# Patient Record
Sex: Female | Born: 1990
Health system: Southern US, Community
[De-identification: ages and names within clinical notes are randomized; demographics above are authoritative.]

## PROBLEM LIST (undated history)

## (undated) ENCOUNTER — Inpatient Hospital Stay: Payer: Self-pay

## (undated) DIAGNOSIS — D649 Anemia, unspecified: Secondary | ICD-10-CM

## (undated) DIAGNOSIS — O039 Complete or unspecified spontaneous abortion without complication: Secondary | ICD-10-CM

## (undated) HISTORY — DX: Anemia, unspecified: D64.9

## (undated) HISTORY — PX: WISDOM TOOTH EXTRACTION: SHX21

## (undated) HISTORY — DX: Complete or unspecified spontaneous abortion without complication: O03.9

---
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
## 2010-08-03 ENCOUNTER — Emergency Department: Payer: Self-pay | Admitting: Emergency Medicine

## 2010-08-05 ENCOUNTER — Emergency Department: Payer: Self-pay | Admitting: Internal Medicine

## 2011-05-28 ENCOUNTER — Emergency Department: Payer: Self-pay

## 2011-05-28 LAB — PREGNANCY, URINE: Pregnancy Test, Urine: NEGATIVE m[IU]/mL

## 2012-07-23 ENCOUNTER — Emergency Department: Payer: Self-pay | Admitting: Emergency Medicine

## 2012-07-23 LAB — URINALYSIS, COMPLETE
Bacteria: NONE SEEN
Ketone: NEGATIVE
Nitrite: NEGATIVE
Ph: 9 (ref 4.5–8.0)
Protein: NEGATIVE
RBC,UR: 1 /HPF (ref 0–5)
Specific Gravity: 1.019 (ref 1.003–1.030)
Squamous Epithelial: NONE SEEN
WBC UR: 8 /HPF (ref 0–5)

## 2013-11-05 ENCOUNTER — Emergency Department: Payer: Self-pay | Admitting: Emergency Medicine

## 2013-11-05 LAB — CBC
HCT: 37.6 % (ref 35.0–47.0)
HGB: 12.4 g/dL (ref 12.0–16.0)
MCH: 31.2 pg (ref 26.0–34.0)
MCHC: 32.9 g/dL (ref 32.0–36.0)
MCV: 95 fL (ref 80–100)
Platelet: 255 10*3/uL (ref 150–440)
RBC: 3.97 10*6/uL (ref 3.80–5.20)
RDW: 12.7 % (ref 11.5–14.5)
WBC: 9.3 10*3/uL (ref 3.6–11.0)

## 2013-11-05 LAB — HCG, QUANTITATIVE, PREGNANCY: Beta Hcg, Quant.: 37768 m[IU]/mL — ABNORMAL HIGH

## 2013-12-22 ENCOUNTER — Encounter: Payer: Self-pay | Admitting: Maternal & Fetal Medicine

## 2014-04-24 DIAGNOSIS — O039 Complete or unspecified spontaneous abortion without complication: Secondary | ICD-10-CM

## 2014-04-24 HISTORY — DX: Complete or unspecified spontaneous abortion without complication: O03.9

## 2014-05-03 ENCOUNTER — Inpatient Hospital Stay: Payer: Self-pay

## 2014-07-17 LAB — SURGICAL PATHOLOGY

## 2014-08-01 NOTE — H&P (Signed)
L&D Evaluation:  History Expanded:  HPI 24 yo G1P0 at 2332 weeks, Prenatal Care at [facility], no compl this pregnancy, denies recent trauma/infection/drug use/hypertension, presents w contraction type pain and no FHR by doppler or Ultrasound x2 examiners.  No vaginal bleeding, ROM, d/c, fever.  Patient does have h/o marijuana use in early pregnancy, denies recent use.  Patient worked today and had no fall or other problem or difference today. Patient and significant other visibly upset by news.   Blood Type (Maternal) O positive   Group B Strep Results Maternal (Result >5wks must be treated as unknown) unknown/result > 5 weeks ago   Presents with contractions   Patient's Medical History iron def anemia   Patient's Surgical History none   Medications Pre Natal Vitamins   Allergies NKDA   Social History past MJ use reported and on early drug screen   Family History Non-Contributory   ROS:  ROS All systems were reviewed.  HEENT, CNS, GI, GU, Respiratory, CV, Renal and Musculoskeletal systems were found to be normal.   Exam:  Vital Signs stable  100/60   General no apparent distress   Mental Status upset and in pain   Abdomen gravid, tender with contractions   Estimated Fetal Weight Average for gestational age   Back no CVAT   Edema no edema   Pelvic no external lesions, 8/C/BBOW, Vtx   Mebranes Intact   FHT ABSENT, by doppler and by Ultrasound   Ucx irregular, every 5-10 min   Skin dry   Impression:  Impression IUFD 32 weeks, no immediately known risk factor or etiology   Plan:  Comments Pt is counseled on the concerns relating to fetal loss. While some losses are readily explainable by immediate circumstances or symptoms, often times it is difficult to assess the true cause of a fetal demise.  She will be offered testing to further ellicit the etiology of her fetal demise, to include blood tests, urine tests, placental exam and testing, fetal cultures, and autopsy.   She will be managed with appropriate medications to facilitate delivery in a safe and humane fashion.  Every effort will be made to provide a quiet, peaceful, and compassionate transition.   Electronic Signatures: Letitia LibraHarris, Kersten Salmons Paul (MD)  (Signed 10-Feb-16 22:56)  Authored: L&D Evaluation   Last Updated: 10-Feb-16 22:56 by Letitia LibraHarris, Ronan Duecker Paul (MD)

## 2015-01-15 ENCOUNTER — Ambulatory Visit (INDEPENDENT_AMBULATORY_CARE_PROVIDER_SITE_OTHER): Payer: 59 | Admitting: Family Medicine

## 2015-01-15 ENCOUNTER — Encounter: Payer: Self-pay | Admitting: Family Medicine

## 2015-01-15 VITALS — BP 106/71 | HR 76 | Temp 99.1°F | Ht 64.4 in | Wt 130.0 lb

## 2015-01-15 DIAGNOSIS — Z8759 Personal history of other complications of pregnancy, childbirth and the puerperium: Secondary | ICD-10-CM | POA: Insufficient documentation

## 2015-01-15 DIAGNOSIS — Z13220 Encounter for screening for lipoid disorders: Secondary | ICD-10-CM

## 2015-01-15 DIAGNOSIS — Z8742 Personal history of other diseases of the female genital tract: Secondary | ICD-10-CM

## 2015-01-15 DIAGNOSIS — N912 Amenorrhea, unspecified: Secondary | ICD-10-CM

## 2015-01-15 DIAGNOSIS — D649 Anemia, unspecified: Secondary | ICD-10-CM

## 2015-01-15 HISTORY — DX: Anemia, unspecified: D64.9

## 2015-01-15 NOTE — Assessment & Plan Note (Signed)
Was anemic when she was pregnant. Will check CBC and iron studies today and treat as needed. Has also been very fatigued- will check CMP and TSH. Await results.

## 2015-01-15 NOTE — Assessment & Plan Note (Signed)
Will send to GYN. Concerned about ability to have children and hormone levels as well as recurrent BV. Referral generated today.

## 2015-01-15 NOTE — Progress Notes (Signed)
BP 106/71 mmHg  Pulse 76  Temp(Src) 99.1 F (37.3 C)  Ht 5' 4.4" (1.636 m)  Wt 130 lb (58.968 kg)  BMI 22.03 kg/m2  SpO2 98%  LMP 12/08/2014 (Approximate)   Subjective:    Patient ID: Brittany Page, female    DOB: 1990/12/31, 24 y.o.   MRN: [id number]  HPI: Brittany Page is a 24 y.o. female who presents today to establish care.   Chief Complaint  Patient presents with  . Fatigue    Low Hemoglobin  . Menstrual Problem    Patient is concerned about her hormones, she had a vey late term miscarriage. She has not had had regular periods since being on birth control.   . Vaginal Discharge    Patient has recurrent BV, she is wondering if she could be allergic to her boyfriend's sperm   ANEMIA- has been dealing with it for a long time. Has pretty heavy periods, has to go home because she bled through everything.  Anemia status: stable Etiology of anemia: unknown Duration of anemia treatment: Not on anything Iron supplementation side effects: no Severity of anemia: unknown Fatigue: yes Decreased exercise tolerance: no  Dyspnea on exertion: yes Palpitations: yes Bleeding: yes Pica: no  Lost a baby at 32 weeks in February. Very concerned about whether she will be able to have a baby in the future. She is concerned about her hormones. States that she has not been sexually active because she thinks she is allergic to her boyfriend's semen, but has not had a period in over a month. She states that her periods have been regular since the miscarriage, but were good when she was on birth control. Irregular without the OCP. Not currently on birth control and not using protection when she does have sex. HIV screening just done at the health department.   Last pap smear in March- through the health department  Relevant past medical, surgical, family and social history reviewed and updated as indicated. Interim medical history since our last visit reviewed. Allergies and medications reviewed and  updated.  Review of Systems  Constitutional: Negative.   Respiratory: Negative.   Cardiovascular: Negative.   Gastrointestinal: Negative.   Genitourinary: Negative.   Skin: Negative.   Psychiatric/Behavioral: Negative.     Per HPI unless specifically indicated above     Objective:    BP 106/71 mmHg  Pulse 76  Temp(Src) 99.1 F (37.3 C)  Ht 5' 4.4" (1.636 m)  Wt 130 lb (58.968 kg)  BMI 22.03 kg/m2  SpO2 98%  LMP 12/08/2014 (Approximate)  Wt Readings from Last 3 Encounters:  01/15/15 130 lb (58.968 kg)    Physical Exam  Constitutional: She is oriented to person, place, and time. She appears well-developed and well-nourished. No distress.  HENT:  Head: Normocephalic and atraumatic.  Right Ear: Hearing normal.  Left Ear: Hearing normal.  Nose: Nose normal.  Eyes: Conjunctivae and lids are normal. Right eye exhibits no discharge. Left eye exhibits no discharge. No scleral icterus.  Cardiovascular: Normal rate, regular rhythm, normal heart sounds and intact distal pulses.  Exam reveals no gallop and no friction rub.   No murmur heard. Pulmonary/Chest: Effort normal and breath sounds normal. No respiratory distress. She has no wheezes. She has no rales. She exhibits no tenderness.  Abdominal: Bowel sounds are normal. She exhibits no distension and no mass. There is no tenderness. There is no rebound and no guarding.  Musculoskeletal: Normal range of motion.  Neurological: She is alert and oriented  to person, place, and time.  Skin: Skin is warm, dry and intact. No rash noted. No erythema. No pallor.  Psychiatric: She has a normal mood and affect. Her speech is normal and behavior is normal. Judgment and thought content normal. Cognition and memory are normal.  Nursing note and vitals reviewed.   Results for orders placed or performed in visit on 05/03/14  Surgical pathology  Result Value Ref Range   SURGICAL PATHOLOGY      Surgical Procedure CASE: [id number] PATIENT:  Brittany Page Surgical Pathology Report     SPECIMEN SUBMITTED: A. Placenta, third trimester  CLINICAL HISTORY: None provided  PRE-OPERATIVE DIAGNOSIS: 32 week IUFD  POST-OPERATIVE DIAGNOSIS: None provided     DIAGNOSIS: A. PLACENTA, THIRD TRIMESTER; VAGINAL DELIVERY: - PALE, THIRD TRIMESTER DISCOID PLACENTA ASSOCIATED WITH INTRAUTERINE FETAL DEMISE. 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 - WEIGHT 304 G, APPROXIMATELY 15th PERCENTILE FOR GESTATIONAL AGE OF [redacted] WEEKS. - THREE VESSEL UMBILICAL CORD WITH ECCENTRIC INSERTION. - FOCAL ACUTE CHORIOAMNIONITIS.  Comment: Fetal thrombotic vasculopathy can be associated with cord abnormalities and intermittent cord obstruction. In this case, the hyper-coiled umbilical cord most likely led to compromised vascular flow in the umbilical vessels.   GROSS DESCRIPTION: A. The specimen is received in a formalin-fil led container labeled with the patient's name and placenta.  Weight: 304 grams Dimensions: 16 x 14.5 x 2.5 cm Shape: Roughly circular Accessory lobes: None Membranes: Pink purple to tan slightly cloudy and thickened. There is a circular thickened tan brown raised area in the membranes that measures 2.8 x 2 cm in greatest dimensions, of unknown significance. Umbilical cord:      Length -48 cm in length x 0.8-1.5 cm in diameter      Number of vessels -3      Insertion -eccentric      Distance of insertion from margin -5 cm      Other findings -the umbilical cord is hypercoiled markedly discolored purple and congested for its entire length Fetal surface: Gray purple with a vague small amount of green discolorationo Meconium staining Maternal surface: Intact with a minimal amount of adherent blood clot. Comments: Multiple sections through the placental parenchyma reveals a pale pink appearance with no masses grossly noted  Block summary: 1 - umbilical cord 2 -  memb rane rolls 3-4 placental parenchyma    Final Diagnosis performed by Elijah Birkara Rubinas, MD.  Electronically signed 05/05/2014 11:06:38AM    The electronic signature indicates that the named Attending Pathologist has evaluated the specimen  Technical component performed at [facility], [address], [place], [place] [address] Lab: [phone] Dir: Titus DubinWilliam F. Cato MulliganHancock, MD  Professional component performed at [facility], [facility], [address], [place], [place] [address] Lab: [phone] Dir: Georgiann Cockerara C. Oneita Krasubinas, MD        Assessment & Plan:   Problem List Items Addressed This Visit      Other   Anemia - Primary    Was anemic when she was pregnant. Will check CBC and iron studies today and treat as needed. Has also been very fatigued- will check CMP and TSH. Await results.       Relevant Orders   CBC With Differential/Platelet   Comprehensive metabolic panel   Iron and TIBC   Ferritin   Miscarriage within last 12 months    Will send to GYN. Concerned about ability to have children and hormone levels as well as recurrent BV. Referral generated today.  Relevant Orders   Comprehensive metabolic panel   Ambulatory referral to Gynecology    Other Visit Diagnoses    Amenorrhea        Urine pregnancy negative today.     Relevant Orders    Comprehensive metabolic panel    TSH    Pregnancy, urine    UA/M w/rflx Culture, Routine    Screening for cholesterol level        Will check levels today. Non-fasting, if quite elevated, will recheck when she is fasting.     Relevant Orders    Lipid Panel w/o Chol/HDL Ratio        Follow up plan: Return 2-3 months PE, for Records release health department.

## 2015-01-16 LAB — COMPREHENSIVE METABOLIC PANEL
ALT: 12 IU/L (ref 0–32)
AST: 14 IU/L (ref 0–40)
Albumin/Globulin Ratio: 1.3 (ref 1.1–2.5)
Albumin: 4.3 g/dL (ref 3.5–5.5)
Alkaline Phosphatase: 62 IU/L (ref 39–117)
BUN/Creatinine Ratio: 15 (ref 8–20)
BUN: 11 mg/dL (ref 6–20)
Bilirubin Total: 0.2 mg/dL (ref 0.0–1.2)
CALCIUM: 9.3 mg/dL (ref 8.7–10.2)
CO2: 22 mmol/L (ref 18–29)
CREATININE: 0.74 mg/dL (ref 0.57–1.00)
Chloride: 102 mmol/L (ref 97–106)
GFR calc Af Amer: 130 mL/min/{1.73_m2} (ref >59)
GFR, EST NON AFRICAN AMERICAN: 113 mL/min/{1.73_m2} (ref >59)
Globulin, Total: 3.2 g/dL (ref 1.5–4.5)
Glucose: 88 mg/dL (ref 65–99)
Potassium: 3.9 mmol/L (ref 3.5–5.2)
SODIUM: 143 mmol/L (ref 136–144)
Total Protein: 7.5 g/dL (ref 6.0–8.5)

## 2015-01-16 LAB — LIPID PANEL W/O CHOL/HDL RATIO
CHOLESTEROL TOTAL: 150 mg/dL (ref 100–199)
HDL: 52 mg/dL (ref >39)
LDL Calculated: 71 mg/dL (ref 0–99)
Triglycerides: 135 mg/dL (ref 0–149)
VLDL CHOLESTEROL CAL: 27 mg/dL (ref 5–40)

## 2015-01-16 LAB — TSH: TSH: 0.738 u[IU]/mL (ref 0.450–4.500)

## 2015-01-16 LAB — CBC WITH DIFFERENTIAL/PLATELET
HEMATOCRIT: 37.6 % (ref 34.0–46.6)
HEMOGLOBIN: 13.2 g/dL (ref 11.1–15.9)
LYMPHS ABS: 3.8 10*3/uL — AB (ref 0.7–3.1)
Lymphs: 40 %
MCH: 32.8 pg (ref 26.6–33.0)
MCHC: 35.1 g/dL (ref 31.5–35.7)
MCV: 93 fL (ref 79–97)
MID (Absolute): 0.9 10*3/uL (ref 0.1–1.6)
MID: 10 %
NEUTROS PCT: 50 %
Neutrophils Absolute: 4.7 10*3/uL (ref 1.4–7.0)
Platelets: 239 10*3/uL (ref 150–379)
RBC: 4.03 x10E6/uL (ref 3.77–5.28)
RDW: 44.9 % — ABNORMAL HIGH (ref 12.3–15.4)
WBC: 9.4 10*3/uL (ref 3.4–10.8)

## 2015-01-16 LAB — FERRITIN: Ferritin: 101 ng/mL (ref 15–150)

## 2015-01-16 LAB — PREGNANCY, URINE: PREG TEST UR: NEGATIVE

## 2015-01-16 LAB — IRON AND TIBC
IRON: 51 ug/dL (ref 27–159)
Iron Saturation: 18 % (ref 15–55)
Total Iron Binding Capacity: 285 ug/dL (ref 250–450)
UIBC: 234 ug/dL (ref 131–425)

## 2015-01-16 LAB — UA/M W/RFLX CULTURE, ROUTINE
BILIRUBIN UA: NEGATIVE
GLUCOSE, UA: NEGATIVE
Leukocytes, UA: NEGATIVE
NITRITE UA: NEGATIVE
RBC UA: NEGATIVE
SPEC GRAV UA: 1.015 (ref 1.005–1.030)
Urobilinogen, Ur: 1 mg/dL (ref 0.2–1.0)
pH, UA: 8.5 — ABNORMAL HIGH (ref 5.0–7.5)

## 2015-01-16 LAB — MICROSCOPIC EXAMINATION

## 2015-01-17 ENCOUNTER — Telehealth: Payer: Self-pay | Admitting: Family Medicine

## 2015-01-17 NOTE — Telephone Encounter (Signed)
Please let Brittany Page know that her labs all came back nice and normal. Nothing to worry about. Her urine pH was a little off- which might be why she keeps getting BV, she should drink a bit more acidic beverages (juice, lemonade) and that might help

## 2015-01-17 NOTE — Telephone Encounter (Signed)
Patient notified

## 2015-02-05 ENCOUNTER — Telehealth: Payer: Self-pay | Admitting: Family Medicine

## 2015-02-05 NOTE — Telephone Encounter (Signed)
Pt would like to come in regarding vaginal itching. She thinks it may be a yeast infection but is unsure. appt scheduled tomorrow @4 .

## 2015-02-06 ENCOUNTER — Ambulatory Visit (INDEPENDENT_AMBULATORY_CARE_PROVIDER_SITE_OTHER): Payer: 59 | Admitting: Family Medicine

## 2015-02-06 ENCOUNTER — Encounter: Payer: Self-pay | Admitting: Family Medicine

## 2015-02-06 VITALS — BP 117/79 | HR 79 | Temp 98.1°F | Ht 64.6 in | Wt 133.0 lb

## 2015-02-06 DIAGNOSIS — L298 Other pruritus: Secondary | ICD-10-CM

## 2015-02-06 DIAGNOSIS — N760 Acute vaginitis: Secondary | ICD-10-CM

## 2015-02-06 DIAGNOSIS — B9689 Other specified bacterial agents as the cause of diseases classified elsewhere: Secondary | ICD-10-CM

## 2015-02-06 DIAGNOSIS — A499 Bacterial infection, unspecified: Secondary | ICD-10-CM

## 2015-02-06 DIAGNOSIS — N898 Other specified noninflammatory disorders of vagina: Secondary | ICD-10-CM

## 2015-02-06 MED ORDER — METRONIDAZOLE 500 MG PO TABS: 500.0000 mg | ORAL_TABLET | Freq: Two times a day (BID) | ORAL | Status: DC

## 2015-02-06 MED ORDER — CVS PROBIOTIC PO CAPS: ORAL_CAPSULE | ORAL | Status: DC

## 2015-02-06 NOTE — Progress Notes (Signed)
BP 117/79 mmHg  Pulse 79  Temp(Src) 98.1 F (36.7 C)  Ht 5' 4.6" (1.641 m)  Wt 133 lb (60.328 kg)  BMI 22.40 kg/m2  SpO2 97%  LMP 12/08/2014 (Approximate)   Subjective:    Patient ID: Brittany Page, female    DOB: 09/10/1990, 23 y.o.   MRN: [id number]  HPI: Brittany Page is a 24 y.o. female  Chief Complaint  Patient presents with  . Vaginal Itching   VAGINAL itching Duration: couple of weeks Discharge description: none  Pruritus: yes Dysuria: no Malodorous: no Urinary frequency: no Fevers: no Abdominal pain: no  Sexual activity: practicing safe sex History of sexually transmitted diseases: no Recent antibiotic use: no Context: recurrent BV  Treatments attempted: none  Relevant past medical, surgical, family and social history reviewed and updated as indicated. Interim medical history since our last visit reviewed. Allergies and medications reviewed and updated.  Review of Systems  Constitutional: Negative.   Respiratory: Negative.   Cardiovascular: Negative.   Gastrointestinal: Negative.   Genitourinary: Negative.   Psychiatric/Behavioral: Negative.     Per HPI unless specifically indicated above     Objective:    BP 117/79 mmHg  Pulse 79  Temp(Src) 98.1 F (36.7 C)  Ht 5' 4.6" (1.641 m)  Wt 133 lb (60.328 kg)  BMI 22.40 kg/m2  SpO2 97%  LMP 12/08/2014 (Approximate)  Wt Readings from Last 3 Encounters:  02/06/15 133 lb (60.328 kg)  01/15/15 130 lb (58.968 kg)    Physical Exam  Constitutional: She is oriented to person, place, and time. She appears well-developed and well-nourished. No distress.  HENT:  Head: Normocephalic and atraumatic.  Right Ear: Hearing normal.  Left Ear: Hearing normal.  Nose: Nose normal.  Eyes: Conjunctivae and lids are normal. Right eye exhibits no discharge. Left eye exhibits no discharge. No scleral icterus.  Pulmonary/Chest: Effort normal. No respiratory distress.  Abdominal: Soft. Bowel sounds are normal. She  exhibits no distension and no mass. There is no tenderness. There is no rebound and no guarding.  Genitourinary: Uterus normal. No labial fusion. There is no rash, tenderness, lesion or injury on the right labia. There is no rash, tenderness, lesion or injury on the left labia. There is erythema and tenderness in the vagina. No bleeding in the vagina. No foreign body around the vagina. No signs of injury around the vagina. Vaginal discharge found.  Musculoskeletal: Normal range of motion.  Neurological: She is alert and oriented to person, place, and time.  Skin: Skin is warm, dry and intact. No rash noted. No erythema. No pallor.  Psychiatric: She has a normal mood and affect. Her speech is normal and behavior is normal. Judgment and thought content normal. Cognition and memory are normal.  Nursing note and vitals reviewed.   Results for orders placed or performed in visit on 01/15/15  Microscopic Examination  Result Value Ref Range   WBC, UA 0-5 0 -  5 /hpf   RBC, UA 0-2 0 -  2 /hpf   Epithelial Cells (non renal) 0-10 0 - 10 /hpf   Mucus, UA Present (A) Not Estab.   Bacteria, UA Few None seen/Few  CBC With Differential/Platelet  Result Value Ref Range   WBC 9.4 3.4 - 10.8 x10E3/uL   RBC 4.03 3.77 - 5.28 x10E6/uL   Hemoglobin 13.2 11.1 - 15.9 g/dL   Hematocrit 11.9 14.7 - 46.6 %   MCV 93 79 - 97 fL   MCH 32.8 26.6 - 33.0 pg  MCHC 35.1 31.5 - 35.7 g/dL   RDW 09.844.9 (H) 11.912.3 - 14.715.4 %   Platelets 239 150 - 379 x10E3/uL   Neutrophils 50 %   Lymphs 40 %   MID 10 %   Neutrophils Absolute 4.7 1.4 - 7.0 x10E3/uL   Lymphocytes Absolute 3.8 (H) 0.7 - 3.1 x10E3/uL   MID (Absolute) 0.9 0.1 - 1.6 X10E3/uL  Comprehensive metabolic panel  Result Value Ref Range   Glucose 88 65 - 99 mg/dL   BUN 11 6 - 20 mg/dL   Creatinine, Ser 8.290.74 0.57 - 1.00 mg/dL   GFR calc non Af Amer 113 >59 mL/min/1.73   GFR calc Af Amer 130 >59 mL/min/1.73   BUN/Creatinine Ratio 15 8 - 20   Sodium 143 136 - 144  mmol/L   Potassium 3.9 3.5 - 5.2 mmol/L   Chloride 102 97 - 106 mmol/L   CO2 22 18 - 29 mmol/L   Calcium 9.3 8.7 - 10.2 mg/dL   Total Protein 7.5 6.0 - 8.5 g/dL   Albumin 4.3 3.5 - 5.5 g/dL   Globulin, Total 3.2 1.5 - 4.5 g/dL   Albumin/Globulin Ratio 1.3 1.1 - 2.5   Bilirubin Total 0.2 0.0 - 1.2 mg/dL   Alkaline Phosphatase 62 39 - 117 IU/L   AST 14 0 - 40 IU/L   ALT 12 0 - 32 IU/L  Iron and TIBC  Result Value Ref Range   Total Iron Binding Capacity 285 250 - 450 ug/dL   UIBC 562234 130131 - 865425 ug/dL   Iron 51 27 - 784159 ug/dL   Iron Saturation 18 15 - 55 %  Ferritin  Result Value Ref Range   Ferritin 101 15 - 150 ng/mL  Lipid Panel w/o Chol/HDL Ratio  Result Value Ref Range   Cholesterol, Total 150 100 - 199 mg/dL   Triglycerides 696135 0 - 149 mg/dL   HDL 52 >29>39 mg/dL   VLDL Cholesterol Cal 27 5 - 40 mg/dL   LDL Calculated 71 0 - 99 mg/dL  TSH  Result Value Ref Range   TSH 0.738 0.450 - 4.500 uIU/mL  Pregnancy, urine  Result Value Ref Range   Preg Test, Ur Negative Negative  UA/M w/rflx Culture, Routine  Result Value Ref Range   Specific Gravity, UA 1.015 1.005 - 1.030   pH, UA 8.5 (H) 5.0 - 7.5   Color, UA Yellow Yellow   Appearance Ur Clear Clear   Leukocytes, UA Negative Negative   Protein, UA 1+ (A) Negative/Trace   Glucose, UA Negative Negative   Ketones, UA 1+ (A) Negative   RBC, UA Negative Negative   Bilirubin, UA Negative Negative   Urobilinogen, Ur 1.0 0.2 - 1.0 mg/dL   Nitrite, UA Negative Negative   Microscopic Examination See below:       Assessment & Plan:   Problem List Items Addressed This Visit    None    Visit Diagnoses    BV (bacterial vaginosis)    -  Primary    + clue cells. Tx with metronidazole. Rx for probiotic to help with recurrent BV. Call if not getting better or getting worse.     Relevant Medications    metroNIDAZOLE (FLAGYL) 500 MG tablet    Vaginal itching        Will check wet prep    Relevant Orders    WET PREP FOR TRICH,  YEAST, CLUE        Follow up plan: Return if symptoms worsen  or fail to improve.

## 2015-02-07 LAB — WET PREP FOR TRICH, YEAST, CLUE
Clue Cell Exam: POSITIVE — AB
TRICHOMONAS EXAM: NEGATIVE
Yeast Exam: NEGATIVE

## 2015-02-26 ENCOUNTER — Telehealth: Payer: Self-pay

## 2015-02-26 NOTE — Telephone Encounter (Signed)
Received a voicemail from patient that stated that she is still having the symptoms that she was having. She would like advice, she is not able to go to the GYN until January. Spoke with Dr.Johnson she is fine with seeing her on 03/29/14 @4 :15pm.

## 2015-02-27 ENCOUNTER — Encounter: Payer: Self-pay | Admitting: Family Medicine

## 2015-02-27 ENCOUNTER — Encounter: Payer: 59 | Admitting: Certified Nurse Midwife

## 2015-02-27 ENCOUNTER — Ambulatory Visit (INDEPENDENT_AMBULATORY_CARE_PROVIDER_SITE_OTHER): Payer: 59 | Admitting: Family Medicine

## 2015-02-27 VITALS — BP 113/76 | HR 106 | Temp 99.2°F | Wt 131.0 lb

## 2015-02-27 DIAGNOSIS — L298 Other pruritus: Secondary | ICD-10-CM | POA: Diagnosis not present

## 2015-02-27 DIAGNOSIS — A499 Bacterial infection, unspecified: Secondary | ICD-10-CM | POA: Diagnosis not present

## 2015-02-27 DIAGNOSIS — Z113 Encounter for screening for infections with a predominantly sexual mode of transmission: Secondary | ICD-10-CM | POA: Diagnosis not present

## 2015-02-27 DIAGNOSIS — N912 Amenorrhea, unspecified: Secondary | ICD-10-CM | POA: Diagnosis not present

## 2015-02-27 DIAGNOSIS — N760 Acute vaginitis: Secondary | ICD-10-CM

## 2015-02-27 DIAGNOSIS — B9689 Other specified bacterial agents as the cause of diseases classified elsewhere: Secondary | ICD-10-CM

## 2015-02-27 DIAGNOSIS — N898 Other specified noninflammatory disorders of vagina: Secondary | ICD-10-CM

## 2015-02-27 LAB — WET PREP FOR TRICH, YEAST, CLUE
CLUE CELL EXAM: POSITIVE — AB
TRICHOMONAS EXAM: NEGATIVE
YEAST EXAM: NEGATIVE

## 2015-02-27 LAB — MICROSCOPIC EXAMINATION: Epithelial Cells (non renal): >10 /hpf — AB (ref 0–10)

## 2015-02-27 LAB — PREGNANCY, URINE: PREG TEST UR: NEGATIVE

## 2015-02-27 MED ORDER — METRONIDAZOLE 0.75 % VA GEL: 1.0000 | Freq: Two times a day (BID) | VAGINAL | Status: DC

## 2015-02-27 NOTE — Progress Notes (Signed)
BP 113/76 mmHg  Pulse 106  Temp(Src) 99.2 F (37.3 C)  Wt 131 lb (59.421 kg)  SpO2 99%  LMP  (LMP Unknown)   Subjective:    Patient ID: Brittany Page, female    DOB: [birth year], 24 y.o.   MRN: [id number]  HPI: Brittany Page is a 24 y.o. female  Chief Complaint  Patient presents with  . Vaginal Discharge   VAGINAL DISCHARGE Duration: started yesterday at work Discharge description: doesn't see any discharge, but felt running out clear  Pruritus: no Dysuria: no Malodorous: yes Urinary frequency: yes Fevers: no Abdominal pain: no  Sexual activity: concerned about STIs History of sexually transmitted diseases: no Recent antibiotic use: yes Context: better and recurrent BV  Treatments attempted: none  Hasn't had a period in over a month. Had unprotected sex last time. Would like to be tested.  Relevant past medical, surgical, family and social history reviewed and updated as indicated. Interim medical history since our last visit reviewed. Allergies and medications reviewed and updated.  Review of Systems  Constitutional: Negative.   Respiratory: Negative.   Cardiovascular: Negative.   Gastrointestinal: Negative.   Genitourinary: Positive for dysuria and vaginal discharge. Negative for urgency, frequency, hematuria, flank pain, decreased urine volume, vaginal bleeding, enuresis, difficulty urinating, genital sores, vaginal pain, menstrual problem, pelvic pain and dyspareunia.  Psychiatric/Behavioral: Negative.     Per HPI unless specifically indicated above     Objective:    BP 113/76 mmHg  Pulse 106  Temp(Src) 99.2 F (37.3 C)  Wt 131 lb (59.421 kg)  SpO2 99%  LMP  (LMP Unknown)  Wt Readings from Last 3 Encounters:  02/27/15 131 lb (59.421 kg)  02/06/15 133 lb (60.328 kg)  01/15/15 130 lb (58.968 kg)    Physical Exam  Constitutional: She is oriented to person, place, and time. She appears well-developed and well-nourished. No distress.  HENT:  Head:  Normocephalic and atraumatic.  Right Ear: Hearing normal.  Left Ear: Hearing normal.  Nose: Nose normal.  Eyes: Conjunctivae and lids are normal. Right eye exhibits no discharge. Left eye exhibits no discharge. No scleral icterus.  Pulmonary/Chest: Effort normal. No respiratory distress.  Abdominal: Soft. Bowel sounds are normal. She exhibits no distension and no mass. There is no tenderness. There is no rebound and no guarding. Hernia confirmed negative in the right inguinal area and confirmed negative in the left inguinal area.  Genitourinary: Uterus normal. No labial fusion. There is no rash, tenderness, lesion or injury on the right labia. There is no rash, tenderness, lesion or injury on the left labia. No erythema, tenderness or bleeding in the vagina. No foreign body around the vagina. No signs of injury around the vagina. Vaginal discharge found.  Musculoskeletal: Normal range of motion.  Lymphadenopathy:       Right: No inguinal adenopathy present.       Left: No inguinal adenopathy present.  Neurological: She is alert and oriented to person, place, and time.  Skin: Skin is warm, dry and intact. No rash noted. No erythema. No pallor.  Psychiatric: She has a normal mood and affect. Her speech is normal and behavior is normal. Judgment and thought content normal. Cognition and memory are normal.  Nursing note and vitals reviewed.   Results for orders placed or performed in visit on 02/27/15  Microscopic Examination  Result Value Ref Range   WBC, UA 0-5 0 -  5 /hpf   RBC, UA 0-2 0 -  2 /hpf   Epithelial  Cells (non renal) >10 (A) 0 - 10 /hpf   Mucus, UA Present Not Estab.   Bacteria, UA Few None seen/Few  WET PREP FOR TRICH, YEAST, CLUE  Result Value Ref Range   Trichomonas Exam Negative Negative   Yeast Exam Negative Negative   Clue Cell Exam Positive (A) Negative  Pregnancy, urine  Result Value Ref Range   Preg Test, Ur Negative Negative  UA/M w/rflx Culture, Routine  Result  Value Ref Range   Specific Gravity, UA 1.020 1.005 - 1.030   pH, UA 7.0 5.0 - 7.5   Color, UA Yellow Yellow   Appearance Ur Cloudy (A) Clear   Leukocytes, UA Trace (A) Negative   Protein, UA Trace Negative/Trace   Glucose, UA Negative Negative   Ketones, UA Trace (A) Negative   RBC, UA Negative Negative   Bilirubin, UA Negative Negative   Urobilinogen, Ur 1.0 0.2 - 1.0 mg/dL   Nitrite, UA Negative Negative   Microscopic Examination See below:    Urinalysis Reflex Comment       Assessment & Plan:   Problem List Items Addressed This Visit    None    Visit Diagnoses    BV (bacterial vaginosis)    -  Primary    Recurrent. Will try gel instead of pills this time. Will check in in 10 days to see if she's better. May need to try clindamycin if not better.    Amenorrhea        Will check for pregnancy, negative today.    Relevant Orders    Pregnancy, urine (Completed)    Routine screening for STI (sexually transmitted infection)        Will test today, labs drawn. Await results.     Relevant Orders    HIV antibody    WET PREP FOR TRICH, YEAST, CLUE    Hepatitis, Acute    RPR    GC/Chlamydia Probe Amp    HSV(herpes simplex vrs) 1+2 ab-IgG    UA/M w/rflx Culture, Routine (Completed)    Vaginal itching        BV on wet prep.     Relevant Orders    WET PREP FOR TRICH, YEAST, CLUE    UA/M w/rflx Culture, Routine (Completed)        Follow up plan: Return 10 days for follow up, for BV.

## 2015-02-28 ENCOUNTER — Telehealth: Payer: Self-pay

## 2015-02-28 LAB — HSV(HERPES SIMPLEX VRS) I + II AB-IGG: HSV 2 GLYCOPROTEIN G AB, IGG: 16 {index} — AB (ref 0.00–0.90)

## 2015-02-28 LAB — RPR: RPR Ser Ql: NONREACTIVE

## 2015-02-28 LAB — HEPATITIS PANEL, ACUTE
HEP A IGM: NEGATIVE
Hep B C IgM: NEGATIVE
Hep C Virus Ab: <0.1 s/co ratio (ref 0.0–0.9)
Hepatitis B Surface Ag: NEGATIVE

## 2015-02-28 LAB — GC/CHLAMYDIA PROBE AMP
Chlamydia trachomatis, NAA: NEGATIVE
Neisseria gonorrhoeae by PCR: NEGATIVE

## 2015-02-28 LAB — HIV ANTIBODY (ROUTINE TESTING W REFLEX): HIV SCREEN 4TH GENERATION: NONREACTIVE

## 2015-02-28 NOTE — Telephone Encounter (Signed)
Patient states that she cannot afford the vaginal gel that you prescribed as it is $130.  Patient requests that you call in [place] [facility] to [facility] [place].  Patient can be reached at [phone].

## 2015-03-01 LAB — UA/M W/RFLX CULTURE, ROUTINE: Organism ID, Bacteria: NO GROWTH

## 2015-03-01 NOTE — Telephone Encounter (Signed)
Forward to provider

## 2015-03-02 ENCOUNTER — Telehealth: Payer: Self-pay | Admitting: Family Medicine

## 2015-03-02 MED ORDER — CLINDAMYCIN HCL 300 MG PO CAPS: 300.0000 mg | ORAL_CAPSULE | Freq: Two times a day (BID) | ORAL | Status: DC

## 2015-03-02 NOTE — Telephone Encounter (Signed)
Please let her know that I sent through a different antibiotic for her to try. There are not any other gels that are cheaper.

## 2015-03-02 NOTE — Addendum Note (Signed)
Addended by: Dorcas CarrowJOHNSON, Cesia Orf P on: 03/02/2015 03:05 PM   Modules accepted: Orders, Medications

## 2015-03-02 NOTE — Telephone Encounter (Signed)
Please let her know that all her labs came back normal. Thanks! 

## 2015-03-06 NOTE — Telephone Encounter (Signed)
Patient notified

## 2015-03-09 ENCOUNTER — Ambulatory Visit: Payer: 59 | Admitting: Family Medicine

## 2015-03-09 ENCOUNTER — Encounter: Payer: Self-pay | Admitting: Family Medicine

## 2015-03-09 VITALS — BP 107/72 | HR 92 | Temp 97.9°F | Ht 64.0 in | Wt 133.8 lb

## 2015-03-09 NOTE — Progress Notes (Signed)
Patient not seen today as she is on her period and cannot discuss discharge. Will return PRN.

## 2015-03-20 ENCOUNTER — Telehealth: Payer: Self-pay

## 2015-03-20 DIAGNOSIS — B9689 Other specified bacterial agents as the cause of diseases classified elsewhere: Secondary | ICD-10-CM

## 2015-03-20 DIAGNOSIS — N760 Acute vaginitis: Principal | ICD-10-CM

## 2015-03-20 NOTE — Telephone Encounter (Signed)
Received a fax from the nurse hotline about patient having vaginal pain, itching. Spoke with Dr.Johnson, she states that the patient has recurrent BV and that she needs to be seen by GYN. Referral Placed.

## 2015-03-25 NOTE — L&D Delivery Note (Signed)
Delivery Summary for [employer]issa Page  Labor Events:   Preterm labor:   Rupture date:   Rupture time:   Rupture type: Artificial  Fluid Color: Moderate Meconium  Induction:   Augmentation:   Complications:   Cervical ripening:          Delivery:   Episiotomy:   Lacerations:   Repair suture:   Repair # of packets:   Blood loss (ml): 300   Information for the patient's newborn:  Bartholomew Boardsewby, Boy [name] [[id number]][[id number]]    Delivery 03/18/2016 10:55 PM by  Vaginal, Spontaneous Delivery Sex:  female Gestational Age: [id number] Delivery Clinician:   Living?:         APGARS  One minute Five minutes Ten minutes  Skin color:        Heart rate:        Grimace:        Muscle tone:        Breathing:        Totals: 7  9      Presentation/position:      Resuscitation:   Cord information:    Disposition of cord blood:     Blood gases sent?  Complications:   Placenta: Delivered:       appearance Newborn Measurements: Weight: 7 lb 10.8 oz (3480 g)  Height: 19.69"  Head circumference:    Chest circumference:    Other providers:    Additional  information: Forceps:   Vacuum:   Breech:   Observed anomalies       Delivery Note At 10:55 PM a viable and healthy female was delivered via Vaginal, Spontaneous Delivery (Presentation: cephalic; LOA position).  APGAR: 7, 9; weight 7 lb 10.8 oz (3480 g).   Placenta status: removed spontaneously, intact.  Cord: 3-vessel with the following complications: None.  Cord pH: not obtained.  Anesthesia:   Episiotomy: None Lacerations: 1st degree;Vaginal Suture Repair: 3.0 vicryl Est. Blood Loss (mL): 300  Mom to postpartum.  Baby to Couplet care / Skin to Skin.  Brittany Page 03/18/2016, 11:30 PM      Brittany LaserAnika Justine Cossin, MD [facility]

## 2015-04-09 ENCOUNTER — Encounter: Payer: 59 | Admitting: Certified Nurse Midwife

## 2015-04-18 ENCOUNTER — Telehealth: Payer: Self-pay | Admitting: Family Medicine

## 2015-04-18 NOTE — Telephone Encounter (Signed)
Pt called stated she has BV again. Pt wants to know if she can get a refill on Clindamycin. Pharm is [facility] in [place]. Thanks.

## 2015-04-19 MED ORDER — CLINDAMYCIN HCL 300 MG PO CAPS: 300.0000 mg | ORAL_CAPSULE | Freq: Two times a day (BID) | ORAL | Status: DC

## 2015-04-19 NOTE — Telephone Encounter (Signed)
Patient is scheduled to see them on February 7,2017.

## 2015-04-19 NOTE — Telephone Encounter (Signed)
Rx sent to her pharmacy. Can we find out if she has see GYN yet?

## 2015-05-01 ENCOUNTER — Encounter: Payer: 59 | Admitting: Obstetrics and Gynecology

## 2015-05-24 ENCOUNTER — Encounter: Payer: 59 | Admitting: Obstetrics and Gynecology

## 2015-05-24 ENCOUNTER — Encounter: Payer: Self-pay | Admitting: Obstetrics and Gynecology

## 2015-05-24 ENCOUNTER — Ambulatory Visit (INDEPENDENT_AMBULATORY_CARE_PROVIDER_SITE_OTHER): Payer: 59 | Admitting: Obstetrics and Gynecology

## 2015-05-24 VITALS — BP 113/70 | HR 78 | Ht 64.0 in | Wt 129.6 lb

## 2015-05-24 DIAGNOSIS — N760 Acute vaginitis: Secondary | ICD-10-CM

## 2015-05-24 DIAGNOSIS — Z8759 Personal history of other complications of pregnancy, childbirth and the puerperium: Secondary | ICD-10-CM | POA: Diagnosis not present

## 2015-05-24 DIAGNOSIS — B9689 Other specified bacterial agents as the cause of diseases classified elsewhere: Secondary | ICD-10-CM

## 2015-05-24 DIAGNOSIS — A499 Bacterial infection, unspecified: Secondary | ICD-10-CM

## 2015-05-24 MED ORDER — CLINDESSE 2 % VA CREA: 1.0000 | TOPICAL_CREAM | Freq: Two times a day (BID) | VAGINAL | Status: DC

## 2015-05-24 NOTE — Progress Notes (Signed)
Subjective:     Patient ID: Brittany Page, female   DOB: 06/22/1990, 25 y.o.   MRN: [id number]  HPI Reports pregnancy loss about a year ago at 8 months- at which time she delivered first child stillborn female with cord accident (knot in cord), afterwards starting having persistent BV; has been seen multiple times at ED and [facility] and treated with Flagyl and most recently Clindamycin PO with no resolution. Very frustrated and odor is persistent and itching occurs. Is now having regular monthly menses, and desires another pregnancy, but wants to get this cleared up first,  Review of Systems See above    Objective:   Physical Exam A&O x4 Well groomed female in no distress Blood pressure 113/70, pulse 78, height  (1.626 m), weight 129 lb 9.6 oz (58.786 kg), last menstrual period 05/03/2015. Pelvic exam: normal external genitalia, vulva, vagina, cervix, uterus and adnexa, VAGINA: normal appearing vagina with normal color and discharge, no lesions, vaginal discharge - white, copious and malodorous, WET MOUNT done - results: KOH done, clue cells, excessive bacteria, NuSwab sent for restistance testing.    Assessment:     H/O still born at 8 months from cord accident persistent BV     Plan:     RX for clindesse 2% to use tonight, then samples of hylafem to place vaginally once a week x 4 weeks, then return for recheck in 4 weeks.  Tysen Roesler [place], CNM

## 2015-05-24 NOTE — Patient Instructions (Signed)

## 2015-05-29 ENCOUNTER — Other Ambulatory Visit: Payer: Self-pay | Admitting: Obstetrics and Gynecology

## 2015-05-29 ENCOUNTER — Telehealth: Payer: Self-pay | Admitting: Obstetrics and Gynecology

## 2015-05-29 NOTE — Telephone Encounter (Signed)
Patient has a question s regarding how she should be using the vaginal cream and if she is supposed to be reusing the applicator.Thanks

## 2015-05-29 NOTE — Telephone Encounter (Signed)
It should have been a one time use medication with a prefilled applicator? Please clarify with patient what the pharmacy gave her, and if they gave her the generic then it is to be used nightly x 5 nights reusing the same applicator.

## 2015-05-29 NOTE — Telephone Encounter (Signed)
Notified pt she voiced understanding 

## 2015-05-29 NOTE — Telephone Encounter (Signed)
pls advise

## 2015-05-31 ENCOUNTER — Telehealth: Payer: Self-pay | Admitting: Obstetrics and Gynecology

## 2015-05-31 LAB — NUSWAB VAGINITIS PLUS (VG+)
ATOPOBIUM VAGINAE: HIGH {score} — AB
BVAB 2: HIGH {score} — AB
Candida albicans, NAA: NEGATIVE
Candida glabrata, NAA: NEGATIVE
Chlamydia trachomatis, NAA: NEGATIVE
Neisseria gonorrhoeae, NAA: NEGATIVE
TRICH VAG BY NAA: NEGATIVE

## 2015-05-31 NOTE — Telephone Encounter (Signed)
PT NEEDS A CALL BACK THERE IS SOMETHING GOING ON WITH THE CREAM THAT [facility] CALLED IN AND SHE NEEDS A CALL BACK ON DIRECTIONS.

## 2015-06-06 ENCOUNTER — Encounter: Payer: Self-pay | Admitting: Family Medicine

## 2015-06-06 ENCOUNTER — Ambulatory Visit (INDEPENDENT_AMBULATORY_CARE_PROVIDER_SITE_OTHER): Payer: 59 | Admitting: Family Medicine

## 2015-06-06 VITALS — BP 104/68 | HR 84 | Temp 98.2°F | Ht 64.0 in | Wt 127.0 lb

## 2015-06-06 DIAGNOSIS — J029 Acute pharyngitis, unspecified: Secondary | ICD-10-CM

## 2015-06-06 DIAGNOSIS — J309 Allergic rhinitis, unspecified: Secondary | ICD-10-CM | POA: Diagnosis not present

## 2015-06-06 MED ORDER — FEXOFENADINE HCL 180 MG PO TABS: 180.0000 mg | ORAL_TABLET | Freq: Every day | ORAL | Status: DC

## 2015-06-06 NOTE — Patient Instructions (Signed)
Allergic Rhinitis Allergic rhinitis is when the mucous membranes in the nose respond to allergens. Allergens are particles in the air that cause your body to have an allergic reaction. This causes you to release allergic antibodies. Through a chain of events, these eventually cause you to release histamine into the blood stream. Although meant to protect the body, it is this release of histamine that causes your discomfort, such as frequent sneezing, congestion, and an itchy, runny nose.  CAUSES Seasonal allergic rhinitis (hay fever) is caused by pollen allergens that may come from grasses, trees, and weeds. Year-round allergic rhinitis (perennial allergic rhinitis) is caused by allergens such as house dust mites, pet dander, and mold spores. SYMPTOMS  Nasal stuffiness (congestion).  Itchy, runny nose with sneezing and tearing of the eyes. DIAGNOSIS Your health care provider can help you determine the allergen or allergens that trigger your symptoms. If you and your health care provider are unable to determine the allergen, skin or blood testing may be used. Your health care provider will diagnose your condition after taking your health history and performing a physical exam. Your health care provider may assess you for other related conditions, such as asthma, pink eye, or an ear infection. TREATMENT Allergic rhinitis does not have a cure, but it can be controlled by:  Medicines that block allergy symptoms. These may include allergy shots, nasal sprays, and oral antihistamines.  Avoiding the allergen. Hay fever may often be treated with antihistamines in pill or nasal spray forms. Antihistamines block the effects of histamine. There are over-the-counter medicines that may help with nasal congestion and swelling around the eyes. Check with your health care provider before taking or giving this medicine. If avoiding the allergen or the medicine prescribed do not work, there are many new medicines  your health care provider can prescribe. Stronger medicine may be used if initial measures are ineffective. Desensitizing injections can be used if medicine and avoidance does not work. Desensitization is when a patient is given ongoing shots until the body becomes less sensitive to the allergen. Make sure you follow up with your health care provider if problems continue. HOME CARE INSTRUCTIONS It is not possible to completely avoid allergens, but you can reduce your symptoms by taking steps to limit your exposure to them. It helps to know exactly what you are allergic to so that you can avoid your specific triggers. SEEK MEDICAL CARE IF:  You have a fever.  You develop a cough that does not stop easily (persistent).  You have shortness of breath.  You start wheezing.  Symptoms interfere with normal daily activities.   This information is not intended to replace advice given to you by your health care provider. Make sure you discuss any questions you have with your health care provider.   Document Released: 12/03/2000 Document Revised: 03/31/2014 Document Reviewed: 11/15/2012 Elsevier Interactive Patient Education 2016 [employer]  

## 2015-06-06 NOTE — Progress Notes (Signed)
BP 104/68 mmHg  Pulse 84  Temp(Src) 98.2 F (36.8 C)  Ht 5\' 4"  (1.626 m)  Wt 127 lb (57.607 kg)  BMI 21.79 kg/m2  SpO2 99%  LMP 06/06/2015 (Exact Date)   Subjective:    Patient ID: Brittany Page, female    DOB: 04/28/1990, 25 y.o.   MRN: [id number]  HPI: Brittany Page is a 25 y.o. female  Chief Complaint  Patient presents with  . Sore Throat   UPPER RESPIRATORY TRACT INFECTION Duration: couple of days Worst symptom: sore throat Fever: no Cough: yes Shortness of breath: no Wheezing: no Chest pain: no Chest tightness: no Chest congestion: no Nasal congestion: no Runny nose: no Post nasal drip: yes Sneezing: no Sore throat: yes Swollen glands: no Sinus pressure: no Headache: no Face pain: no Toothache: no Ear pain: no  Ear pressure: no  Eyes red/itching:no Eye drainage/crusting: no  Vomiting: no Rash: no Fatigue: yes Sick contacts: yes Strep contacts: yes  Context: stable Recurrent sinusitis: no Relief with OTC cold/cough medications: no  Treatments attempted: none   Relevant past medical, surgical, family and social history reviewed and updated as indicated. Interim medical history since our last visit reviewed. Allergies and medications reviewed and updated.  Review of Systems  Constitutional: Negative.   HENT: Positive for postnasal drip and sore throat. Negative for congestion, dental problem, drooling, ear discharge, ear pain, facial swelling, hearing loss, mouth sores, nosebleeds, rhinorrhea, sinus pressure, sneezing, tinnitus, trouble swallowing and voice change.   Respiratory: Negative.   Cardiovascular: Negative.   Psychiatric/Behavioral: Negative.     Per HPI unless specifically indicated above     Objective:    BP 104/68 mmHg  Pulse 84  Temp(Src) 98.2 F (36.8 C)  Ht 5\' 4"  (1.626 m)  Wt 127 lb (57.607 kg)  BMI 21.79 kg/m2  SpO2 99%  LMP 06/06/2015 (Exact Date)  Wt Readings from Last 3 Encounters:  06/06/15 127 lb (57.607 kg)   05/24/15 129 lb 9.6 oz (58.786 kg)  03/09/15 133 lb 12.8 oz (60.691 kg)    Physical Exam  Constitutional: She is oriented to person, place, and time. She appears well-developed and well-nourished. No distress.  HENT:  Head: Normocephalic and atraumatic.  Right Ear: Hearing and external ear normal.  Left Ear: Hearing and external ear normal.  Nose: Mucosal edema and rhinorrhea present.  Mouth/Throat: Oropharynx is clear and moist. No oropharyngeal exudate.  Eyes: Conjunctivae, EOM and lids are normal. Pupils are equal, round, and reactive to light. Right eye exhibits no discharge. Left eye exhibits no discharge. No scleral icterus.  Neck: Normal range of motion. Neck supple. No JVD present. No tracheal deviation present. No thyromegaly present.  Cardiovascular: Normal rate, regular rhythm, normal heart sounds and intact distal pulses.  Exam reveals no gallop and no friction rub.   No murmur heard. Pulmonary/Chest: Effort normal and breath sounds normal. No stridor. No respiratory distress. She has no wheezes. She has no rales. She exhibits no tenderness.  Musculoskeletal: Normal range of motion.  Lymphadenopathy:    She has cervical adenopathy.  Neurological: She is alert and oriented to person, place, and time.  Skin: Skin is warm, dry and intact. No rash noted. She is not diaphoretic. No erythema. No pallor.  Psychiatric: She has a normal mood and affect. Her speech is normal and behavior is normal. Judgment and thought content normal. Cognition and memory are normal.  Nursing note and vitals reviewed.   Results for orders placed or performed in visit on  05/24/15  NuSwab Vaginitis Plus (VG+)  Result Value Ref Range   Atopobium vaginae High - 2 (A) Score   BVAB 2 High - 2 (A) Score   Megasphaera 1 Low - 0 Score   Candida albicans, NAA Negative Negative   Candida glabrata, NAA Negative Negative   Trich vag by NAA Negative Negative   Chlamydia trachomatis, NAA Negative Negative    Neisseria gonorrhoeae, NAA Negative Negative      Assessment & Plan:   Problem List Items Addressed This Visit    None    Visit Diagnoses    Allergic rhinitis, unspecified allergic rhinitis type    -  Primary    Will start allegra. Rest and fluids. Call if not getting better or getting worse.     Sore throat        Strep negative. Likely due to allergic rhinitis.     Relevant Orders    Rapid strep screen (not at [facility])        Follow up plan: Return if symptoms worsen or fail to improve.

## 2015-06-08 LAB — CULTURE, GROUP A STREP: STREP A CULTURE: NEGATIVE

## 2015-06-08 LAB — RAPID STREP SCREEN (MED CTR MEBANE ONLY): STREP GP A AG, IA W/REFLEX: NEGATIVE

## 2015-06-11 NOTE — Telephone Encounter (Signed)
Called pt several times NA

## 2015-07-23 ENCOUNTER — Telehealth: Payer: Self-pay | Admitting: *Deleted

## 2015-07-23 NOTE — Telephone Encounter (Signed)
Patient called and stated that Melody prescribed her a medication for Bacterial vaginosis. . Patient stated that Melody told her to use it for 5 days, but she only had enough for 3 days. Patient states that she needs another refill of that prescription. She also said that she did not have her menstrual cycle last month . Patient said she did take a pregnancy test a few weeks ago and it was negative. Patient is requesting a call back . Call back number is [phone]

## 2015-07-30 ENCOUNTER — Ambulatory Visit (INDEPENDENT_AMBULATORY_CARE_PROVIDER_SITE_OTHER): Payer: 59 | Admitting: Family Medicine

## 2015-07-30 ENCOUNTER — Telehealth: Payer: Self-pay | Admitting: Family Medicine

## 2015-07-30 ENCOUNTER — Encounter: Payer: Self-pay | Admitting: Family Medicine

## 2015-07-30 VITALS — BP 119/77 | HR 106 | Temp 98.3°F | Ht 63.3 in | Wt 130.0 lb

## 2015-07-30 DIAGNOSIS — N926 Irregular menstruation, unspecified: Secondary | ICD-10-CM | POA: Diagnosis not present

## 2015-07-30 DIAGNOSIS — Z23 Encounter for immunization: Secondary | ICD-10-CM

## 2015-07-30 DIAGNOSIS — N898 Other specified noninflammatory disorders of vagina: Secondary | ICD-10-CM | POA: Diagnosis not present

## 2015-07-30 DIAGNOSIS — Z3490 Encounter for supervision of normal pregnancy, unspecified, unspecified trimester: Secondary | ICD-10-CM

## 2015-07-30 LAB — WET PREP FOR TRICH, YEAST, CLUE
Clue Cell Exam: NEGATIVE
Trichomonas Exam: NEGATIVE
Yeast Exam: NEGATIVE

## 2015-07-30 LAB — PREGNANCY, URINE: Preg Test, Ur: POSITIVE — AB

## 2015-07-30 MED ORDER — PRENATAL VITAMIN 27-0.8 MG PO TABS: 1.0000 | ORAL_TABLET | Freq: Every day | ORAL | Status: DC

## 2015-07-30 NOTE — Telephone Encounter (Signed)
She wants someone to call her before her appt to tell her if she needs to come? Talking with the pt it was unclear exactly what she wanted please call and follow up. Thanks.

## 2015-07-30 NOTE — Patient Instructions (Addendum)
First Trimester of Pregnancy The first trimester of pregnancy is from week 1 until the end of week 12 (months 1 through 3). A week after a sperm fertilizes an egg, the egg will implant on the wall of the uterus. This embryo will begin to develop into a baby. Genes from you and your partner are forming the baby. The female genes determine whether the baby is a boy or a girl. At 6-8 weeks, the eyes and face are formed, and the heartbeat can be seen on ultrasound. At the end of 12 weeks, all the baby's organs are formed.  Now that you are pregnant, you will want to do everything you can to have a healthy baby. Two of the most important things are to get good prenatal care and to follow your health care provider's instructions. Prenatal care is all the medical care you receive before the baby's birth. This care will help prevent, find, and treat any problems during the pregnancy and childbirth. BODY CHANGES Your body goes through many changes during pregnancy. The changes vary from woman to woman.   You may gain or lose a couple of pounds at first.  You may feel sick to your stomach (nauseous) and throw up (vomit). If the vomiting is uncontrollable, call your health care provider.  You may tire easily.  You may develop headaches that can be relieved by medicines approved by your health care provider.  You may urinate more often. Painful urination may mean you have a bladder infection.  You may develop heartburn as a result of your pregnancy.  You may develop constipation because certain hormones are causing the muscles that push waste through your intestines to slow down.  You may develop hemorrhoids or swollen, bulging veins (varicose veins).  Your breasts may begin to grow larger and become tender. Your nipples may stick out more, and the tissue that surrounds them (areola) may become darker.  Your gums may bleed and may be sensitive to brushing and flossing.  Dark spots or blotches (chloasma,  mask of pregnancy) may develop on your face. This will likely fade after the baby is born.  Your menstrual periods will stop.  You may have a loss of appetite.  You may develop cravings for certain kinds of food.  You may have changes in your emotions from day to day, such as being excited to be pregnant or being concerned that something may go wrong with the pregnancy and baby.  You may have more vivid and strange dreams.  You may have changes in your hair. These can include thickening of your hair, rapid growth, and changes in texture. Some women also have hair loss during or after pregnancy, or hair that feels dry or thin. Your hair will most likely return to normal after your baby is born. WHAT TO EXPECT AT YOUR PRENATAL VISITS During a routine prenatal visit:  You will be weighed to make sure you and the baby are growing normally.  Your blood pressure will be taken.  Your abdomen will be measured to track your baby's growth.  The fetal heartbeat will be listened to starting around week 10 or 12 of your pregnancy.  Test results from any previous visits will be discussed. Your health care provider may ask you:  How you are feeling.  If you are feeling the baby move.  If you have had any abnormal symptoms, such as leaking fluid, bleeding, severe headaches, or abdominal cramping.  If you are using any tobacco products,   including cigarettes, chewing tobacco, and electronic cigarettes.  If you have any questions. Other tests that may be performed during your first trimester include:  Blood tests to find your blood type and to check for the presence of any previous infections. They will also be used to check for low iron levels (anemia) and Rh antibodies. Later in the pregnancy, blood tests for diabetes will be done along with other tests if problems develop.  Urine tests to check for infections, diabetes, or protein in the urine.  An ultrasound to confirm the proper growth  and development of the baby.  An amniocentesis to check for possible genetic problems.  Fetal screens for spina bifida and Down syndrome.  You may need other tests to make sure you and the baby are doing well.  HIV (human immunodeficiency virus) testing. Routine prenatal testing includes screening for HIV, unless you choose not to have this test. HOME CARE INSTRUCTIONS  Medicines  Follow your health care provider's instructions regarding medicine use. Specific medicines may be either safe or unsafe to take during pregnancy.  Take your prenatal vitamins as directed.  If you develop constipation, try taking a stool softener if your health care provider approves. Diet  Eat regular, well-balanced meals. Choose a variety of foods, such as meat or vegetable-based protein, fish, milk and low-fat dairy products, vegetables, fruits, and whole grain breads and cereals. Your health care provider will help you determine the amount of weight gain that is right for you.  Avoid raw meat and uncooked cheese. These carry germs that can cause birth defects in the baby.  Eating four or five small meals rather than three large meals a day may help relieve nausea and vomiting. If you start to feel nauseous, eating a few soda crackers can be helpful. Drinking liquids between meals instead of during meals also seems to help nausea and vomiting.  If you develop constipation, eat more high-fiber foods, such as fresh vegetables or fruit and whole grains. Drink enough fluids to keep your urine clear or pale yellow. Activity and Exercise  Exercise only as directed by your health care provider. Exercising will help you:  Control your weight.  Stay in shape.  Be prepared for labor and delivery.  Experiencing pain or cramping in the lower abdomen or low back is a good sign that you should stop exercising. Check with your health care provider before continuing normal exercises.  Try to avoid standing for long  periods of time. Move your legs often if you must stand in one place for a long time.  Avoid heavy lifting.  Wear low-heeled shoes, and practice good posture.  You may continue to have sex unless your health care provider directs you otherwise. Relief of Pain or Discomfort  Wear a good support bra for breast tenderness.   Take warm sitz baths to soothe any pain or discomfort caused by hemorrhoids. Use hemorrhoid cream if your health care provider approves.   Rest with your legs elevated if you have leg cramps or low back pain.  If you develop varicose veins in your legs, wear support hose. Elevate your feet for 15 minutes, 3-4 times a day. Limit salt in your diet. Prenatal Care  Schedule your prenatal visits by the twelfth week of pregnancy. They are usually scheduled monthly at first, then more often in the last 2 months before delivery.  Write down your questions. Take them to your prenatal visits.  Keep all your prenatal visits as directed by your   health care provider. Safety  Wear your seat belt at all times when driving.  Make a list of emergency phone numbers, including numbers for family, friends, the hospital, and police and fire departments. General Tips  Ask your health care provider for a referral to a local prenatal education class. Begin classes no later than at the beginning of month 6 of your pregnancy.  Ask for help if you have counseling or nutritional needs during pregnancy. Your health care provider can offer advice or refer you to specialists for help with various needs.  Do not use hot tubs, steam rooms, or saunas.  Do not douche or use tampons or scented sanitary pads.  Do not cross your legs for long periods of time.  Avoid cat litter boxes and soil used by cats. These carry germs that can cause birth defects in the baby and possibly loss of the fetus by miscarriage or stillbirth.  Avoid all smoking, herbs, alcohol, and medicines not prescribed by  your health care provider. Chemicals in these affect the formation and growth of the baby.  Do not use any tobacco products, including cigarettes, chewing tobacco, and electronic cigarettes. If you need help quitting, ask your health care provider. You may receive counseling support and other resources to help you quit.  Schedule a dentist appointment. At home, brush your teeth with a soft toothbrush and be gentle when you floss. SEEK MEDICAL CARE IF:   You have dizziness.  You have mild pelvic cramps, pelvic pressure, or nagging pain in the abdominal area.  You have persistent nausea, vomiting, or diarrhea.  You have a bad smelling vaginal discharge.  You have pain with urination.  You notice increased swelling in your face, hands, legs, or ankles. SEEK IMMEDIATE MEDICAL CARE IF:   You have a fever.  You are leaking fluid from your vagina.  You have spotting or bleeding from your vagina.  You have severe abdominal cramping or pain.  You have rapid weight gain or loss.  You vomit blood or material that looks like coffee grounds.  You are exposed to [place] measles and have never had them.  You are exposed to fifth disease or chickenpox.  You develop a severe headache.  You have shortness of breath.  You have any kind of trauma, such as from a fall or a car accident.   This information is not intended to replace advice given to you by your health care provider. Make sure you discuss any questions you have with your health care provider.   Document Released: 03/04/2001 Document Revised: 03/31/2014 Document Reviewed: 01/18/2013 Elsevier Interactive Patient Education 2016 [employer] Tdap Vaccine (Tetanus, Diphtheria and Pertussis): What You Need to Know 1. Why get vaccinated? Tetanus, diphtheria and pertussis are very serious diseases. Tdap vaccine can protect us from these diseases. And, Tdap vaccine given to pregnant women can protect newborn babies against  pertussis. TETANUS (Lockjaw) is rare in the [place] today. It causes painful muscle tightening and stiffness, usually all over the body.  It can lead to tightening of muscles in the head and neck so you can't open your mouth, swallow, or sometimes even breathe. Tetanus kills about 1 out of 10 people who are infected even after receiving the best medical care. DIPHTHERIA is also rare in the [place] today. It can cause a thick coating to form in the back of the throat.  It can lead to breathing problems, heart failure, paralysis, and death. PERTUSSIS (Whooping Cough) causes severe  coughing spells, which can cause difficulty breathing, vomiting and disturbed sleep.  It can also lead to weight loss, incontinence, and rib fractures. Up to 2 in 100 adolescents and 5 in 100 adults with pertussis are hospitalized or have complications, which could include pneumonia or death. These diseases are caused by bacteria. Diphtheria and pertussis are spread from person to person through secretions from coughing or sneezing. Tetanus enters the body through cuts, scratches, or wounds. Before vaccines, as many as 200,000 cases of diphtheria, 200,000 cases of pertussis, and hundreds of cases of tetanus, were reported in the [place] each year. Since vaccination began, reports of cases for tetanus and diphtheria have dropped by about 99% and for pertussis by about 80%. 2. Tdap vaccine Tdap vaccine can protect adolescents and adults from tetanus, diphtheria, and pertussis. One dose of Tdap is routinely given at age 63 or 89. People who did not get Tdap at that age should get it as soon as possible. Tdap is especially important for healthcare professionals and anyone having close contact with a baby younger than 12 months. Pregnant women should get a dose of Tdap during every pregnancy, to protect the newborn from pertussis. Infants are most at risk for severe, life-threatening complications from  pertussis. Another vaccine, called Td, protects against tetanus and diphtheria, but not pertussis. A Td booster should be given every 10 years. Tdap may be given as one of these boosters if you have never gotten Tdap before. Tdap may also be given after a severe cut or burn to prevent tetanus infection. Your doctor or the person giving you the vaccine can give you more information. Tdap may safely be given at the same time as other vaccines. 3. Some people should not get this vaccine  A person who has ever had a life-threatening allergic reaction after a previous dose of any diphtheria, tetanus or pertussis containing vaccine, OR has a severe allergy to any part of this vaccine, should not get Tdap vaccine. Tell the person giving the vaccine about any severe allergies.  Anyone who had coma or long repeated seizures within 7 days after a childhood dose of DTP or DTaP, or a previous dose of Tdap, should not get Tdap, unless a cause other than the vaccine was found. They can still get Td.  Talk to your doctor if you:  have seizures or another nervous system problem,  had severe pain or swelling after any vaccine containing diphtheria, tetanus or pertussis,  ever had a condition called Guillain-Barr Syndrome (GBS),  aren't feeling well on the day the shot is scheduled. 4. Risks With any medicine, including vaccines, there is a chance of side effects. These are usually mild and go away on their own. Serious reactions are also possible but are rare. Most people who get Tdap vaccine do not have any problems with it. Mild problems following Tdap (Did not interfere with activities)  Pain where the shot was given (about 3 in 4 adolescents or 2 in 3 adults)  Redness or swelling where the shot was given (about 1 person in 5)  Mild fever of at least 100.35F (up to about 1 in 25 adolescents or 1 in 100 adults)  Headache (about 3 or 4 people in 10)  Tiredness (about 1 person in 3 or 4)  Nausea,  vomiting, diarrhea, stomach ache (up to 1 in 4 adolescents or 1 in 10 adults)  Chills, sore joints (about 1 person in 10)  Body aches (about 1 person in  3 or 4)  Rash, swollen glands (uncommon) Moderate problems following Tdap (Interfered with activities, but did not require medical attention)  Pain where the shot was given (up to 1 in 5 or 6)  Redness or swelling where the shot was given (up to about 1 in 16 adolescents or 1 in 12 adults)  Fever over 102F (about 1 in 100 adolescents or 1 in 250 adults)  Headache (about 1 in 7 adolescents or 1 in 10 adults)  Nausea, vomiting, diarrhea, stomach ache (up to 1 or 3 people in 100)  Swelling of the entire arm where the shot was given (up to about 1 in 500). Severe problems following Tdap (Unable to perform usual activities; required medical attention)  Swelling, severe pain, bleeding and redness in the arm where the shot was given (rare). Problems that could happen after any vaccine:  People sometimes faint after a medical procedure, including vaccination. Sitting or lying down for about 15 minutes can help prevent fainting, and injuries caused by a fall. Tell your doctor if you feel dizzy, or have vision changes or ringing in the ears.  Some people get severe pain in the shoulder and have difficulty moving the arm where a shot was given. This happens very rarely.  Any medication can cause a severe allergic reaction. Such reactions from a vaccine are very rare, estimated at fewer than 1 in a million doses, and would happen within a few minutes to a few hours after the vaccination. As with any medicine, there is a very remote chance of a vaccine causing a serious injury or death. The safety of vaccines is always being monitored. For more information, visit: http://floyd.org/ 5. What if there is a serious problem? What should I look for?  Look for anything that concerns you, such as signs of a severe allergic reaction, very  high fever, or unusual behavior.  Signs of a severe allergic reaction can include hives, swelling of the face and throat, difficulty breathing, a fast heartbeat, dizziness, and weakness. These would usually start a few minutes to a few hours after the vaccination. What should I do?  If you think it is a severe allergic reaction or other emergency that can't wait, call 9-1-1 or get the person to the nearest hospital. Otherwise, call your doctor.  Afterward, the reaction should be reported to the Vaccine Adverse Event Reporting System (VAERS). Your doctor might file this report, or you can do it yourself through the VAERS web site at www.vaers.LAgents.no, or by calling 1-305-804-2541. VAERS does not give medical advice.  6. The National Vaccine Injury Compensation Program The [employer] Vaccine Injury Compensation Program (VICP) is a federal program that was created to compensate people who may have been injured by certain vaccines. Persons who believe they may have been injured by a vaccine can learn about the program and about filing a claim by calling 1-856-729-2780 or visiting the VICP website at SpiritualWord.at. There is a time limit to file a claim for compensation. 7. How can I learn more?  Ask your doctor. He or she can give you the vaccine package insert or suggest other sources of information.  Call your local or state health department.  Contact the Centers for Disease Control and Prevention (CDC):  Call 3021507874 (1-800-CDC-INFO) or  Visit CDC's website at PicCapture.uy CDC Tdap Vaccine VIS (05/17/13)   This information is not intended to replace advice given to you by your health care provider. Make sure you discuss any questions you have  with your health care provider.   Document Released: 09/09/2011 Document Revised: 03/31/2014 Document Reviewed: 06/22/2013 Elsevier Interactive Patient Education [employer]

## 2015-07-30 NOTE — Telephone Encounter (Signed)
Is patient still coming in for her appointment today?

## 2015-07-30 NOTE — Telephone Encounter (Signed)
Pt called stated the antibiotic [facility] gave her did not work. She stated she called [facility] and they have not called her back. Pt would like Dr. Laural BenesJohnson to contact [facility] to see what's going on? Please contact pt ASAP. Thanks.

## 2015-07-30 NOTE — Progress Notes (Signed)
BP 119/77 mmHg  Pulse 106  Temp(Src) 98.3 F (36.8 C)  Ht 5' 3.3" (1.608 m)  Wt 130 lb (58.968 kg)  BMI 22.81 kg/m2  SpO2 100%  LMP 05/30/2015 (Within Weeks)   Subjective:    Patient ID: Brittany Page, female    DOB: 01/30/1991, 25 y.o.   MRN: [id number]  HPI: Brittany Page is a 25 y.o. female  Chief Complaint  Patient presents with  . Vaginal Discharge    Patient thinks that she has BV again, patient also did not have a period in April.   VAGINAL DISCHARGE Duration: weeks Discharge description: mucous  Pruritus: yes Dysuria: no Malodorous: yes Urinary frequency: yes Fevers: no Abdominal pain: yes  Sexual activity: monogamous History of sexually transmitted diseases: no Recent antibiotic use: no Context: recurrent BV  Treatments attempted: none  PREGNANCY DIAGNOSIS- last period in the middle of March around 05/30/15- unsure of actual date Gravida/Para: G2P0 Home pregnancy test: negative a month ago Nausea: yes Vomiting: no Breast tenderness: yes Abdominal pain: yes Vaginal bleeding: no Pregnancy or labor complications: yes Fetal abnormalities: yes Contraception: none   Relevant past medical, surgical, family and social history reviewed and updated as indicated. Interim medical history since our last visit reviewed. Allergies and medications reviewed and updated.  Review of Systems  Constitutional: Negative.   Respiratory: Negative.   Cardiovascular: Negative.   Gastrointestinal: Positive for nausea and abdominal pain. Negative for vomiting, diarrhea, constipation, blood in stool, abdominal distention, anal bleeding and rectal pain.  Genitourinary: Positive for vaginal discharge. Negative for dysuria, urgency, frequency, hematuria, flank pain, decreased urine volume, vaginal bleeding, enuresis, difficulty urinating, genital sores, vaginal pain, menstrual problem, pelvic pain and dyspareunia.    Per HPI unless specifically indicated above     Objective:     BP 119/77 mmHg  Pulse 106  Temp(Src) 98.3 F (36.8 C)  Ht 5' 3.3" (1.608 m)  Wt 130 lb (58.968 kg)  BMI 22.81 kg/m2  SpO2 100%  LMP 05/30/2015 (Within Weeks)  Wt Readings from Last 3 Encounters:  07/30/15 130 lb (58.968 kg)  06/06/15 127 lb (57.607 kg)  05/24/15 129 lb 9.6 oz (58.786 kg)    Physical Exam  Constitutional: She is oriented to person, place, and time. She appears well-developed and well-nourished. No distress.  HENT:  Head: Normocephalic and atraumatic.  Right Ear: Hearing normal.  Left Ear: Hearing normal.  Nose: Nose normal.  Eyes: Conjunctivae and lids are normal. Right eye exhibits no discharge. Left eye exhibits no discharge. No scleral icterus.  Pulmonary/Chest: Effort normal. No respiratory distress.  Genitourinary: No labial fusion. There is no rash, tenderness, lesion or injury on the right labia. There is no rash, tenderness, lesion or injury on the left labia. No erythema, tenderness or bleeding in the vagina. No foreign body around the vagina. No signs of injury around the vagina. Vaginal discharge found.  Musculoskeletal: Normal range of motion.  Neurological: She is alert and oriented to person, place, and time.  Skin: Skin is warm, dry and intact. No rash noted. No erythema. No pallor.  Psychiatric: She has a normal mood and affect. Her speech is normal and behavior is normal. Judgment and thought content normal. Cognition and memory are normal.  Nursing note and vitals reviewed.   Results for orders placed or performed in visit on 06/06/15  Rapid [facility] screen ([facility])  Result Value Ref Range   Strep Gp A Ag, IA W/Reflex Negative Negative  Culture, Group A Strep  Result Value Ref Range   Strep A Culture Negative       Assessment & Plan:   Problem List Items Addressed This Visit    None    Visit Diagnoses    Pregnancy    -  Primary    Positive pregnancy. Appointment with GYN made today. Prenatal vitamins given to patient today.     Missed period        Will check pregnancy today.    Relevant Orders    Pregnancy, urine    Vaginal discharge        Negative. Likely due to pregnancy. Follow up with GYN.    Relevant Orders    WET PREP FOR TRICH, YEAST, CLUE    Immunization due        Tdap given today.    Relevant Orders    Tdap vaccine greater than or equal to 7yo IM        Follow up plan: Return if symptoms worsen or fail to improve.

## 2015-08-01 ENCOUNTER — Ambulatory Visit (INDEPENDENT_AMBULATORY_CARE_PROVIDER_SITE_OTHER): Payer: 59 | Admitting: Obstetrics and Gynecology

## 2015-08-01 ENCOUNTER — Ambulatory Visit (INDEPENDENT_AMBULATORY_CARE_PROVIDER_SITE_OTHER): Payer: 59

## 2015-08-01 VITALS — BP 105/69 | HR 68 | Wt 125.4 lb

## 2015-08-01 DIAGNOSIS — O09291 Supervision of pregnancy with other poor reproductive or obstetric history, first trimester: Secondary | ICD-10-CM

## 2015-08-01 DIAGNOSIS — Z113 Encounter for screening for infections with a predominantly sexual mode of transmission: Secondary | ICD-10-CM

## 2015-08-01 DIAGNOSIS — Z3687 Encounter for antenatal screening for uncertain dates: Secondary | ICD-10-CM

## 2015-08-01 DIAGNOSIS — N926 Irregular menstruation, unspecified: Secondary | ICD-10-CM | POA: Diagnosis not present

## 2015-08-01 DIAGNOSIS — Z369 Encounter for antenatal screening, unspecified: Secondary | ICD-10-CM

## 2015-08-01 DIAGNOSIS — Z36 Encounter for antenatal screening of mother: Secondary | ICD-10-CM

## 2015-08-01 DIAGNOSIS — Z1389 Encounter for screening for other disorder: Secondary | ICD-10-CM

## 2015-08-01 DIAGNOSIS — Z3490 Encounter for supervision of normal pregnancy, unspecified, unspecified trimester: Secondary | ICD-10-CM

## 2015-08-01 DIAGNOSIS — Z331 Pregnant state, incidental: Secondary | ICD-10-CM

## 2015-08-01 DIAGNOSIS — Z8759 Personal history of other complications of pregnancy, childbirth and the puerperium: Secondary | ICD-10-CM

## 2015-08-01 NOTE — Progress Notes (Signed)
I have reviewed the information as documented by Fenton Mallingebbie Ridgeway, LPN.  Patient s/p dating scan for h/o irreg menses.  Dates not consistent with US (3 week difference).  Information updated in EPIC. To f/u in 5-6 weeks, instead of 2 weeks as previously noted.

## 2015-08-01 NOTE — Progress Notes (Signed)
Brittany GooMarissa Haft presents for NOB nurse interview visit. G-2.  P-[id number]. Pt has history of stillbirth at 36 wk, in 04/2014 and Anemia. [facility].  Pt to have ultrasound today for unsure of lmp, irreg.menses, and history of stillbirth. Pregnancy education material explained and given. No cats in the home. NOB labs ordered.  HIV labs and Drug screen were explained optional and she could opt out of tests but did not decline. Drug screen ordered. PNV encouraged. NT to discuss with provider. Pt would also like a note for work because she has to lift doors/frames of 22lbs. Works at [employer]. They told her they would find her something if she has a note from her physician. Pt. To follow up with provider in 2 weeks for NOB physical unless ultrasound data is otherwise.  All questions answered.  ZIKA EXPOSURE SCREEN:  The patient has not traveled to a [place] endemic area within the past 6 months, nor has she had unprotected sex with a partner who has travelled to a [place] endemic region within the past 6 months. The patient has been advised to notify us if these factors change any time during this current pregnancy, so adequate testing and monitoring can be initiated.

## 2015-08-01 NOTE — Patient Instructions (Signed)
Pregnancy and Zika Virus Disease Zika virus disease, or Zika, is an illness that can spread to people from mosquitoes that carry the virus. It may also spread from person to person through infected body fluids. Zika first occurred in [place], but recently it has spread to new areas. The virus occurs in tropical climates. The location of Zika continues to change. Most people who become infected with Zika virus do not develop serious illness. However, Zika may cause birth defects in an unborn baby whose mother is infected with the virus. It may also increase the risk of miscarriage. WHAT ARE THE SYMPTOMS OF ZIKA VIRUS DISEASE? In many cases, people who have been infected with Zika virus do not develop any symptoms. If symptoms appear, they usually start about a week after the person is infected. Symptoms are usually mild. They may include:  Fever.  Rash.  Red eyes.  Joint pain. HOW DOES ZIKA VIRUS DISEASE SPREAD? The main way that Zika virus spreads is through the bite of a certain type of mosquito. Unlike most types of mosquitos, which bite only at night, the type of mosquito that carries Zika virus bites both at night and during the day. Zika virus can also spread through sexual contact, through a blood transfusion, and from a mother to her baby before or during birth. Once you have had Zika virus disease, it is unlikely that you will get it again. CAN I PASS ZIKA TO MY BABY DURING PREGNANCY? Yes, Zika can pass from a mother to her baby before or during birth. WHAT PROBLEMS CAN ZIKA CAUSE FOR MY BABY? A woman who is infected with Zika virus while pregnant is at risk of having her baby born with a condition in which the brain or head is smaller than expected (microcephaly). Babies who have microcephaly can have developmental delays, seizures, hearing problems, and vision problems. Having Zika virus disease during pregnancy can also increase the risk of miscarriage. HOW CAN ZIKA VIRUS DISEASE BE  PREVENTED? There is no vaccine to prevent Zika. The best way to prevent the disease is to avoid infected mosquitoes and avoid exposure to body fluids that can spread the virus. Avoid any possible exposure to Zika by taking the following precautions. For women and their sex partners:  Avoid traveling to high-risk areas. The locations where Zika is being reported change often. To identify high-risk areas, check the CDC travel website: www.cdc.gov/zika/geo/index.html  If you or your sex partner must travel to a high-risk area, talk with a health care provider before and after traveling.  Take all precautions to avoid mosquito bites if you live in, or travel to, any of the high-risk areas. Insect repellents are safe to use during pregnancy.  Ask your health care provider when it is safe to have sexual contact. For women:  If you are pregnant or trying to become pregnant, avoid sexual contact with persons who may have been exposed to Zika virus, persons who have possible symptoms of Zika, or persons whose history you are unsure about. If you choose to have sexual contact with someone who may have been exposed to Zika virus, use condoms correctly during the entire duration of sexual activity, every time. Do not share sexual devices, as you may be exposed to body fluids.  Ask your health care provider about when it is safe to attempt pregnancy after a possible exposure to Zika virus. WHAT STEPS SHOULD I TAKE TO AVOID MOSQUITO BITES? Take these steps to avoid mosquito bites when you are   in a high-risk area:  Wear loose clothing that covers your arms and legs.  Limit your outdoor activities.  Do not open windows unless they have window screens.  Sleep under mosquito nets.  Use insect repellent. The best insect repellents have:  DEET, picaridin, oil of lemon eucalyptus (OLE), or IR3535 in them.  Higher amounts of an active ingredient in them.  Remember that insect repellents are safe to use  during pregnancy.  Do not use OLE on children who are younger than 3 years of age. Do not use insect repellent on babies who are younger than 2 months of age.  Cover your child's stroller with mosquito netting. Make sure the netting fits snugly and that any loose netting does not cover your child's mouth or nose. Do not use a blanket as a mosquito-protection cover.  Do not apply insect repellent underneath clothing.  If you are using sunscreen, apply the sunscreen before applying the insect repellent.  Treat clothing with permethrin. Do not apply permethrin directly to your skin. Follow label directions for safe use.  Get rid of standing water, where mosquitoes may reproduce. Standing water is often found in items such as buckets, bowls, animal food dishes, and flowerpots. When you return from traveling to any high-risk area, continue taking actions to protect yourself against mosquito bites for 3 weeks, even if you show no signs of illness. This will prevent spreading Zika virus to uninfected mosquitoes. WHAT SHOULD I KNOW ABOUT THE SEXUAL TRANSMISSION OF ZIKA? People can spread Zika to their sexual partners during vaginal, anal, or oral sex, or by sharing sexual devices. Many people with Zika do not develop symptoms, so a person could spread the disease without knowing that they are infected. The greatest risk is to women who are pregnant or who may become pregnant. Zika virus can live longer in semen than it can live in blood. Couples can prevent sexual transmission of the virus by:  Using condoms correctly during the entire duration of sexual activity, every time. This includes vaginal, anal, and oral sex.  Not sharing sexual devices. Sharing increases your risk of being exposed to body fluid from another person.  Avoiding all sexual activity until your health care provider says it is safe. SHOULD I BE TESTED FOR ZIKA VIRUS? A sample of your blood can be tested for Zika virus. A pregnant  woman should be tested if she may have been exposed to the virus or if she has symptoms of Zika. She may also have additional tests done during her pregnancy, such ultrasound testing. Talk with your health care provider about which tests are recommended.   This information is not intended to replace advice given to you by your health care provider. Make sure you discuss any questions you have with your health care provider.   Document Released: 11/29/2014 Document Reviewed: 11/22/2014 Elsevier Interactive Patient Education 2016 [employer] Minor Illnesses and Medications in Pregnancy  Cold/Flu:  Sudafed for congestion- Robitussin (plain) for cough- Tylenol for discomfort.  Please follow the directions on the label.  Try not to take any more than needed.  OTC Saline nasal spray and air humidifier or cool-mist  Vaporizer to sooth nasal irritation and to loosen congestion.  It is also important to increase intake of non carbonated fluids, especially if you have a fever.  Constipation:  Colace-2 capsules at bedtime; Metamucil- follow directions on label; Senokot- 1 tablet at bedtime.  Any one of these medications can be used.  It is also   very important to increase fluids and fruits along with regular exercise.  If problem persists please call the office.  Diarrhea:  Kaopectate as directed on the label.  Eat a bland diet and increase fluids.  Avoid highly seasoned foods.  Headache:  Tylenol 1 or 2 tablets every 3-4 hours as needed  Indigestion:  Maalox, Mylanta, Tums or Rolaids- as directed on label.  Also try to eat small meals and avoid fatty, greasy or spicy foods.  Nausea with or without Vomiting:  Nausea in pregnancy is caused by increased levels of hormones in the body which influence the digestive system and cause irritation when stomach acids accumulate.  Symptoms usually subside after 1st trimester of pregnancy.  Try the following:  Keep saltines, graham crackers or dry toast by your bed  to eat upon awakening.  Don't let your stomach get empty.  Try to eat 5-6 small meals per day instead of 3 large ones.  Avoid greasy fatty or highly seasoned foods.   Take OTC Unisom 1 tablet at bed time along with OTC Vitamin B6 25-50 mg 3 times per day.    If nausea continues with vomiting and you are unable to keep down food and fluids you may need a prescription medication.  Please notify your provider.   Sore throat:  Chloraseptic spray, throat lozenges and or plain Tylenol.  Vaginal Yeast Infection:  OTC Monistat for 7 days as directed on label.  If symptoms do not resolve within a week notify provider.  If any of the above problems do not subside with recommended treatment please call the office for further assistance.   Do not take Aspirin, Advil, Motrin or Ibuprofen.  * * OTC= Over the counter Hyperemesis Gravidarum Hyperemesis gravidarum is a severe form of nausea and vomiting that happens during pregnancy. Hyperemesis is worse than morning sickness. It may cause you to have nausea or vomiting all day for many days. It may keep you from eating and drinking enough food and liquids. Hyperemesis usually occurs during the first half (the first 20 weeks) of pregnancy. It often goes away once a woman is in her second half of pregnancy. However, sometimes hyperemesis continues through an entire pregnancy.  CAUSES  The cause of this condition is not completely known but is thought to be related to changes in the body's hormones when pregnant. It could be from the high level of the pregnancy hormone or an increase in estrogen in the body.  SIGNS AND SYMPTOMS   Severe nausea and vomiting.  Nausea that does not go away.  Vomiting that does not allow you to keep any food down.  Weight loss and body fluid loss (dehydration).  Having no desire to eat or not liking food you have previously enjoyed. DIAGNOSIS  Your health care provider will do a physical exam and ask you about your symptoms.  He or she may also order blood tests and urine tests to make sure something else is not causing the problem.  TREATMENT  You may only need medicine to control the problem. If medicines do not control the nausea and vomiting, you will be treated in the hospital to prevent dehydration, increased acid in the blood (acidosis), weight loss, and changes in the electrolytes in your body that may harm the unborn baby (fetus). You may need IV fluids.  HOME CARE INSTRUCTIONS   Only take over-the-counter or prescription medicines as directed by your health care provider.  Try eating a couple of dry crackers or   toast in the morning before getting out of bed.  Avoid foods and smells that upset your stomach.  Avoid fatty and spicy foods.  Eat 5-6 small meals a day.  Do not drink when eating meals. Drink between meals.  For snacks, eat high-protein foods, such as cheese.  Eat or suck on things that have ginger in them. Ginger helps nausea.  Avoid food preparation. The smell of food can spoil your appetite.  Avoid iron pills and iron in your multivitamins until after 3-4 months of being pregnant. However, consult with your health care provider before stopping any prescribed iron pills. SEEK MEDICAL CARE IF:   Your abdominal pain increases.  You have a severe headache.  You have vision problems.  You are losing weight. SEEK IMMEDIATE MEDICAL CARE IF:   You are unable to keep fluids down.  You vomit blood.  You have constant nausea and vomiting.  You have excessive weakness.  You have extreme thirst.  You have dizziness or fainting.  You have a fever or persistent symptoms for more than 2-3 days.  You have a fever and your symptoms suddenly get worse. MAKE SURE YOU:   Understand these instructions.  Will watch your condition.  Will get help right away if you are not doing well or get worse.   This information is not intended to replace advice given to you by your health care  provider. Make sure you discuss any questions you have with your health care provider.   Document Released: 03/10/2005 Document Revised: 12/29/2012 Document Reviewed: 10/20/2012 Elsevier Interactive Patient Education 2016 [employer] Commonly Asked Questions During Pregnancy  Cats: A parasite can be excreted in cat feces.  To avoid exposure you need to have another person empty the little box.  If you must empty the litter box you will need to wear gloves.  Wash your hands after handling your cat.  This parasite can also be found in raw or undercooked meat so this should also be avoided.  Colds, Sore Throats, Flu: Please check your medication sheet to see what you can take for symptoms.  If your symptoms are unrelieved by these medications please call the office.  Dental Work: Most any dental work your dentist recommends is permitted.  X-rays should only be taken during the first trimester if absolutely necessary.  Your abdomen should be shielded with a lead apron during all x-rays.  Please notify your provider prior to receiving any x-rays.  Novocaine is fine; gas is not recommended.  If your dentist requires a note from us prior to dental work please call the office and we will provide one for you.  Exercise: Exercise is an important part of staying healthy during your pregnancy.  You may continue most exercises you were accustomed to prior to pregnancy.  Later in your pregnancy you will most likely notice you have difficulty with activities requiring balance like riding a bicycle.  It is important that you listen to your body and avoid activities that put you at a higher risk of falling.  Adequate rest and staying well hydrated are a must!  If you have questions about the safety of specific activities ask your provider.    Exposure to Children with illness: Try to avoid obvious exposure; report any symptoms to us when noted,  If you have chicken pos, red measles or mumps, you should be immune to  these diseases.   Please do not take any vaccines while pregnant unless you have checked with   your OB provider.  Fetal Movement: After 28 weeks we recommend you do "kick counts" twice daily.  Lie or sit down in a calm quiet environment and count your baby movements "kicks".  You should feel your baby at least 10 times per hour.  If you have not felt 10 kicks within the first hour get up, walk around and have something sweet to eat or drink then repeat for an additional hour.  If count remains less than 10 per hour notify your provider.  Fumigating: Follow your pest control agent's advice as to how long to stay out of your home.  Ventilate the area well before re-entering.  Hemorrhoids:   Most over-the-counter preparations can be used during pregnancy.  Check your medication to see what is safe to use.  It is important to use a stool softener or fiber in your diet and to drink lots of liquids.  If hemorrhoids seem to be getting worse please call the office.   Hot Tubs:  Hot tubs Jacuzzis and saunas are not recommended while pregnant.  These increase your internal body temperature and should be avoided.  Intercourse:  Sexual intercourse is safe during pregnancy as long as you are comfortable, unless otherwise advised by your provider.  Spotting may occur after intercourse; report any bright red bleeding that is heavier than spotting.  Labor:  If you know that you are in labor, please go to the hospital.  If you are unsure, please call the office and let us help you decide what to do.  Lifting, straining, etc:  If your job requires heavy lifting or straining please check with your provider for any limitations.  Generally, you should not lift items heavier than that you can lift simply with your hands and arms (no back muscles)  Painting:  Paint fumes do not harm your pregnancy, but may make you ill and should be avoided if possible.  Latex or water based paints have less odor than oils.  Use adequate  ventilation while painting.  Permanents & Hair Color:  Chemicals in hair dyes are not recommended as they cause increase hair dryness which can increase hair loss during pregnancy.  " Highlighting" and permanents are allowed.  Dye may be absorbed differently and permanents may not hold as well during pregnancy.  Sunbathing:  Use a sunscreen, as skin burns easily during pregnancy.  Drink plenty of fluids; avoid over heating.  Tanning Beds:  Because their possible side effects are still unknown, tanning beds are not recommended.  Ultrasound Scans:  Routine ultrasounds are performed at approximately 20 weeks.  You will be able to see your baby's general anatomy an if you would like to know the gender this can usually be determined as well.  If it is questionable when you conceived you may also receive an ultrasound early in your pregnancy for dating purposes.  Otherwise ultrasound exams are not routinely performed unless there is a medical necessity.  Although you can request a scan we ask that you pay for it when conducted because insurance does not cover " patient request" scans.  Work: If your pregnancy proceeds without complications you may work until your due date, unless your physician or employer advises otherwise.  Round Ligament Pain/Pelvic Discomfort:  Sharp, shooting pains not associated with bleeding are fairly common, usually occurring in the second trimester of pregnancy.  They tend to be worse when standing up or when you remain standing for long periods of time.  These are the result   of pressure of certain pelvic ligaments called "round ligaments".  Rest, Tylenol and heat seem to be the most effective relief.  As the womb and fetus grow, they rise out of the pelvis and the discomfort improves.  Please notify the office if your pain seems different than that described.  It may represent a more serious condition.   

## 2015-08-02 NOTE — Telephone Encounter (Signed)
Phone number was disconnected, pt has recently been seen

## 2015-08-03 LAB — CBC WITH DIFFERENTIAL/PLATELET
BASOS ABS: 0 10*3/uL (ref 0.0–0.2)
Basos: 0 %
EOS (ABSOLUTE): 0.1 10*3/uL (ref 0.0–0.4)
Eos: 1 %
Hematocrit: 40.3 % (ref 34.0–46.6)
Hemoglobin: 13.1 g/dL (ref 11.1–15.9)
IMMATURE GRANS (ABS): 0 10*3/uL (ref 0.0–0.1)
Immature Granulocytes: 0 %
LYMPHS ABS: 2.4 10*3/uL (ref 0.7–3.1)
LYMPHS: 24 %
MCH: 30.6 pg (ref 26.6–33.0)
MCHC: 32.5 g/dL (ref 31.5–35.7)
MCV: 94 fL (ref 79–97)
MONOCYTES: 7 %
Monocytes Absolute: 0.7 10*3/uL (ref 0.1–0.9)
Neutrophils Absolute: 6.5 10*3/uL (ref 1.4–7.0)
Neutrophils: 68 %
PLATELETS: 303 10*3/uL (ref 150–379)
RBC: 4.28 x10E6/uL (ref 3.77–5.28)
RDW: 13.2 % (ref 12.3–15.4)
WBC: 9.8 10*3/uL (ref 3.4–10.8)

## 2015-08-03 LAB — GC/CHLAMYDIA PROBE AMP
Chlamydia trachomatis, NAA: NEGATIVE
Neisseria gonorrhoeae by PCR: NEGATIVE

## 2015-08-03 LAB — URINE CULTURE, OB REFLEX

## 2015-08-03 LAB — RH TYPE: RH TYPE: POSITIVE

## 2015-08-03 LAB — RPR: RPR: NONREACTIVE

## 2015-08-03 LAB — VARICELLA ZOSTER ANTIBODY, IGM: VARICELLA IGM: 1 {index} — AB (ref 0.00–0.90)

## 2015-08-03 LAB — ANTIBODY SCREEN: ANTIBODY SCREEN: NEGATIVE

## 2015-08-03 LAB — SICKLE CELL SCREEN: Sickle Cell Screen: NEGATIVE

## 2015-08-03 LAB — RUBELLA ANTIBODY, IGM

## 2015-08-03 LAB — CULTURE, OB URINE

## 2015-08-03 LAB — ABO

## 2015-08-03 LAB — HIV ANTIBODY (ROUTINE TESTING W REFLEX): HIV SCREEN 4TH GENERATION: NONREACTIVE

## 2015-08-03 LAB — HEPATITIS B SURFACE ANTIGEN: Hepatitis B Surface Ag: NEGATIVE

## 2015-08-05 LAB — MONITOR DRUG PROFILE 14(MW)
AMPHETAMINE SCREEN URINE: NEGATIVE ng/mL
BARBITURATE SCREEN URINE: NEGATIVE ng/mL
BENZODIAZEPINE SCREEN, URINE: NEGATIVE ng/mL
Buprenorphine, Urine: NEGATIVE ng/mL
COCAINE(METAB.)SCREEN, URINE: NEGATIVE ng/mL
Creatinine(Crt), U: 302.5 mg/dL — ABNORMAL HIGH (ref 20.0–300.0)
Fentanyl, Urine: NEGATIVE pg/mL
MEPERIDINE SCREEN, URINE: NEGATIVE ng/mL
METHADONE SCREEN, URINE: NEGATIVE ng/mL
OPIATE SCREEN URINE: NEGATIVE ng/mL
OXYCODONE+OXYMORPHONE UR QL SCN: NEGATIVE ng/mL
PROPOXYPHENE SCREEN URINE: NEGATIVE ng/mL
Ph of Urine: 6.5 (ref 4.5–8.9)
Phencyclidine Qn, Ur: NEGATIVE ng/mL
SPECIFIC GRAVITY: 1.027
TRAMADOL SCREEN, URINE: NEGATIVE ng/mL

## 2015-08-05 LAB — MICROSCOPIC EXAMINATION
CASTS: NONE SEEN /LPF
Epithelial Cells (non renal): >10 /hpf — AB (ref 0–10)

## 2015-08-05 LAB — URINALYSIS, ROUTINE W REFLEX MICROSCOPIC
BILIRUBIN UA: NEGATIVE
Glucose, UA: NEGATIVE
LEUKOCYTES UA: NEGATIVE
Nitrite, UA: NEGATIVE
RBC UA: NEGATIVE
Urobilinogen, Ur: 1 mg/dL (ref 0.2–1.0)
pH, UA: 6.5 (ref 5.0–7.5)

## 2015-08-05 LAB — CANNABINOID (GC/MS), URINE
Cannabinoid: POSITIVE — AB
Carboxy THC (GC/MS): >300 ng/mL

## 2015-08-05 LAB — NICOTINE SCREEN, URINE: Cotinine Ql Scrn, Ur: NEGATIVE ng/mL

## 2015-08-17 ENCOUNTER — Telehealth: Payer: Self-pay

## 2015-08-17 NOTE — Telephone Encounter (Signed)
Pt calls and states that she has been having pain at her belly button x a few hours. Pt states that the pain is similar to cramping. Pt denies bleeding or severe lower abdominal cramping. Advised pt on tylenol 325mg  every 4-6 hours as needed, as well as soaking in a warm bath. Also advised pt on adequate hydration as she states she has only drank apple juice all day. Pt also questions what medication she can take for anxiety and depression during pregnancy advised pt on the use of zoloft during pregnancy to discuss at next visit with provider.

## 2015-08-22 ENCOUNTER — Encounter: Payer: 59 | Admitting: Obstetrics and Gynecology

## 2015-09-04 ENCOUNTER — Ambulatory Visit (INDEPENDENT_AMBULATORY_CARE_PROVIDER_SITE_OTHER): Payer: 59 | Admitting: Obstetrics and Gynecology

## 2015-09-04 ENCOUNTER — Encounter: Payer: Self-pay | Admitting: Obstetrics and Gynecology

## 2015-09-04 VITALS — BP 97/55 | HR 84 | Wt 128.5 lb

## 2015-09-04 DIAGNOSIS — Z3491 Encounter for supervision of normal pregnancy, unspecified, first trimester: Secondary | ICD-10-CM

## 2015-09-04 DIAGNOSIS — Z124 Encounter for screening for malignant neoplasm of cervix: Secondary | ICD-10-CM

## 2015-09-04 DIAGNOSIS — R300 Dysuria: Secondary | ICD-10-CM

## 2015-09-04 DIAGNOSIS — Z3481 Encounter for supervision of other normal pregnancy, first trimester: Secondary | ICD-10-CM

## 2015-09-04 DIAGNOSIS — O26891 Other specified pregnancy related conditions, first trimester: Secondary | ICD-10-CM

## 2015-09-04 LAB — POCT URINALYSIS DIPSTICK
Bilirubin, UA: NEGATIVE
Glucose, UA: NEGATIVE
Ketones, UA: NEGATIVE
NITRITE UA: NEGATIVE
PH UA: 6.5
Protein, UA: NEGATIVE
Spec Grav, UA: 1.025
UROBILINOGEN UA: NEGATIVE

## 2015-09-04 MED ORDER — DOCUSATE SODIUM 100 MG PO CAPS: 100.0000 mg | ORAL_CAPSULE | Freq: Two times a day (BID) | ORAL | Status: DC

## 2015-09-04 NOTE — Patient Instructions (Signed)
First Trimester of Pregnancy °The first trimester of pregnancy is from week 1 until the end of week 12 (months 1 through 3). A week after a sperm fertilizes an egg, the egg will implant on the wall of the uterus. This embryo will begin to develop into a baby. Genes from you and your partner are forming the baby. The female genes determine whether the baby is a boy or a girl. At 6-8 weeks, the eyes and face are formed, and the heartbeat can be seen on ultrasound. At the end of 12 weeks, all the baby's organs are formed.  °Now that you are pregnant, you will want to do everything you can to have a healthy baby. Two of the most important things are to get good prenatal care and to follow your health care provider's instructions. Prenatal care is all the medical care you receive before the baby's birth. This care will help prevent, find, and treat any problems during the pregnancy and childbirth. °BODY CHANGES °Your body goes through many changes during pregnancy. The changes vary from woman to woman.  °· You may gain or lose a couple of pounds at first. °· You may feel sick to your stomach (nauseous) and throw up (vomit). If the vomiting is uncontrollable, call your health care provider. °· You may tire easily. °· You may develop headaches that can be relieved by medicines approved by your health care provider. °· You may urinate more often. Painful urination may mean you have a bladder infection. °· You may develop heartburn as a result of your pregnancy. °· You may develop constipation because certain hormones are causing the muscles that push waste through your intestines to slow down. °· You may develop hemorrhoids or swollen, bulging veins (varicose veins). °· Your breasts may begin to grow larger and become tender. Your nipples may stick out more, and the tissue that surrounds them (areola) may become darker. °· Your gums may bleed and may be sensitive to brushing and flossing. °· Dark spots or blotches (chloasma,  mask of pregnancy) may develop on your face. This will likely fade after the baby is born. °· Your menstrual periods will stop. °· You may have a loss of appetite. °· You may develop cravings for certain kinds of food. °· You may have changes in your emotions from day to day, such as being excited to be pregnant or being concerned that something may go wrong with the pregnancy and baby. °· You may have more vivid and strange dreams. °· You may have changes in your hair. These can include thickening of your hair, rapid growth, and changes in texture. Some women also have hair loss during or after pregnancy, or hair that feels dry or thin. Your hair will most likely return to normal after your baby is born. °WHAT TO EXPECT AT YOUR PRENATAL VISITS °During a routine prenatal visit: °· You will be weighed to make sure you and the baby are growing normally. °· Your blood pressure will be taken. °· Your abdomen will be measured to track your baby's growth. °· The fetal heartbeat will be listened to starting around week 10 or 12 of your pregnancy. °· Test results from any previous visits will be discussed. °Your health care provider may ask you: °· How you are feeling. °· If you are feeling the baby move. °· If you have had any abnormal symptoms, such as leaking fluid, bleeding, severe headaches, or abdominal cramping. °· If you are using any tobacco products,   including cigarettes, chewing tobacco, and electronic cigarettes. °· If you have any questions. °Other tests that may be performed during your first trimester include: °· Blood tests to find your blood type and to check for the presence of any previous infections. They will also be used to check for low iron levels (anemia) and Rh antibodies. Later in the pregnancy, blood tests for diabetes will be done along with other tests if problems develop. °· Urine tests to check for infections, diabetes, or protein in the urine. °· An ultrasound to confirm the proper growth  and development of the baby. °· An amniocentesis to check for possible genetic problems. °· Fetal screens for spina bifida and Down syndrome. °· You may need other tests to make sure you and the baby are doing well. °· HIV (human immunodeficiency virus) testing. Routine prenatal testing includes screening for HIV, unless you choose not to have this test. °HOME CARE INSTRUCTIONS  °Medicines °· Follow your health care provider's instructions regarding medicine use. Specific medicines may be either safe or unsafe to take during pregnancy. °· Take your prenatal vitamins as directed. °· If you develop constipation, try taking a stool softener if your health care provider approves. °Diet °· Eat regular, well-balanced meals. Choose a variety of foods, such as meat or vegetable-based protein, fish, milk and low-fat dairy products, vegetables, fruits, and whole grain breads and cereals. Your health care provider will help you determine the amount of weight gain that is right for you. °· Avoid raw meat and uncooked cheese. These carry germs that can cause birth defects in the baby. °· Eating four or five small meals rather than three large meals a day may help relieve nausea and vomiting. If you start to feel nauseous, eating a few soda crackers can be helpful. Drinking liquids between meals instead of during meals also seems to help nausea and vomiting. °· If you develop constipation, eat more high-fiber foods, such as fresh vegetables or fruit and whole grains. Drink enough fluids to keep your urine clear or pale yellow. °Activity and Exercise °· Exercise only as directed by your health care provider. Exercising will help you: °¨ Control your weight. °¨ Stay in shape. °¨ Be prepared for labor and delivery. °· Experiencing pain or cramping in the lower abdomen or low back is a good sign that you should stop exercising. Check with your health care provider before continuing normal exercises. °· Try to avoid standing for long  periods of time. Move your legs often if you must stand in one place for a long time. °· Avoid heavy lifting. °· Wear low-heeled shoes, and practice good posture. °· You may continue to have sex unless your health care provider directs you otherwise. °Relief of Pain or Discomfort °· Wear a good support bra for breast tenderness.   °· Take warm sitz baths to soothe any pain or discomfort caused by hemorrhoids. Use hemorrhoid cream if your health care provider approves.   °· Rest with your legs elevated if you have leg cramps or low back pain. °· If you develop varicose veins in your legs, wear support hose. Elevate your feet for 15 minutes, 3-4 times a day. Limit salt in your diet. °Prenatal Care °· Schedule your prenatal visits by the twelfth week of pregnancy. They are usually scheduled monthly at first, then more often in the last 2 months before delivery. °· Write down your questions. Take them to your prenatal visits. °· Keep all your prenatal visits as directed by your   health care provider. °Safety °· Wear your seat belt at all times when driving. °· Make a list of emergency phone numbers, including numbers for family, friends, the hospital, and police and fire departments. °General Tips °· Ask your health care provider for a referral to a local prenatal education class. Begin classes no later than at the beginning of month 6 of your pregnancy. °· Ask for help if you have counseling or nutritional needs during pregnancy. Your health care provider can offer advice or refer you to specialists for help with various needs. °· Do not use hot tubs, steam rooms, or saunas. °· Do not douche or use tampons or scented sanitary pads. °· Do not cross your legs for long periods of time. °· Avoid cat litter boxes and soil used by cats. These carry germs that can cause birth defects in the baby and possibly loss of the fetus by miscarriage or stillbirth. °· Avoid all smoking, herbs, alcohol, and medicines not prescribed by  your health care provider. Chemicals in these affect the formation and growth of the baby. °· Do not use any tobacco products, including cigarettes, chewing tobacco, and electronic cigarettes. If you need help quitting, ask your health care provider. You may receive counseling support and other resources to help you quit. °· Schedule a dentist appointment. At home, brush your teeth with a soft toothbrush and be gentle when you floss. °SEEK MEDICAL CARE IF:  °· You have dizziness. °· You have mild pelvic cramps, pelvic pressure, or nagging pain in the abdominal area. °· You have persistent nausea, vomiting, or diarrhea. °· You have a bad smelling vaginal discharge. °· You have pain with urination. °· You notice increased swelling in your face, hands, legs, or ankles. °SEEK IMMEDIATE MEDICAL CARE IF:  °· You have a fever. °· You are leaking fluid from your vagina. °· You have spotting or bleeding from your vagina. °· You have severe abdominal cramping or pain. °· You have rapid weight gain or loss. °· You vomit blood or material that looks like coffee grounds. °· You are exposed to German measles and have never had them. °· You are exposed to fifth disease or chickenpox. °· You develop a severe headache. °· You have shortness of breath. °· You have any kind of trauma, such as from a fall or a car accident. °  °This information is not intended to replace advice given to you by your health care provider. Make sure you discuss any questions you have with your health care provider. °  °Document Released: 03/04/2001 Document Revised: 03/31/2014 Document Reviewed: 01/18/2013 °Elsevier Interactive Patient Education ©2016 [employer] °Constipation, Adult °Constipation is when a person has fewer than three bowel movements a week, has difficulty having a bowel movement, or has stools that are dry, hard, or larger than normal. As people grow older, constipation is more common. A low-fiber diet, not taking in enough fluids, and  taking certain medicines may make constipation worse.  °CAUSES  °· Certain medicines, such as antidepressants, pain medicine, iron supplements, antacids, and water pills.   °· Certain diseases, such as diabetes, irritable bowel syndrome (IBS), thyroid disease, or depression.   °· Not drinking enough water.   °· Not eating enough fiber-rich foods.   °· Stress or travel.   °· Lack of physical activity or exercise.   °· Ignoring the urge to have a bowel movement.   °· Using laxatives too much.   °SIGNS AND SYMPTOMS  °· Having fewer than three bowel movements a week.   °· Straining   to have a bowel movement.   °· Having stools that are hard, dry, or larger than normal.   °· Feeling full or bloated.   °· Pain in the lower abdomen.   °· Not feeling relief after having a bowel movement.   °DIAGNOSIS  °Your health care provider will take a medical history and perform a physical exam. Further testing may be done for severe constipation. Some tests may include: °· A barium enema X-ray to examine your rectum, colon, and, sometimes, your small intestine.   °· A sigmoidoscopy to examine your lower colon.   °· A colonoscopy to examine your entire colon. °TREATMENT  °Treatment will depend on the severity of your constipation and what is causing it. Some dietary treatments include drinking more fluids and eating more fiber-rich foods. Lifestyle treatments may include regular exercise. If these diet and lifestyle recommendations do not help, your health care provider may recommend taking over-the-counter laxative medicines to help you have bowel movements. Prescription medicines may be prescribed if over-the-counter medicines do not work.  °HOME CARE INSTRUCTIONS  °· Eat foods that have a lot of fiber, such as fruits, vegetables, whole grains, and beans. °· Limit foods high in fat and processed sugars, such as french fries, hamburgers, cookies, candies, and soda.   °· A fiber supplement may be added to your diet if you cannot get  enough fiber from foods.   °· Drink enough fluids to keep your urine clear or pale yellow.   °· Exercise regularly or as directed by your health care provider.   °· Go to the restroom when you have the urge to go. Do not hold it.   °· Only take over-the-counter or prescription medicines as directed by your health care provider. Do not take other medicines for constipation without talking to your health care provider first.   °SEEK IMMEDIATE MEDICAL CARE IF:  °· You have bright red blood in your stool.   °· Your constipation lasts for more than 4 days or gets worse.   °· You have abdominal or rectal pain.   °· You have thin, pencil-like stools.   °· You have unexplained weight loss. °MAKE SURE YOU:  °· Understand these instructions. °· Will watch your condition. °· Will get help right away if you are not doing well or get worse. °  °This information is not intended to replace advice given to you by your health care provider. Make sure you discuss any questions you have with your health care provider. °  °Document Released: 12/07/2003 Document Revised: 03/31/2014 Document Reviewed: 12/20/2012 °Elsevier Interactive Patient Education ©2016 [employer] ° °

## 2015-09-04 NOTE — Progress Notes (Signed)
OBSTETRIC INITIAL PRENATAL VISIT  Subjective:    Brittany Page is being seen today for her first obstetrical visit.  This is not a planned pregnancy. She is a [id number] female at [redacted]w[redacted]d gestation, Estimated Date of Delivery: 03/26/16 by 6 week sono, not consistent with unsure LMP (05/30/2015). Her obstetrical history is significant for previoius stillborn at [redacted] weeks gestation (notes cord accident as the cause). Relationship with FOB: significant other, not living together. Patient does not intend to breast feed. Pregnancy history fully reviewed.   Obstetric History   G2   P1   T0   P1   A0   TAB0   SAB0   E0   M0   L0     # Outcome Date GA Lbr Len/2nd Weight Sex Delivery Anes PTL Lv  2 Current           1 Preterm 05/03/14 [redacted]w[redacted]d   M Vag-Spont EPI N FD    Obstetric Comments  05/03/2014 stillbirth (cord twisted), unsure this was why.    Gynecologic History:  Last pap smear was .  Results were normal/abnormal.  Denies/admits h/o abnormal pap smaers in the past.  Denies/admits history of STIs.    Past Medical History  Diagnosis Date  . Low hemoglobin   . Miscarriage 04/2014    lost at 8 month gestation (still born)  . Anemia 01/15/2015    Family History  Problem Relation Age of Onset  . Hypertension Father   . Migraines Father   . Cancer Maternal Grandmother     cervical cancer  . Hepatitis B Maternal Grandfather   . Heart attack Paternal Grandfather   . Sleep apnea Father     sinus problems  . Pancreatic cancer Maternal Grandfather     Past Surgical History  Procedure Laterality Date  . Wisdom tooth extraction      tooth in nasal cavity    Social History   Social History  . Marital Status: Single    Spouse Name: N/A  . Number of Children: N/A  . Years of Education: N/A   Occupational History  . Not on file.   Social History Main Topics  . Smoking status: Former Games developer  . Smokeless tobacco: Never Used  . Alcohol Use: 0.0 oz/week    0 Standard drinks or  equivalent per week     Comment: Ocassionally   . Drug Use: No  . Sexual Activity:    Partners: Male    Birth Control/ Protection: None     Comment: Pregnant    Other Topics Concern  . Not on file   Social History Narrative     Current Outpatient Prescriptions on File Prior to Visit  Medication Sig Dispense Refill  . Prenatal Vit-Fe Fumarate-FA (PRENATAL VITAMIN) 27-0.8 MG TABS Take 1 tablet by mouth daily. 30 tablet 12   No current facility-administered medications on file prior to visit.    No Known Allergies   Review of Systems General:Not Present- Fever, Weight Loss and Weight Gain. Skin:Not Present- Rash. HEENT:Not Present- Blurred Vision, Headache and Bleeding Gums. Respiratory:Not Present- Difficulty Breathing. Breast:Present - Breast tenderness. Not Present- Breast Mass. Cardiovascular:Not Present- Chest Pain, Elevated Blood Pressure, Fainting / Blacking Out and Shortness of Breath. Gastrointestinal:Present - Constipation (notes she only goes 1 x weekly, firm stools) and nausea/vomiting (although improved). Not Present- Abdominal Pain.  Female Genitourinary:Present - Vaginal discharge (not itching, or burning). Not Present- Frequency, Painful Urination, Pelvic Pain, Vaginal Bleeding, Vaginal Discharge, Contractions, regular, Fetal Movements  Decreased, Urinary Complaints and Vaginal Fluid. Musculoskeletal:Not Present- Back Pain and Leg Cramps. Neurological:Not Present- Dizziness. Psychiatric:Not Present- Depression.     Objective:   Blood pressure 97/55, pulse 84, weight 128 lb 8 oz (58.287 kg), last menstrual period 05/30/2015.  Body mass index is 22.54 kg/(m^2).  General Appearance:    Alert, cooperative, no distress, appears stated age  Head:    Normocephalic, without obvious abnormality, atraumatic  Eyes:    PERRL, conjunctiva/corneas clear, EOM's intact, both eyes  Ears:    Normal external ear canals, both ears  Nose:   Nares normal, septum  midline, mucosa normal, no drainage or sinus tenderness  Throat:   Lips, mucosa, and tongue normal; teeth and gums normal  Neck:   Supple, symmetrical, trachea midline, no adenopathy; thyroid: no enlargement/tenderness/nodules; no carotid bruit or JVD  Back:     Symmetric, no curvature, ROM normal, no CVA tenderness  Lungs:     Clear to auscultation bilaterally, respirations unlabored  Chest Wall:    No tenderness or deformity   Heart:    Regular rate and rhythm, S1 and S2 normal, no murmur, rub or gallop  Breast Exam:    No tenderness, masses, or nipple abnormality  Abdomen:     Soft, non-tender, bowel sounds active all four quadrants, no masses, no organomegaly.  FHT 160 bpm.  Genitalia:    Pelvic:external genitalia normal, vagina without lesions, discharge, or tenderness, rectovaginal septum  normal. Cervix normal in appearance, no cervical motion tenderness, no adnexal masses or tenderness.  Pregnancy positive findings: uterine enlargement: 11 wk size, nontender.   Rectal:    Normal external sphincter.  No hemorrhoids appreciated. Internal exam not done.   Extremities:   Extremities normal, atraumatic, no cyanosis or edema  Pulses:   2+ and symmetric all extremities  Skin:   Skin color, texture, turgor normal, no rashes or lesions  Lymph nodes:   Cervical, supraclavicular, and axillary nodes normal  Neurologic:   CNII-XII intact, normal strength, sensation and reflexes throughout     Assessment:   Pregnancy at 10 and 6/7 weeks   History of previous stillborn in 3rd trimester Marijuana use Constipation in pregnancy Vaginal discharge (leukorrhea of pregnancy) Dysuria  Plan:   Initial labs reviewed. Pap smear performed today.  Prenatal vitamins encouraged. Problem list reviewed and updated. Counseled on marijuana cessation.   Urinalysis with no evidence of UTI.  Advised on adequate hydration, cranberry uice.  Will prescribe Colace for constipation in pregnancy.  Advised on  increasing water and fiber intake.  History of previous stillborn - unexplained. Patient underwent testing which was all normal.  Suspect possible cord accident. Discussed need for antenatal testing and fetal kick counts once in 3rd trimester.  New OB counseling:  The patient has been given an overview regarding routine prenatal care.  Recommendations regarding diet, weight gain, and exercise in pregnancy were given. Prenatal testing, optional genetic testing, and ultrasound use in pregnancy were reviewed.  AFP3 discussed: ordered. Benefits of Breast Feeding were discussed. The patient is encouraged to consider nursing her baby post partum. Follow up in 4 weeks.  50% of 30 min visit spent on counseling and coordination of care.    Hildred LaserAnika Abbas Beyene, MD [facility]

## 2015-09-05 DIAGNOSIS — R825 Elevated urine levels of drugs, medicaments and biological substances: Secondary | ICD-10-CM | POA: Clinically undetermined

## 2015-09-05 LAB — URINE CULTURE: ORGANISM ID, BACTERIA: NO GROWTH

## 2015-09-06 LAB — PAP IG, CT-NG, RFX HPV ASCU
CHLAMYDIA, NUC. ACID AMP: NEGATIVE
GONOCOCCUS BY NUCLEIC ACID AMP: NEGATIVE
PAP Smear Comment: 0

## 2015-09-18 ENCOUNTER — Other Ambulatory Visit: Payer: Self-pay | Admitting: Obstetrics and Gynecology

## 2015-09-18 DIAGNOSIS — Z3682 Encounter for antenatal screening for nuchal translucency: Secondary | ICD-10-CM

## 2015-09-21 ENCOUNTER — Other Ambulatory Visit: Payer: Self-pay | Admitting: Obstetrics and Gynecology

## 2015-09-21 ENCOUNTER — Ambulatory Visit (INDEPENDENT_AMBULATORY_CARE_PROVIDER_SITE_OTHER): Payer: 59

## 2015-09-21 DIAGNOSIS — Z3682 Encounter for antenatal screening for nuchal translucency: Secondary | ICD-10-CM

## 2015-09-21 DIAGNOSIS — O283 Abnormal ultrasonic finding on antenatal screening of mother: Secondary | ICD-10-CM | POA: Diagnosis not present

## 2015-09-24 ENCOUNTER — Emergency Department
Admission: EM | Admit: 2015-09-24 | Discharge: 2015-09-24 | Disposition: A | Discharge status: Home / Self Care | Payer: 59 | Attending: Emergency Medicine | Admitting: Emergency Medicine

## 2015-09-24 ENCOUNTER — Encounter: Payer: Self-pay | Admitting: Emergency Medicine

## 2015-09-24 ENCOUNTER — Emergency Department: Payer: 59

## 2015-09-24 DIAGNOSIS — O99351 Diseases of the nervous system complicating pregnancy, first trimester: Secondary | ICD-10-CM | POA: Diagnosis present

## 2015-09-24 DIAGNOSIS — Z3A13 13 weeks gestation of pregnancy: Secondary | ICD-10-CM | POA: Diagnosis not present

## 2015-09-24 DIAGNOSIS — R51 Headache: Secondary | ICD-10-CM | POA: Diagnosis not present

## 2015-09-24 DIAGNOSIS — Z79899 Other long term (current) drug therapy: Secondary | ICD-10-CM | POA: Diagnosis not present

## 2015-09-24 DIAGNOSIS — R519 Headache, unspecified: Secondary | ICD-10-CM

## 2015-09-24 LAB — CBC WITH DIFFERENTIAL/PLATELET
BASOS ABS: 0 10*3/uL (ref 0–0.1)
Basophils Relative: 0 %
Eosinophils Absolute: 0.2 10*3/uL (ref 0–0.7)
Eosinophils Relative: 2 %
HEMATOCRIT: 33.9 % — AB (ref 35.0–47.0)
Hemoglobin: 11.8 g/dL — ABNORMAL LOW (ref 12.0–16.0)
LYMPHS PCT: 22 %
Lymphs Abs: 2.5 10*3/uL (ref 1.0–3.6)
MCH: 31.2 pg (ref 26.0–34.0)
MCHC: 34.8 g/dL (ref 32.0–36.0)
MCV: 89.7 fL (ref 80.0–100.0)
Monocytes Absolute: 0.8 10*3/uL (ref 0.2–0.9)
Monocytes Relative: 7 %
NEUTROS ABS: 7.8 10*3/uL — AB (ref 1.4–6.5)
Neutrophils Relative %: 69 %
PLATELETS: 267 10*3/uL (ref 150–440)
RBC: 3.78 MIL/uL — AB (ref 3.80–5.20)
RDW: 13 % (ref 11.5–14.5)
WBC: 11.3 10*3/uL — AB (ref 3.6–11.0)

## 2015-09-24 LAB — BASIC METABOLIC PANEL
ANION GAP: 8 (ref 5–15)
BUN: 11 mg/dL (ref 6–20)
CO2: 23 mmol/L (ref 22–32)
Calcium: 9.5 mg/dL (ref 8.9–10.3)
Chloride: 103 mmol/L (ref 101–111)
Creatinine, Ser: 0.57 mg/dL (ref 0.44–1.00)
GFR calc Af Amer: >60 mL/min (ref >60)
Glucose, Bld: 104 mg/dL — ABNORMAL HIGH (ref 65–99)
POTASSIUM: 3.2 mmol/L — AB (ref 3.5–5.1)
SODIUM: 134 mmol/L — AB (ref 135–145)

## 2015-09-24 MED ORDER — DIPHENHYDRAMINE HCL 50 MG/ML IJ SOLN
25.0000 mg | Freq: Once | INTRAMUSCULAR | Status: AC
  Administered 2015-09-24: 25 mg via INTRAVENOUS
  Filled 2015-09-24: qty 1

## 2015-09-24 MED ORDER — METOCLOPRAMIDE HCL 10 MG PO TABS: 10.0000 mg | ORAL_TABLET | Freq: Three times a day (TID) | ORAL | Status: DC

## 2015-09-24 MED ORDER — METOCLOPRAMIDE HCL 5 MG/ML IJ SOLN
10.0000 mg | Freq: Once | INTRAMUSCULAR | Status: AC
  Administered 2015-09-24: 10 mg via INTRAVENOUS
  Filled 2015-09-24: qty 2

## 2015-09-24 MED ORDER — DIPHENHYDRAMINE HCL 25 MG PO CAPS: 50.0000 mg | ORAL_CAPSULE | Freq: Four times a day (QID) | ORAL | Status: DC | PRN

## 2015-09-24 MED ORDER — SODIUM CHLORIDE 0.9 % IV BOLUS (SEPSIS)
1000.0000 mL | Freq: Once | INTRAVENOUS | Status: AC
  Administered 2015-09-24: 1000 mL via INTRAVENOUS

## 2015-09-24 NOTE — ED Notes (Addendum)
Pt is [redacted] weeks pregnant and is complaining of having headaches x 2-3 days that are keeping her awake at night.  Pt states that head only hurts when she "leans over or lies down".  Reports taking Tylenol, but states it does not help.

## 2015-09-24 NOTE — Discharge Instructions (Signed)
General Headache Without Cause A headache is pain or discomfort felt around the head or neck area. The specific cause of a headache may not be found. There are many causes and types of headaches. A few common ones are:  Tension headaches.  Migraine headaches.  Cluster headaches.  Chronic daily headaches. HOME CARE INSTRUCTIONS  Watch your condition for any changes. Take these steps to help with your condition: Managing Pain  Take over-the-counter and prescription medicines only as told by your health care provider.  Lie down in a dark, quiet room when you have a headache.  If directed, apply ice to the head and neck area:  Put ice in a plastic bag.  Place a towel between your skin and the bag.  Leave the ice on for 20 minutes, 2-3 times per day.  Use a heating pad or hot shower to apply heat to the head and neck area as told by your health care provider.  Keep lights dim if bright lights bother you or make your headaches worse. Eating and Drinking  Eat meals on a regular schedule.  Limit alcohol use.  Decrease the amount of caffeine you drink, or stop drinking caffeine. General Instructions  Keep all follow-up visits as told by your health care provider. This is important.  Keep a headache journal to help find out what may trigger your headaches. For example, write down:  What you eat and drink.  How much sleep you get.  Any change to your diet or medicines.  Try massage or other relaxation techniques.  Limit stress.  Sit up straight, and do not tense your muscles.  Do not use tobacco products, including cigarettes, chewing tobacco, or e-cigarettes. If you need help quitting, ask your health care provider.  Exercise regularly as told by your health care provider.  Sleep on a regular schedule. Get 7-9 hours of sleep, or the amount recommended by your health care provider. SEEK MEDICAL CARE IF:   Your symptoms are not helped by medicine.  You have a  headache that is different from the usual headache.  You have nausea or you vomit.  You have a fever. SEEK IMMEDIATE MEDICAL CARE IF:   Your headache becomes severe.  You have repeated vomiting.  You have a stiff neck.  You have a loss of vision.  You have problems with speech.  You have pain in the eye or ear.  You have muscular weakness or loss of muscle control.  You lose your balance or have trouble walking.  You feel faint or pass out.  You have confusion.   This information is not intended to replace advice given to you by your health care provider. Make sure you discuss any questions you have with your health care provider.   Document Released: 03/10/2005 Document Revised: 11/29/2014 Document Reviewed: 07/03/2014 Elsevier Interactive Patient Education 2016 Elsevier Inc.  Sinus Headache A sinus headache occurs when the paranasal sinuses become clogged or swollen. Paranasal sinuses are air pockets within the bones of the face. Sinus headaches can range from mild to severe. CAUSES A sinus headache can result from various conditions that affect the sinuses, such as:  Colds.  Sinus infections.  Allergies. SYMPTOMS The main symptom of this condition is a headache that may feel like pain or pressure in the face, forehead, ears, or upper teeth. People who have a sinus headache often have other symptoms, such as:  Congested or runny nose.  Fever.  Inability to smell. Weather changes can make  symptoms worse. DIAGNOSIS This condition may be diagnosed based on:  A physical exam and medical history.  Imaging tests, such as a CT scan and MRI, to check for problems with the sinuses.  A specialist may look into the sinuses with a tool that has a camera (endoscopy). TREATMENT Treatment for this condition depends on the cause.  Sinus pain that is caused by a sinus infection may be treated with antibiotic medicine.  Sinus pain that is caused by allergies may be  helped by allergy medicines (antihistamines) and medicated nasal sprays.  Sinus pain that is caused by congestion may be helped by flushing the nose and sinuses with saline solution. HOME CARE INSTRUCTIONS  Take medicines only as directed by your health care provider.  If you were prescribed an antibiotic medicine, finish all of it even if you start to feel better.  If you have congestion, use a nasal spray to help reduce pressure.  If directed, apply a warm, moist washcloth to your face to help relieve pain. SEEK MEDICAL CARE IF:  You have headaches more than one time each week.  You have sensitivity to light or sound.  You have a fever.  You feel sick to your stomach (nauseous) or you throw up (vomit).  Your headaches do not get better with treatment. Many people think that they have a sinus headache when they actually have migraines or tension headaches. SEEK IMMEDIATE MEDICAL CARE IF:  You have vision problems.  You have sudden, severe pain in your face or head.  You have a seizure.  You are confused.  You have a stiff neck.   This information is not intended to replace advice given to you by your health care provider. Make sure you discuss any questions you have with your health care provider.   Document Released: 04/17/2004 Document Revised: 07/25/2014 Document Reviewed: 03/06/2014 Elsevier Interactive Patient Education [employer] Elsevier Inc.

## 2015-09-24 NOTE — ED Notes (Signed)
Phone provided to pt to complete MRI checklist

## 2015-09-24 NOTE — ED Notes (Signed)
In MRI

## 2015-09-24 NOTE — ED Provider Notes (Signed)
[facility] Emergency Department Provider Note  ____________________________________________  Time seen: 9:30 AM  I have reviewed the triage vital signs and the nursing notes.   HISTORY  Chief Complaint Headache    HPI Brittany Page is a 25 y.o. female who complains of gradual onset headache for the past 3 days. It is present when leaning over or lying supine, relieved when she is upright. She tried taking Tylenol which did not help. She has a history of migraines but this is different. She's never had headaches like this before. It is throbbing, bilateral frontal. No vision changes numbness tingling or weakness. No nausea or vomiting. She is [redacted] weeks pregnant getting prenatal care at [facility], taking prenatal vitamins. No other medical problems or significant prior surgeries. Has a history of a intrauterine fetal demise at 8 months gestation. No fevers pain or neck stiffness.  Was recently treated for pneumonia with what she believes is amoxicillin.  No vaginal bleeding or discharge. No dysuria frequency urgency. No contractions.   Past Medical History  Diagnosis Date  . Low hemoglobin   . Miscarriage 04/2014    lost at 8 month gestation (still born)  . Anemia 01/15/2015     Patient Active Problem List   Diagnosis Date Noted  . History of unexplained stillbirth 08/01/2015  . Anemia 01/15/2015  . Miscarriage within last 12 months 01/15/2015     Past Surgical History  Procedure Laterality Date  . Wisdom tooth extraction      tooth in nasal cavity     Current Outpatient Rx  Name  Route  Sig  Dispense  Refill  . Acetaminophen 325 MG CAPS   Oral   Take 650 mg by mouth every 6 (six) hours as needed (for pain/fever).         . Prenatal Vit-Fe Fumarate-FA (PRENATAL VITAMIN) 27-0.8 MG TABS   Oral   Take 1 tablet by mouth daily. Patient taking differently: Take 1 tablet by mouth at bedtime.    30 tablet   12   . diphenhydrAMINE  (BENADRYL) 25 mg capsule   Oral   Take 2 capsules (50 mg total) by mouth every 6 (six) hours as needed.   60 capsule   0   . docusate sodium (COLACE) 100 MG capsule   Oral   Take 1 capsule (100 mg total) by mouth 2 (two) times daily. Patient not taking: Reported on 09/24/2015   60 capsule   3   . metoCLOPramide (REGLAN) 10 MG tablet   Oral   Take 1 tablet (10 mg total) by mouth 4 (four) times daily -  before meals and at bedtime.   30 tablet   0      Allergies Review of patient's allergies indicates no known allergies.   Family History  Problem Relation Age of Onset  . Hypertension Father   . Migraines Father   . Cancer Maternal Grandmother     cervical cancer  . Hepatitis B Maternal Grandfather   . Heart attack Paternal Grandfather   . Sleep apnea Father     sinus problems  . Pancreatic cancer Maternal Grandfather     Social History Social History  Substance Use Topics  . Smoking status: Never Smoker   . Smokeless tobacco: Never Used  . Alcohol Use: No    Review of Systems  Constitutional:   No fever or chills.  ENT:   No sore throat. No rhinorrhea. Cardiovascular:   No chest pain. Respiratory:  No dyspnea , Persistent subacute cough Gastrointestinal:   Negative for abdominal pain, vomiting and diarrhea.  Genitourinary:   Negative for dysuria or difficulty urinating. Musculoskeletal:   Negative for focal pain or swelling Neurological:   Positive as above for headache 10-point ROS otherwise negative.  ____________________________________________   PHYSICAL EXAM:  VITAL SIGNS: ED Triage Vitals  Enc Vitals Group     BP 09/24/15 0906 118/65 mmHg     Pulse Rate 09/24/15 0906 103     Resp 09/24/15 0906 20     Temp 09/24/15 0906 98.3 F (36.8 C)     Temp Source 09/24/15 0906 Oral     SpO2 09/24/15 0906 100 %     Weight 09/24/15 0906 135 lb (61.236 kg)     Height 09/24/15 0906  (1.626 m)     Head Cir --      Peak Flow --      Pain Score  09/24/15 0908 0     Pain Loc --      Pain Edu? --      Excl. in GC? --     Vital signs reviewed, nursing assessments reviewed.   Constitutional:   Alert and oriented. Well appearing and in no distress. Eyes:   No scleral icterus. No conjunctival pallor. PERRL. EOMI.  No nystagmus. ENT   Head:   Normocephalic and atraumatic.   Nose:   No congestion/rhinnorhea. No septal hematoma   Mouth/Throat:   MMM, no pharyngeal erythema. No peritonsillar mass.    Neck:   No stridor. No SubQ emphysema. No meningismus. Hematological/Lymphatic/Immunilogical:   No cervical lymphadenopathy. Cardiovascular:   RRR. Symmetric bilateral radial and DP pulses.  No murmurs.  Respiratory:   Normal respiratory effort without tachypnea nor retractions. Breath sounds are clear and equal bilaterally. No wheezes/rales/rhonchi. Gastrointestinal:   Soft and nontender. Non distended. There is no CVA tenderness.  No rebound, rigidity, or guarding.Gravid uterus, size consistent with dates, nontender Genitourinary:   deferred Musculoskeletal:   Nontender with normal range of motion in all extremities. No joint effusions.  No lower extremity tenderness.  No edema. Neurologic:   Normal speech and language.  CN 2-10 normal. Motor grossly intact. Normal coordination and balance. Normal cerebellar testing. No gross focal neurologic deficits are appreciated.  Skin:    Skin is warm, dry and intact. No rash noted.  No petechiae, purpura, or bullae.  ____________________________________________    LABS (pertinent positives/negatives) (all labs ordered are listed, but only abnormal results are displayed) Labs Reviewed  BASIC METABOLIC PANEL - Abnormal; Notable for the following:    Sodium 134 (*)    Potassium 3.2 (*)    Glucose, Bld 104 (*)    All other components within normal limits  CBC WITH DIFFERENTIAL/PLATELET - Abnormal; Notable for the following:    WBC 11.3 (*)    RBC 3.78 (*)    Hemoglobin 11.8 (*)     HCT 33.9 (*)    Neutro Abs 7.8 (*)    All other components within normal limits  URINALYSIS COMPLETEWITH MICROSCOPIC ([facility] ONLY)   ____________________________________________   EKG    ____________________________________________    RADIOLOGY  MRI/MRA/MRV brain unremarkable. Does show some sinus inflammation  ____________________________________________   PROCEDURES   ____________________________________________   INITIAL IMPRESSION / ASSESSMENT AND PLAN / ED COURSE  Pertinent labs & imaging results that were available during my care of the patient were reviewed by me and considered in my medical decision making (see chart for details).  Patient presents with new headache pattern, associated with lying supine and Valsalva type maneuvers. No evidence of meningitis encephalitis or trauma. Low suspicion for intracranial hemorrhage. Not consistent with preeclampsia. However, her headache pattern is suggestive of thrombosis such as a venous sinus thrombosis. We will obtain MRIs to evaluate. Check labs and urine in the meantime, IV fluids Reglan and Benadryl for the headache symptoms.   ----------------------------------------- 1:40 PM on 09/24/2015 -----------------------------------------  Patient resting comfortably, reports near resolution of her symptoms. Workup negative. We'll discharge home with Reglan and Benadryl and follow-up with obstetrics.    ____________________________________________   FINAL CLINICAL IMPRESSION(S) / ED DIAGNOSES  Final diagnoses:  Headache  Sinus headache       Portions of this note were generated with dragon dictation software. Dictation errors may occur despite best attempts at proofreading.   Sharman CheekPhillip Persia Lintner, MD 09/24/15 1340

## 2015-09-26 LAB — FIRST TRIMESTER SCREEN W/NT
CRL: 72 mm
DIA MOM: 0.74
DIA Value: 188.8 pg/mL
GEST AGE-COLLECT: 13 wk
MATERNAL AGE AT EDD: 26.3 a
NUCHAL TRANSLUCENCY MOM: 1.03
NUCHAL TRANSLUCENCY: 1.3 mm
NUMBER OF FETUSES: 1
PAPP-A MoM: 1.1
PAPP-A VALUE: 1519.4 ng/mL
PDF: 0
TEST RESULTS: NEGATIVE
Weight: 128 [lb_av]
hCG MoM: 1.08
hCG Value: 98 IU/mL

## 2015-10-11 ENCOUNTER — Ambulatory Visit (INDEPENDENT_AMBULATORY_CARE_PROVIDER_SITE_OTHER): Payer: 59 | Admitting: Obstetrics and Gynecology

## 2015-10-11 VITALS — BP 107/70 | HR 90 | Wt 136.9 lb

## 2015-10-11 DIAGNOSIS — Z3492 Encounter for supervision of normal pregnancy, unspecified, second trimester: Secondary | ICD-10-CM

## 2015-10-11 DIAGNOSIS — Z8759 Personal history of other complications of pregnancy, childbirth and the puerperium: Secondary | ICD-10-CM

## 2015-10-11 DIAGNOSIS — Z3482 Encounter for supervision of other normal pregnancy, second trimester: Secondary | ICD-10-CM

## 2015-10-11 LAB — POCT URINALYSIS DIPSTICK
BILIRUBIN UA: NEGATIVE
Blood, UA: NEGATIVE
Glucose, UA: NEGATIVE
KETONES UA: NEGATIVE
LEUKOCYTES UA: NEGATIVE
NITRITE UA: NEGATIVE
Protein, UA: NEGATIVE
SPEC GRAV UA: 1.01
Urobilinogen, UA: NEGATIVE
pH, UA: 8

## 2015-10-11 NOTE — Progress Notes (Signed)
ROB: Patient notes occasional umbilical pain, feels like tugging. Given reassurance, can take Tylenol prn. Now declines genetic testing. Discussed plans for antenatal testing (weekly NSTs) and ultrasound in 3rd trimester due to h/o prior stillborn. For anatomy scan next visit.  RTC in 4 weeks.

## 2015-10-15 ENCOUNTER — Telehealth: Payer: Self-pay | Admitting: Obstetrics and Gynecology

## 2015-10-15 NOTE — Telephone Encounter (Signed)
Pt has a new restriction, she has to work/stand 56 hrs a week.. Pt has to have a note saying that she can't work any more than 45-47 hrs. It's killing there to stand for all those hrs, her work told her she has to have a work note stating she is restricted to work nor more than 45-47 hrs a week.

## 2015-10-15 NOTE — Telephone Encounter (Signed)
Done, please tell pt she can pick up at her leisure. Thanks

## 2015-10-16 NOTE — Telephone Encounter (Signed)
Patient called stating her manager at work needs the note to be clarified. Patient is only wanting to work Monday-Friday 6am-3:30 and off on Saturdays. Her Job is currently on overtime and required to work on Saturdays. She is requesting another note.Thanks

## 2015-10-21 ENCOUNTER — Emergency Department
Admission: EM | Admit: 2015-10-21 | Discharge: 2015-10-21 | Disposition: A | Discharge status: Home / Self Care | Payer: 59 | Attending: Emergency Medicine | Admitting: Emergency Medicine

## 2015-10-21 ENCOUNTER — Encounter: Payer: Self-pay | Admitting: Emergency Medicine

## 2015-10-21 ENCOUNTER — Emergency Department: Payer: 59

## 2015-10-21 DIAGNOSIS — Y9389 Activity, other specified: Secondary | ICD-10-CM | POA: Diagnosis not present

## 2015-10-21 DIAGNOSIS — Y92481 Parking lot as the place of occurrence of the external cause: Secondary | ICD-10-CM | POA: Insufficient documentation

## 2015-10-21 DIAGNOSIS — M25511 Pain in right shoulder: Secondary | ICD-10-CM | POA: Diagnosis not present

## 2015-10-21 DIAGNOSIS — O26892 Other specified pregnancy related conditions, second trimester: Secondary | ICD-10-CM | POA: Diagnosis not present

## 2015-10-21 DIAGNOSIS — Z3A18 18 weeks gestation of pregnancy: Secondary | ICD-10-CM | POA: Insufficient documentation

## 2015-10-21 DIAGNOSIS — Y999 Unspecified external cause status: Secondary | ICD-10-CM | POA: Diagnosis not present

## 2015-10-21 DIAGNOSIS — M25512 Pain in left shoulder: Secondary | ICD-10-CM | POA: Diagnosis present

## 2015-10-21 LAB — URINALYSIS COMPLETE WITH MICROSCOPIC (ARMC ONLY)
Bilirubin Urine: NEGATIVE
GLUCOSE, UA: NEGATIVE mg/dL
Hgb urine dipstick: NEGATIVE
KETONES UR: NEGATIVE mg/dL
Leukocytes, UA: NEGATIVE
NITRITE: NEGATIVE
Protein, ur: 30 mg/dL — AB
SPECIFIC GRAVITY, URINE: 1.021 (ref 1.005–1.030)
pH: 6 (ref 5.0–8.0)

## 2015-10-21 MED ORDER — ACETAMINOPHEN 325 MG PO TABS
650.0000 mg | ORAL_TABLET | Freq: Once | ORAL | Status: AC
  Administered 2015-10-21: 650 mg via ORAL
  Filled 2015-10-21: qty 2
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
## 2015-10-21 NOTE — Discharge Instructions (Signed)
Advised follow-up with OB doctor as needed. Ultrasound today shows no abnormalities

## 2015-10-21 NOTE — ED Notes (Signed)
Per first nurse, Silvio Clayman confirmed with Dr. Mayford Knife that patient is ok to be seen in flex.

## 2015-10-24 ENCOUNTER — Telehealth: Payer: Self-pay | Admitting: Obstetrics and Gynecology

## 2015-10-24 ENCOUNTER — Ambulatory Visit (INDEPENDENT_AMBULATORY_CARE_PROVIDER_SITE_OTHER): Payer: 59 | Admitting: Family Medicine

## 2015-10-24 ENCOUNTER — Encounter: Payer: Self-pay | Admitting: Family Medicine

## 2015-10-24 VITALS — BP 108/68 | HR 88 | Temp 98.6°F | Wt 137.0 lb

## 2015-10-24 DIAGNOSIS — S39012D Strain of muscle, fascia and tendon of lower back, subsequent encounter: Secondary | ICD-10-CM | POA: Diagnosis not present

## 2015-10-24 NOTE — Patient Instructions (Signed)
Follow up as needed

## 2015-10-24 NOTE — Telephone Encounter (Signed)
At this point in time, the patient's pregnancy is uncomplicated and would not be considered a high risk pregnancy.  We will need to leave it up to the patient as to whether or not she will continue working at current job at recommended hours, or find another job.  There is nothing medically necessary that we can write her out of work for at this time.

## 2015-10-24 NOTE — Telephone Encounter (Signed)
Sarah the occupational nurse at [employer] called about [name]. She has some questions.
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
## 2015-10-24 NOTE — Telephone Encounter (Signed)
Spoke with Occupational nurse from pt's employer's office. She states that the pt has been out of work since last Thursday because the employer will not allow pt to work with current restrictions. Pt is currently in a dept that is requiring her to work 60hours per week. Nurse Sarah questions if the pt has any risk factors that would like the pt as high risk or if the pt has any disabilities. Informed Maralyn Sago that to my knowledge the pt has none of the above but will speak with provider to verify.   Please advise.

## 2015-10-25 NOTE — Telephone Encounter (Signed)
Letter has been re-written and faxed to the occupational nurse.

## 2015-10-25 NOTE — Telephone Encounter (Signed)
Called Brittany Page informed her of the information per Dr.Cherry that pt is not high risk. Nurse states that she will call pt and let her know. Pt to call me back with how she wants to proceed.

## 2015-10-25 NOTE — Telephone Encounter (Signed)
Now she wants all of the restrictions off of her back to work letter, b/c she's going to loose her job if they are not taken off... Send to Deatra James fax # [phone]

## 2015-10-26 ENCOUNTER — Emergency Department: Payer: 59

## 2015-10-26 ENCOUNTER — Encounter: Payer: Self-pay | Admitting: Emergency Medicine

## 2015-10-26 ENCOUNTER — Encounter: Payer: Self-pay | Admitting: Obstetrics and Gynecology

## 2015-10-26 ENCOUNTER — Emergency Department
Admission: EM | Admit: 2015-10-26 | Discharge: 2015-10-27 | Disposition: A | Discharge status: Home / Self Care | Payer: 59 | Attending: Emergency Medicine | Admitting: Emergency Medicine

## 2015-10-26 DIAGNOSIS — M545 Low back pain: Secondary | ICD-10-CM | POA: Diagnosis not present

## 2015-10-26 DIAGNOSIS — M7918 Myalgia, other site: Secondary | ICD-10-CM

## 2015-10-26 DIAGNOSIS — Y939 Activity, unspecified: Secondary | ICD-10-CM | POA: Insufficient documentation

## 2015-10-26 DIAGNOSIS — Y92410 Unspecified street and highway as the place of occurrence of the external cause: Secondary | ICD-10-CM | POA: Diagnosis not present

## 2015-10-26 DIAGNOSIS — Y999 Unspecified external cause status: Secondary | ICD-10-CM | POA: Insufficient documentation

## 2015-10-26 DIAGNOSIS — M791 Myalgia: Secondary | ICD-10-CM | POA: Insufficient documentation

## 2015-10-26 DIAGNOSIS — O26892 Other specified pregnancy related conditions, second trimester: Secondary | ICD-10-CM | POA: Diagnosis not present

## 2015-10-26 DIAGNOSIS — T1490XA Injury, unspecified, initial encounter: Secondary | ICD-10-CM

## 2015-10-26 DIAGNOSIS — R0789 Other chest pain: Secondary | ICD-10-CM | POA: Diagnosis present

## 2015-10-26 DIAGNOSIS — Z3A18 18 weeks gestation of pregnancy: Secondary | ICD-10-CM | POA: Diagnosis not present

## 2015-10-26 MED ORDER — ACETAMINOPHEN 500 MG PO TABS
1000.0000 mg | ORAL_TABLET | Freq: Once | ORAL | Status: AC
  Administered 2015-10-26: 1000 mg via ORAL
  Filled 2015-10-26: qty 2
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
## 2015-10-26 NOTE — ED Notes (Signed)
Pt's fetal heart tones 148 bpm
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
## 2015-10-27 ENCOUNTER — Inpatient Hospital Stay
Admission: EM | Admit: 2015-10-27 | Discharge: 2015-10-27 | Disposition: A | Discharge status: Home / Self Care | Payer: 59 | Source: Home / Self Care | Admitting: Obstetrics and Gynecology

## 2015-10-27 ENCOUNTER — Encounter: Payer: Self-pay | Admitting: *Deleted

## 2015-10-27 DIAGNOSIS — R079 Chest pain, unspecified: Secondary | ICD-10-CM

## 2015-10-27 DIAGNOSIS — M545 Low back pain: Secondary | ICD-10-CM

## 2015-10-27 DIAGNOSIS — O26892 Other specified pregnancy related conditions, second trimester: Secondary | ICD-10-CM

## 2015-10-27 DIAGNOSIS — Z3A19 19 weeks gestation of pregnancy: Secondary | ICD-10-CM

## 2015-10-27 DIAGNOSIS — M542 Cervicalgia: Secondary | ICD-10-CM | POA: Insufficient documentation

## 2015-10-27 MED ORDER — OXYCODONE-ACETAMINOPHEN 5-325 MG PO TABS: 1.0000 | ORAL_TABLET | ORAL | Status: DC | PRN

## 2015-10-27 NOTE — ED Notes (Signed)
Reviewed d/c instructions, follow-up care, use of ice/elevation with pt. Informed pt that she would be d/c'd from ED and immediately readmitted to OB 4 upstairs. Gave report to L&D RN Hale Bogus.  Pt verbalized understanding.
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
## 2015-11-01 ENCOUNTER — Telehealth: Payer: Self-pay | Admitting: Family Medicine

## 2015-11-01 DIAGNOSIS — S134XXD Sprain of ligaments of cervical spine, subsequent encounter: Secondary | ICD-10-CM
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
## 2015-11-01 NOTE — Telephone Encounter (Signed)
Referral generated

## 2015-11-01 NOTE — Telephone Encounter (Signed)
Patient states she is still hurting very badly and Tylenol is not helping , she would like to go to Physical Therapy

## 2015-11-01 NOTE — Telephone Encounter (Signed)
Routed to Dr Johnson.

## 2015-11-06 ENCOUNTER — Encounter: Payer: Self-pay | Admitting: Physical Therapy

## 2015-11-06 ENCOUNTER — Ambulatory Visit (INDEPENDENT_AMBULATORY_CARE_PROVIDER_SITE_OTHER): Payer: 59

## 2015-11-06 ENCOUNTER — Ambulatory Visit (INDEPENDENT_AMBULATORY_CARE_PROVIDER_SITE_OTHER): Payer: 59 | Admitting: Obstetrics and Gynecology

## 2015-11-06 VITALS — BP 97/62 | HR 87 | Wt 144.8 lb

## 2015-11-06 DIAGNOSIS — Z3482 Encounter for supervision of other normal pregnancy, second trimester: Secondary | ICD-10-CM

## 2015-11-06 DIAGNOSIS — Z8759 Personal history of other complications of pregnancy, childbirth and the puerperium: Secondary | ICD-10-CM

## 2015-11-06 DIAGNOSIS — Z3492 Encounter for supervision of normal pregnancy, unspecified, second trimester: Secondary | ICD-10-CM

## 2015-11-06 LAB — POCT URINALYSIS DIPSTICK
BILIRUBIN UA: NEGATIVE
Blood, UA: NEGATIVE
Glucose, UA: NEGATIVE
KETONES UA: NEGATIVE
LEUKOCYTES UA: NEGATIVE
Nitrite, UA: NEGATIVE
PH UA: 6
Protein, UA: NEGATIVE
SPEC GRAV UA: 1.02
Urobilinogen, UA: 0.2

## 2015-11-07 ENCOUNTER — Encounter: Payer: Self-pay | Admitting: Physical Therapy

## 2015-11-07 ENCOUNTER — Ambulatory Visit: Payer: 59 | Attending: Family Medicine | Admitting: Physical Therapy

## 2015-11-07 DIAGNOSIS — M546 Pain in thoracic spine: Secondary | ICD-10-CM | POA: Insufficient documentation

## 2015-11-07 DIAGNOSIS — M542 Cervicalgia: Secondary | ICD-10-CM | POA: Diagnosis not present

## 2015-11-07 DIAGNOSIS — M62838 Other muscle spasm: Secondary | ICD-10-CM | POA: Insufficient documentation
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
## 2015-11-10 NOTE — Progress Notes (Signed)
ROB:  No complaints or concerns. S/p normal anatomy scan.  Routine obstetric precautions reviewed. RTC in 4 weeks.

## 2015-11-13 ENCOUNTER — Ambulatory Visit: Payer: 59 | Admitting: Physical Therapy

## 2015-11-13 ENCOUNTER — Encounter: Payer: Self-pay | Admitting: Physical Therapy

## 2015-11-13 DIAGNOSIS — M546 Pain in thoracic spine: Secondary | ICD-10-CM

## 2015-11-13 DIAGNOSIS — M542 Cervicalgia: Secondary | ICD-10-CM | POA: Diagnosis not present

## 2015-11-13 DIAGNOSIS — M62838 Other muscle spasm: Secondary | ICD-10-CM
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
## 2015-11-28 ENCOUNTER — Encounter: Payer: Self-pay | Admitting: Physical Therapy

## 2015-11-28 ENCOUNTER — Ambulatory Visit: Payer: 59 | Attending: Family Medicine | Admitting: Physical Therapy

## 2015-11-28 DIAGNOSIS — M546 Pain in thoracic spine: Secondary | ICD-10-CM

## 2015-11-28 DIAGNOSIS — M62838 Other muscle spasm: Secondary | ICD-10-CM | POA: Diagnosis present

## 2015-11-28 DIAGNOSIS — M542 Cervicalgia: Secondary | ICD-10-CM | POA: Diagnosis present
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
## 2015-12-11 ENCOUNTER — Ambulatory Visit (INDEPENDENT_AMBULATORY_CARE_PROVIDER_SITE_OTHER): Payer: Self-pay | Admitting: Obstetrics and Gynecology

## 2015-12-11 ENCOUNTER — Encounter: Payer: 59 | Admitting: Physical Therapy

## 2015-12-11 VITALS — BP 104/50 | HR 105 | Wt 154.7 lb

## 2015-12-11 DIAGNOSIS — Z8759 Personal history of other complications of pregnancy, childbirth and the puerperium: Secondary | ICD-10-CM

## 2015-12-11 DIAGNOSIS — O2602 Excessive weight gain in pregnancy, second trimester: Secondary | ICD-10-CM

## 2015-12-11 DIAGNOSIS — Z131 Encounter for screening for diabetes mellitus: Secondary | ICD-10-CM

## 2015-12-11 DIAGNOSIS — Z3482 Encounter for supervision of other normal pregnancy, second trimester: Secondary | ICD-10-CM

## 2015-12-11 DIAGNOSIS — Z0289 Encounter for other administrative examinations: Secondary | ICD-10-CM

## 2015-12-11 LAB — POCT URINALYSIS DIPSTICK
BILIRUBIN UA: NEGATIVE
Blood, UA: NEGATIVE
GLUCOSE UA: NEGATIVE
KETONES UA: NEGATIVE
Leukocytes, UA: NEGATIVE
Nitrite, UA: NEGATIVE
Protein, UA: NEGATIVE
SPEC GRAV UA: 1.025
Urobilinogen, UA: NEGATIVE
pH, UA: 6

## 2015-12-12 NOTE — Progress Notes (Signed)
ROB: Doing well.  Patient notes anxiety about current pregnancy, notes getting closer to third trimester where she lost last pregnancy. Offered reassurance, reiterated plan for antenatal testing and surveillance.  Will begin weekly NSTs at 30-32 weeks, ultrasound at 32 weeks. Discussed fetal kick counts beginning at 28 weeks. Cautioned on excessive weight gain in pregnancy (TWG 34 lbs). Given info on healthy dietary modifications, portion control.  Will also refer to Lifestyles if further excess weight gain continues.  RTC in 3-4 weeks. For 28 week labs at that time, flu vaccine.

## 2015-12-17 ENCOUNTER — Ambulatory Visit: Payer: 59 | Admitting: Physical Therapy

## 2015-12-17 ENCOUNTER — Encounter: Payer: Self-pay | Admitting: Physical Therapy

## 2015-12-17 DIAGNOSIS — M542 Cervicalgia: Secondary | ICD-10-CM

## 2015-12-17 DIAGNOSIS — M62838 Other muscle spasm: Secondary | ICD-10-CM

## 2015-12-17 DIAGNOSIS — M546 Pain in thoracic spine: Secondary | ICD-10-CM
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
## 2015-12-21 ENCOUNTER — Telehealth: Payer: Self-pay | Admitting: Obstetrics and Gynecology

## 2015-12-21 NOTE — Telephone Encounter (Signed)
This pt left work early today and they told her that she needed an updated FMLA for leaving when she don't feel good. PLEASE CALL PATIENT!

## 2015-12-21 NOTE — Telephone Encounter (Signed)
Pt just needs to have another FMLA form faxed over for intermittent leave. She does not need to pay again. Thanks

## 2015-12-26 ENCOUNTER — Telehealth: Payer: Self-pay | Admitting: Obstetrics and Gynecology

## 2015-12-26 ENCOUNTER — Observation Stay
Admission: EM | Admit: 2015-12-26 | Discharge: 2015-12-26 | Disposition: A | Discharge status: Home / Self Care | Payer: 59 | Attending: Obstetrics and Gynecology | Admitting: Obstetrics and Gynecology

## 2015-12-26 DIAGNOSIS — B373 Candidiasis of vulva and vagina: Secondary | ICD-10-CM | POA: Diagnosis present

## 2015-12-26 DIAGNOSIS — O368190 Decreased fetal movements, unspecified trimester, not applicable or unspecified: Secondary | ICD-10-CM | POA: Diagnosis not present

## 2015-12-26 DIAGNOSIS — O368120 Decreased fetal movements, second trimester, not applicable or unspecified: Principal | ICD-10-CM | POA: Insufficient documentation

## 2015-12-26 DIAGNOSIS — Z3A27 27 weeks gestation of pregnancy: Secondary | ICD-10-CM | POA: Diagnosis not present

## 2015-12-26 LAB — URINALYSIS COMPLETE WITH MICROSCOPIC (ARMC ONLY)
BACTERIA UA: NONE SEEN
BILIRUBIN URINE: NEGATIVE
Glucose, UA: NEGATIVE mg/dL
Hgb urine dipstick: NEGATIVE
Ketones, ur: NEGATIVE mg/dL
Leukocytes, UA: NEGATIVE
Nitrite: NEGATIVE
PH: 6 (ref 5.0–8.0)
PROTEIN: NEGATIVE mg/dL
Specific Gravity, Urine: 1.017 (ref 1.005–1.030)

## 2015-12-26 MED ORDER — ACETAMINOPHEN 325 MG PO TABS: 650.0000 mg | ORAL_TABLET | ORAL | Status: DC | PRN

## 2015-12-26 MED ORDER — MICONAZOLE NITRATE 2 % VA CREA: 1.0000 | TOPICAL_CREAM | Freq: Every day | VAGINAL | 0 refills | Status: DC

## 2015-12-26 NOTE — Plan of Care (Signed)
Pt seen by dr Valentino Saxoncherry. Urine lab results reviewed. [facility] [place] by escript. Pt d/c home with d/c instructions.

## 2015-12-26 NOTE — Final Progress Note (Signed)
L&D OB Triage Note  HPI:  Brittany Page is a 25 y.o. 802P0100 female at 3432w0d. Estimated Date of Delivery: 03/26/16 who presents for complaints of decreased fetal movement x 1 day.  Notes that she was at work and noticed that fetus wasn't moving.  Tried to drink apple juice and ate crackers and noted that she still did not feel movement. States that she was very anxious as she has a h/o prior preterm loss in 3rd trimester. Denies contractions, vaginal bleeding, LOF.     OB History  Gravida Para Term Preterm AB Living  2 1 0 1 0 0  SAB TAB Ectopic Multiple Live Births  0 0 0 0      # Outcome Date GA Lbr Len/2nd Weight Sex Delivery Anes PTL Lv  2 Current           1 Preterm 05/03/14 6364w0d   M Vag-Spont EPI N FD    Obstetric Comments  05/03/2014 stillbirth (cord twisted), unsure this was why.    Patient Active Problem List   Diagnosis Date Noted  . Decreased fetal movement 12/26/2015  . History of unexplained stillbirth 08/01/2015  . Anemia 01/15/2015  . Miscarriage within last 12 months 01/15/2015    Past Medical History:  Diagnosis Date  . Anemia 01/15/2015  . Low hemoglobin   . Miscarriage 04/2014   lost at 8 month gestation (still born)    No current facility-administered medications on file prior to encounter.    Current Outpatient Prescriptions on File Prior to Encounter  Medication Sig Dispense Refill  . Acetaminophen 325 MG CAPS Take 650 mg by mouth every 6 (six) hours as needed (for pain/fever). Reported on 10/11/2015    . Prenatal Vit-Fe Fumarate-FA (PRENATAL VITAMIN) 27-0.8 MG TABS Take 1 tablet by mouth daily. (Patient taking differently: Take 1 tablet by mouth at bedtime. ) 30 tablet 12    No Known Allergies   ROS:  Review of Systems - Negative except what's noted in HPI, and vaginal discharge, thin, watery.    Physical Exam:  Temperature 98.1 F (36.7 C), last menstrual period 05/30/2015. General appearance: alert and no distress Abdomen: soft, non-tender;  bowel sounds normal; no masses,  no organomegaly.  Gravid.  Pelvic:  external genitalia normal, rectovaginal septum normal.  Vagina with tenderness, small amount of thick white clumpy discharge along with a thinner white discharge.  Cervix normal appearing, no lesions and no motion tenderness, closed.   Extremities: extremities normal, atraumatic, no cyanosis or edema   NST INTERPRETATION: Indications: decreased fetal movement    Baseline Rate (A): 152 bpm  Accelerations: Present (10 by 10) Decelerations: Absent Variability: moderate       Contraction Frequency (min): 5  Impression: reactive   Assessment:  25 y.o. G2P0100 at 432w0d with:  1.  Decreased fetal movement with h/o prior fetal demise 2. Preterm contractions 3. Yeast vaginitis    Plan:  1. Fetus very reactive on monitor.  Patient notes feeling more movement once arriving to triage. Discussed fetal kick counts.  2. Preterm contractions - 2 contractions noted ~ 5 min apart at initial presentation, followed by uterine irritability but no further contractions. Discussed adequate hydration (patient's mother notes that patient does not drink much water).  No evidence of preterm labor. Given reassurance.  3. Yeast vaginitis - may also be a cause of uterine irritability.  Will treat with OTC Monistat. 4. Continue routine prenatal care. To f/u with OB/GYN as previously scheduled.    Toni AmendAnika  Brittany Mates, MD [facility]

## 2015-12-26 NOTE — Plan of Care (Signed)
Pt presents to l/d with c/o decreased fetal movement. Pt has been at work. States she had a bagel and a donut for breakfast, no lunch eaten. Pt also states she has been drinking more sweet tea and juice than water. Pt has hx of stillbirth so is anxious. efm applied. fhr obtained.

## 2015-12-26 NOTE — Telephone Encounter (Signed)
Patient called stating she is experiencing increased discharge and she feels like the baby isnt moving quite as much as usual but still feeling frequent movements.

## 2015-12-26 NOTE — Telephone Encounter (Signed)
nevermind pt is going to to ER

## 2015-12-27 NOTE — Telephone Encounter (Signed)
[facility]

## 2016-01-08 ENCOUNTER — Ambulatory Visit (INDEPENDENT_AMBULATORY_CARE_PROVIDER_SITE_OTHER): Payer: 59 | Admitting: Obstetrics and Gynecology

## 2016-01-08 ENCOUNTER — Other Ambulatory Visit: Payer: 59

## 2016-01-08 VITALS — BP 113/70 | HR 96 | Wt 161.1 lb

## 2016-01-08 DIAGNOSIS — Z3491 Encounter for supervision of normal pregnancy, unspecified, first trimester: Secondary | ICD-10-CM

## 2016-01-08 DIAGNOSIS — O2602 Excessive weight gain in pregnancy, second trimester: Secondary | ICD-10-CM

## 2016-01-08 DIAGNOSIS — Z131 Encounter for screening for diabetes mellitus: Secondary | ICD-10-CM

## 2016-01-08 DIAGNOSIS — Z8759 Personal history of other complications of pregnancy, childbirth and the puerperium: Secondary | ICD-10-CM

## 2016-01-08 DIAGNOSIS — Z3483 Encounter for supervision of other normal pregnancy, third trimester: Secondary | ICD-10-CM

## 2016-01-08 LAB — POCT URINALYSIS DIPSTICK
BILIRUBIN UA: NEGATIVE
GLUCOSE UA: NEGATIVE
Ketones, UA: NEGATIVE
Nitrite, UA: NEGATIVE
RBC UA: NEGATIVE
SPEC GRAV UA: 1.025
UROBILINOGEN UA: NEGATIVE
pH, UA: 6

## 2016-01-08 MED ORDER — TETANUS-DIPHTH-ACELL PERTUSSIS 5-2.5-18.5 LF-MCG/0.5 IM SUSP: 0.5000 mL | Freq: Once | INTRAMUSCULAR | Status: DC

## 2016-01-08 NOTE — Patient Instructions (Signed)
Nonstress Test  The nonstress test is a procedure that monitors the fetus's heartbeat. The test will monitor the heartbeat when the fetus is at rest and while the fetus is moving. In a healthy fetus, there will be an increase in fetal heart rate when the fetus moves or kicks. The heart rate will decrease at rest. This test helps determine if the fetus is healthy. Your health care provider will look at a number of patterns in the heart rate tracing to make sure your baby is thriving. If there is concern, your health care provider may order additional tests or may suggest another course of action. This test is often done in the third trimester and can help determine if an early delivery is needed and safe. Common reasons to have this test are:  · You are past your due date.  · You have a high-risk pregnancy.  · You are feeling less movement than normal.  · You have lost a pregnancy in the past.  · Your health care provider suspects fetal growth problems.  · You have too much or too little amniotic fluid.  BEFORE THE PROCEDURE  · Eat a meal right before the test or as directed by your health care provider. Food may help stimulate fetal movements.  · Use the restroom right before the test.  PROCEDURE  · Two belts will be placed around your abdomen. These belts have monitors attached to them. One records the fetal heart rate and the other records uterine contractions.  · You may be asked to lie down on your side or to stay sitting upright.  · You may be given a button to press when you feel movement.  · The fetal heartbeat is listened to and watched on a screen. The heartbeat is recorded on a sheet of paper.  · If the fetus seems to be sleeping, you may be asked to drink some juice or soda, gently press your abdomen, or make some noise to wake the fetus.  AFTER THE PROCEDURE   Your health care provider will discuss the test results with you and make recommendations for the near future.     This information is not  intended to replace advice given to you by your health care provider. Make sure you discuss any questions you have with your health care provider.     Document Released: 02/28/2002 Document Revised: 03/31/2014 Document Reviewed: 04/13/2012  Elsevier Interactive Patient Education ©2016 Elsevier Inc[employer]

## 2016-01-08 NOTE — Progress Notes (Signed)
ROB: Patient denies complaints. For 28 week labs today.  Desires to to breastfeed, unsure of desires for contraception. For flu vacine today, signed blood consent, discussed cord blood banking. Received Tdap with PCP in May 2017. Discussed excessive weight gain in pregnancy, will refer to nutritionist. RTC in 2 weeks. To begin weekly NSTs at next visit for h/o prior stillbirth, also to have growth scan at 32 weeks. Declines flu vaccine.

## 2016-01-09 LAB — CBC
HEMATOCRIT: 32.5 % — AB (ref 34.0–46.6)
Hemoglobin: 11.2 g/dL (ref 11.1–15.9)
MCH: 31.1 pg (ref 26.6–33.0)
MCHC: 34.5 g/dL (ref 31.5–35.7)
MCV: 90 fL (ref 79–97)
PLATELETS: 200 10*3/uL (ref 150–379)
RBC: 3.6 x10E6/uL — AB (ref 3.77–5.28)
RDW: 13 % (ref 12.3–15.4)
WBC: 10 10*3/uL (ref 3.4–10.8)

## 2016-01-09 LAB — GLUCOSE, 1 HOUR GESTATIONAL: GESTATIONAL DIABETES SCREEN: 106 mg/dL (ref 65–139)

## 2016-01-11 LAB — RUBELLA SCREEN: RUBELLA: 2.05 {index} (ref >0.99)

## 2016-01-11 LAB — VARICELLA ZOSTER ANTIBODY, IGG: Varicella zoster IgG: 467 index (ref >165)

## 2016-01-18 ENCOUNTER — Telehealth: Payer: Self-pay | Admitting: Obstetrics and Gynecology

## 2016-01-18 NOTE — Telephone Encounter (Signed)
Called pt she states that she has had a really bad cold x 2 days. Has tried nothing for relief. Advised pt on the use of Mucinex (short term) Robitussin, and Sudafed. Advised pt to push fluids, monitor for fevers, call back if no relief.

## 2016-01-18 NOTE — Telephone Encounter (Signed)
Patient called stating she is not feeling well. She is very congested, sneezing, coughing. She states every time she coughs her chest hurts.

## 2016-01-22 ENCOUNTER — Other Ambulatory Visit: Payer: 59

## 2016-01-22 ENCOUNTER — Ambulatory Visit (INDEPENDENT_AMBULATORY_CARE_PROVIDER_SITE_OTHER): Payer: 59 | Admitting: Obstetrics and Gynecology

## 2016-01-22 VITALS — BP 110/59 | HR 104 | Wt 165.6 lb

## 2016-01-22 DIAGNOSIS — Z1389 Encounter for screening for other disorder: Secondary | ICD-10-CM

## 2016-01-22 DIAGNOSIS — Z369 Encounter for antenatal screening, unspecified: Secondary | ICD-10-CM

## 2016-01-22 DIAGNOSIS — Z8759 Personal history of other complications of pregnancy, childbirth and the puerperium: Secondary | ICD-10-CM

## 2016-01-22 DIAGNOSIS — O0993 Supervision of high risk pregnancy, unspecified, third trimester: Secondary | ICD-10-CM | POA: Insufficient documentation

## 2016-01-22 NOTE — Progress Notes (Signed)
NONSTRESS TEST INTERPRETATION  INDICATIONS:  H/o prior still birth  FHR baseline: 150 RESULTS: reactive COMMENTS: NST B/P's:  B/P- 110/50  P-104;   B/P-101/76    P-105.  PLAN: 1. Continue fetal kick counts twice a day. 2. Continue antepartum testing as scheduled-weekly 3. F/u as scheduled.  Fenton Mallingebbie Cru Kritikos, LPN

## 2016-01-22 NOTE — Progress Notes (Signed)
ROB: Patient denies complaints. Normal 28 week labs. NST performed today was reviewed and was found to be reactive.  Continue recommended antenatal testing and prenatal care. Continue fetal kick counts. RTC in 2 weeks. For growth scan at that time.

## 2016-01-24 ENCOUNTER — Ambulatory Visit: Payer: 59 | Admitting: Dietician

## 2016-02-05 ENCOUNTER — Ambulatory Visit (INDEPENDENT_AMBULATORY_CARE_PROVIDER_SITE_OTHER): Payer: 59

## 2016-02-05 ENCOUNTER — Encounter: Payer: Self-pay | Admitting: Obstetrics and Gynecology

## 2016-02-05 ENCOUNTER — Ambulatory Visit (INDEPENDENT_AMBULATORY_CARE_PROVIDER_SITE_OTHER): Payer: 59 | Admitting: Obstetrics and Gynecology

## 2016-02-05 VITALS — BP 112/64 | HR 114 | Wt 169.3 lb

## 2016-02-05 DIAGNOSIS — Z8759 Personal history of other complications of pregnancy, childbirth and the puerperium: Secondary | ICD-10-CM | POA: Diagnosis not present

## 2016-02-05 DIAGNOSIS — Z369 Encounter for antenatal screening, unspecified: Secondary | ICD-10-CM | POA: Diagnosis not present

## 2016-02-05 DIAGNOSIS — N898 Other specified noninflammatory disorders of vagina: Secondary | ICD-10-CM

## 2016-02-05 DIAGNOSIS — Z1389 Encounter for screening for other disorder: Secondary | ICD-10-CM

## 2016-02-05 DIAGNOSIS — O26893 Other specified pregnancy related conditions, third trimester: Secondary | ICD-10-CM

## 2016-02-05 LAB — POCT URINALYSIS DIPSTICK
Bilirubin, UA: NEGATIVE
Glucose, UA: NEGATIVE
Ketones, UA: NEGATIVE
Leukocytes, UA: NEGATIVE
NITRITE UA: NEGATIVE
PH UA: 6
Protein, UA: NEGATIVE
RBC UA: NEGATIVE
Spec Grav, UA: 1.015
UROBILINOGEN UA: NEGATIVE

## 2016-02-05 NOTE — Progress Notes (Signed)
ROB: Patient notes vaginal discharge, thinks she has a yeast infection. Unable to tolerate speculum exam, blind Nuswab performed.  Growth scan today wnl. NST performed today was reviewed and was found to be reactive.  Continue recommended antenatal testing and prenatal care. Now for weekly NSTs, will repeat growth scan at ~ 36 weeks.

## 2016-02-05 NOTE — Progress Notes (Signed)
NONSTRESS TEST INTERPRETATION  INDICATIONS: H/O prior stillbirth  FHR baseline130-140 RESULTS: reactive COMMENTS: B/P-112/66  P-105,  B/P-105/58  P-106     PLAN: 1. Continue fetal kick counts twice a day. 2. Continue antepartum testing as scheduled-Biweekly 3. RTO as scheduled  Fenton Mallingebbie Yaremi Stahlman, LPN

## 2016-02-06 NOTE — Addendum Note (Signed)
Addended by: Debbe BalesGLANTON, Jasmyn Picha C on: 02/06/2016 11:50 AM   Modules accepted: Orders

## 2016-02-08 ENCOUNTER — Encounter: Payer: Self-pay | Admitting: Obstetrics and Gynecology

## 2016-02-09 LAB — NUSWAB VAGINITIS PLUS (VG+)
CANDIDA ALBICANS, NAA: NEGATIVE
CANDIDA GLABRATA, NAA: NEGATIVE
Chlamydia trachomatis, NAA: NEGATIVE
Neisseria gonorrhoeae, NAA: NEGATIVE
TRICH VAG BY NAA: NEGATIVE

## 2016-02-11 ENCOUNTER — Other Ambulatory Visit: Payer: Self-pay

## 2016-02-11 DIAGNOSIS — N760 Acute vaginitis: Principal | ICD-10-CM

## 2016-02-11 DIAGNOSIS — B9689 Other specified bacterial agents as the cause of diseases classified elsewhere: Secondary | ICD-10-CM

## 2016-02-11 MED ORDER — METRONIDAZOLE 500 MG PO TABS: 500.0000 mg | ORAL_TABLET | Freq: Two times a day (BID) | ORAL | 0 refills | Status: DC

## 2016-02-19 ENCOUNTER — Other Ambulatory Visit: Payer: 59

## 2016-02-19 ENCOUNTER — Ambulatory Visit (INDEPENDENT_AMBULATORY_CARE_PROVIDER_SITE_OTHER): Payer: 59 | Admitting: Obstetrics and Gynecology

## 2016-02-19 ENCOUNTER — Encounter: Payer: Self-pay | Admitting: Obstetrics and Gynecology

## 2016-02-19 VITALS — BP 115/63 | HR 106 | Wt 172.0 lb

## 2016-02-19 DIAGNOSIS — O0993 Supervision of high risk pregnancy, unspecified, third trimester: Secondary | ICD-10-CM | POA: Diagnosis not present

## 2016-02-19 DIAGNOSIS — O2600 Excessive weight gain in pregnancy, unspecified trimester: Secondary | ICD-10-CM

## 2016-02-19 DIAGNOSIS — Z8759 Personal history of other complications of pregnancy, childbirth and the puerperium: Secondary | ICD-10-CM

## 2016-02-19 DIAGNOSIS — Z369 Encounter for antenatal screening, unspecified: Secondary | ICD-10-CM

## 2016-02-19 DIAGNOSIS — Z1389 Encounter for screening for other disorder: Secondary | ICD-10-CM

## 2016-02-19 LAB — POCT URINALYSIS DIPSTICK
Bilirubin, UA: NEGATIVE
Glucose, UA: NEGATIVE
Ketones, UA: NEGATIVE
LEUKOCYTES UA: NEGATIVE
NITRITE UA: NEGATIVE
PH UA: 6
PROTEIN UA: NEGATIVE
RBC UA: NEGATIVE
Spec Grav, UA: 1.01
UROBILINOGEN UA: NEGATIVE

## 2016-02-19 NOTE — Progress Notes (Signed)
NONSTRESS TEST INTERPRETATION  INDICATIONS: H/O prior stillbirth  FHR baseline:  150 RESULTS: reactive COMMENTS: B/P-113/59   P-90,  B/P-110/72  P-9,1 B/P-124/63  P-94      PLAN: 1. Continue fetal kick counts twice a day. 2. Continue antepartum testing as scheduled-Biweekly 3. RTO as scheduled.  Fenton Mallingebbie Ermal Brzozowski, LPN

## 2016-02-20 DIAGNOSIS — O2600 Excessive weight gain in pregnancy, unspecified trimester: Secondary | ICD-10-CM | POA: Insufficient documentation

## 2016-02-20 NOTE — Progress Notes (Signed)
ROB: Patient notes pelvic pressure.  Denies ctx. NST performed today was reviewed and was found to be reactive.  Continue recommended antenatal testing and prenatal care.  Patient for repeat growth scan in 2 weeks.  Discussed plans for delivery at no later than 40 weeks. Continue fetal kick counts. Patient did not go to nutritionist.  Continued to stress no further weight gain in pregnancy.

## 2016-02-25 ENCOUNTER — Ambulatory Visit (INDEPENDENT_AMBULATORY_CARE_PROVIDER_SITE_OTHER): Payer: 59 | Admitting: Obstetrics and Gynecology

## 2016-02-25 VITALS — BP 118/57 | HR 117 | Wt 174.4 lb

## 2016-02-25 DIAGNOSIS — Z131 Encounter for screening for diabetes mellitus: Secondary | ICD-10-CM

## 2016-02-25 DIAGNOSIS — Z369 Encounter for antenatal screening, unspecified: Secondary | ICD-10-CM

## 2016-02-25 DIAGNOSIS — Z1389 Encounter for screening for other disorder: Secondary | ICD-10-CM

## 2016-02-25 DIAGNOSIS — Z8759 Personal history of other complications of pregnancy, childbirth and the puerperium: Secondary | ICD-10-CM

## 2016-02-25 LAB — POCT URINALYSIS DIPSTICK
Bilirubin, UA: NEGATIVE
Blood, UA: NEGATIVE
Glucose, UA: NEGATIVE
KETONES UA: NEGATIVE
Leukocytes, UA: NEGATIVE
Nitrite, UA: NEGATIVE
PH UA: 6
SPEC GRAV UA: 1.025
Urobilinogen, UA: NEGATIVE

## 2016-02-25 NOTE — Progress Notes (Signed)
NONSTRESS TEST INTERPRETATION  INDICATIONS: H/O prior still birth  FHR baseline: 140-150 RESULTS: reactive COMMENTS: B/P-118/70, P-117;  B/P-120/67; P-113. Pt states she feels like the baby has dropped that he is in her pelvis. Reassure given. Went over s/s of labor. Pt encouraged to contact office if needed before next appt.   PLAN: 1. Continue fetal kick counts twice a day. 2. Continue antepartum testing as scheduled-Biweekly 3. RTO as scheduled  Fenton Mallingebbie Pheobe Sandiford, LPN

## 2016-03-04 ENCOUNTER — Other Ambulatory Visit: Payer: 59

## 2016-03-05 ENCOUNTER — Ambulatory Visit (INDEPENDENT_AMBULATORY_CARE_PROVIDER_SITE_OTHER): Payer: 59

## 2016-03-05 ENCOUNTER — Ambulatory Visit (INDEPENDENT_AMBULATORY_CARE_PROVIDER_SITE_OTHER): Payer: 59 | Admitting: Obstetrics and Gynecology

## 2016-03-05 ENCOUNTER — Encounter: Payer: Self-pay | Admitting: Obstetrics and Gynecology

## 2016-03-05 ENCOUNTER — Other Ambulatory Visit: Payer: 59

## 2016-03-05 VITALS — BP 109/63 | HR 111 | Wt 174.3 lb

## 2016-03-05 DIAGNOSIS — Z1389 Encounter for screening for other disorder: Secondary | ICD-10-CM

## 2016-03-05 DIAGNOSIS — Z8759 Personal history of other complications of pregnancy, childbirth and the puerperium: Secondary | ICD-10-CM | POA: Diagnosis not present

## 2016-03-05 DIAGNOSIS — Z369 Encounter for antenatal screening, unspecified: Secondary | ICD-10-CM

## 2016-03-05 DIAGNOSIS — Z113 Encounter for screening for infections with a predominantly sexual mode of transmission: Secondary | ICD-10-CM

## 2016-03-05 NOTE — Progress Notes (Signed)
ROB & NST- NST performed today was reviewed and was found to be reactive. Baseline 135  with Moderate variability; No decels noted.  Continue recommended antenatal testing and prenatal care. Cultures obtained, labor precautions discussed

## 2016-03-05 NOTE — Progress Notes (Signed)
NONSTRESS TEST INTERPRETATION  INDICATIONS: H/O prior stillbirth  FHR baseline: 150 RESULTS: reactive COMMENTS:  C/O lower abdominal pressure, no vaginal bleeding, or leaking of vaginal fluid. Contractions irregular. Forgot and voided in the toilet. B/P's: B/P-104/64, P-113,  B/P-103/63, P-111, B/P-118/77, P-109,  B/P-111/65, P113   PLAN: 1. Continue fetal kick counts twice a day. 2. Continue antepartum testing as scheduled-Biweekly 3. RTO as scheduled  Fenton Mallingebbie Ariyanah Aguado, LPN

## 2016-03-09 LAB — CULTURE, BETA STREP (GROUP B ONLY): STREP GP B CULTURE: NEGATIVE

## 2016-03-11 LAB — GC/CHLAMYDIA PROBE AMP
Chlamydia trachomatis, NAA: NEGATIVE
NEISSERIA GONORRHOEAE BY PCR: NEGATIVE

## 2016-03-12 ENCOUNTER — Encounter: Payer: Self-pay | Admitting: Obstetrics and Gynecology

## 2016-03-12 ENCOUNTER — Ambulatory Visit (INDEPENDENT_AMBULATORY_CARE_PROVIDER_SITE_OTHER): Payer: 59 | Admitting: Obstetrics and Gynecology

## 2016-03-12 VITALS — BP 120/67 | HR 99 | Wt 179.8 lb

## 2016-03-12 DIAGNOSIS — Z3483 Encounter for supervision of other normal pregnancy, third trimester: Secondary | ICD-10-CM | POA: Diagnosis not present

## 2016-03-12 DIAGNOSIS — Z8759 Personal history of other complications of pregnancy, childbirth and the puerperium: Secondary | ICD-10-CM

## 2016-03-12 LAB — POCT URINALYSIS DIPSTICK
BILIRUBIN UA: NEGATIVE
Glucose, UA: NEGATIVE
Ketones, UA: NEGATIVE
Leukocytes, UA: NEGATIVE
NITRITE UA: NEGATIVE
PH UA: 6
Protein, UA: NEGATIVE
RBC UA: NEGATIVE
SPEC GRAV UA: 1.015
Urobilinogen, UA: NEGATIVE

## 2016-03-12 NOTE — Progress Notes (Signed)
NONSTRESS TEST INTERPRETATION  INDICATIONS: H/O Stillbirth  FHR baseline: 150 RESULTS: reactive NST COMMENTS:  Contractions x2. ?leaking fluid, sticky at one point and clear. Wearing pad. Started this today. B/P-118/74, P-98;  B/P-122/64, P-103. Nitrazine negative Cervix: 2/50%/posterior/-3/vertex/BSO WI   PLAN: 1. Continue fetal kick counts twice a day. 2. Continue antepartum testing as scheduled-Biweekly 3. Labor precautions  Fenton Mallingebbie [place], LPN  Herold HarmsMartin A Elenie Coven, MD

## 2016-03-18 ENCOUNTER — Inpatient Hospital Stay: Payer: 59 | Admitting: Anesthesiology

## 2016-03-18 ENCOUNTER — Inpatient Hospital Stay
Admission: EM | Admit: 2016-03-18 | Discharge: 2016-03-20 | DRG: 775 | Disposition: A | Discharge status: Home / Self Care | Payer: 59 | Attending: Obstetrics and Gynecology | Admitting: Obstetrics and Gynecology

## 2016-03-18 DIAGNOSIS — Z3A38 38 weeks gestation of pregnancy: Secondary | ICD-10-CM

## 2016-03-18 DIAGNOSIS — Z8249 Family history of ischemic heart disease and other diseases of the circulatory system: Secondary | ICD-10-CM

## 2016-03-18 DIAGNOSIS — Z3493 Encounter for supervision of normal pregnancy, unspecified, third trimester: Secondary | ICD-10-CM | POA: Diagnosis present

## 2016-03-18 DIAGNOSIS — R825 Elevated urine levels of drugs, medicaments and biological substances: Secondary | ICD-10-CM | POA: Clinically undetermined

## 2016-03-18 DIAGNOSIS — R55 Syncope and collapse: Secondary | ICD-10-CM | POA: Diagnosis not present

## 2016-03-18 DIAGNOSIS — D72829 Elevated white blood cell count, unspecified: Secondary | ICD-10-CM | POA: Diagnosis not present

## 2016-03-18 DIAGNOSIS — O479 False labor, unspecified: Secondary | ICD-10-CM | POA: Diagnosis present

## 2016-03-18 DIAGNOSIS — Z8759 Personal history of other complications of pregnancy, childbirth and the puerperium: Secondary | ICD-10-CM

## 2016-03-18 DIAGNOSIS — Z3483 Encounter for supervision of other normal pregnancy, third trimester: Secondary | ICD-10-CM | POA: Diagnosis not present

## 2016-03-18 DIAGNOSIS — D649 Anemia, unspecified: Secondary | ICD-10-CM | POA: Diagnosis present

## 2016-03-18 LAB — URINE DRUG SCREEN, QUALITATIVE (ARMC ONLY)
AMPHETAMINES, UR SCREEN: NOT DETECTED
BENZODIAZEPINE, UR SCRN: NOT DETECTED
Barbiturates, Ur Screen: NOT DETECTED
Cannabinoid 50 Ng, Ur ~~LOC~~: NOT DETECTED
Cocaine Metabolite,Ur ~~LOC~~: NOT DETECTED
MDMA (Ecstasy)Ur Screen: NOT DETECTED
METHADONE SCREEN, URINE: NOT DETECTED
OPIATE, UR SCREEN: NOT DETECTED
Phencyclidine (PCP) Ur S: NOT DETECTED
Tricyclic, Ur Screen: NOT DETECTED

## 2016-03-18 LAB — CBC
HCT: 38.2 % (ref 35.0–47.0)
HEMOGLOBIN: 13.2 g/dL (ref 12.0–16.0)
MCH: 31.8 pg (ref 26.0–34.0)
MCHC: 34.5 g/dL (ref 32.0–36.0)
MCV: 92.2 fL (ref 80.0–100.0)
Platelets: 229 10*3/uL (ref 150–440)
RBC: 4.14 MIL/uL (ref 3.80–5.20)
RDW: 13.6 % (ref 11.5–14.5)
WBC: 12.8 10*3/uL — ABNORMAL HIGH (ref 3.6–11.0)

## 2016-03-18 LAB — TYPE AND SCREEN
ABO/RH(D): O POS
Antibody Screen: NEGATIVE

## 2016-03-18 LAB — RAPID HIV SCREEN (HIV 1/2 AB+AG)
HIV 1/2 ANTIBODIES: NONREACTIVE
HIV-1 P24 Antigen - HIV24: NONREACTIVE

## 2016-03-18 MED ORDER — PHENYLEPHRINE 40 MCG/ML (10ML) SYRINGE FOR IV PUSH (FOR BLOOD PRESSURE SUPPORT)
PREFILLED_SYRINGE | INTRAVENOUS | Status: AC
  Filled 2016-03-18: qty 10

## 2016-03-18 MED ORDER — OXYCODONE-ACETAMINOPHEN 5-325 MG PO TABS: 2.0000 | ORAL_TABLET | ORAL | Status: DC | PRN

## 2016-03-18 MED ORDER — MISOPROSTOL 200 MCG PO TABS
ORAL_TABLET | ORAL | Status: AC
  Filled 2016-03-18: qty 4

## 2016-03-18 MED ORDER — OXYTOCIN BOLUS FROM INFUSION
500.0000 mL | Freq: Once | INTRAVENOUS | Status: AC
  Administered 2016-03-18: 500 mL via INTRAVENOUS

## 2016-03-18 MED ORDER — LIDOCAINE HCL (PF) 2 % IJ SOLN
INTRAMUSCULAR | Status: DC | PRN
  Administered 2016-03-18: 4 mL via INTRADERMAL

## 2016-03-18 MED ORDER — AMMONIA AROMATIC IN INHA
RESPIRATORY_TRACT | Status: AC
  Filled 2016-03-18: qty 10

## 2016-03-18 MED ORDER — FENTANYL CITRATE (PF) 100 MCG/2ML IJ SOLN: 50.0000 ug | INTRAMUSCULAR | Status: DC | PRN

## 2016-03-18 MED ORDER — BUTORPHANOL TARTRATE 1 MG/ML IJ SOLN: 1.0000 mg | INTRAMUSCULAR | Status: DC | PRN

## 2016-03-18 MED ORDER — BUPIVACAINE HCL (PF) 0.25 % IJ SOLN
INTRAMUSCULAR | Status: DC | PRN
  Administered 2016-03-18 (×2): 4 mL via EPIDURAL

## 2016-03-18 MED ORDER — OXYCODONE-ACETAMINOPHEN 5-325 MG PO TABS: 1.0000 | ORAL_TABLET | ORAL | Status: DC | PRN

## 2016-03-18 MED ORDER — LIDOCAINE HCL (PF) 1 % IJ SOLN
INTRAMUSCULAR | Status: DC | PRN
  Administered 2016-03-18: 3 mL

## 2016-03-18 MED ORDER — EPHEDRINE SULFATE 50 MG/ML IJ SOLN
INTRAMUSCULAR | Status: AC
  Filled 2016-03-18: qty 1

## 2016-03-18 MED ORDER — FENTANYL 2.5 MCG/ML W/ROPIVACAINE 0.2% IN NS 100 ML EPIDURAL INFUSION (ARMC-ANES)
EPIDURAL | Status: AC
  Filled 2016-03-18: qty 100

## 2016-03-18 MED ORDER — PHENYLEPHRINE HCL 10 MG/ML IJ SOLN
INTRAMUSCULAR | Status: DC | PRN
  Administered 2016-03-18: 100 ug via INTRAVENOUS

## 2016-03-18 MED ORDER — LACTATED RINGERS IV SOLN: INTRAVENOUS | Status: DC

## 2016-03-18 MED ORDER — ONDANSETRON HCL 4 MG/2ML IJ SOLN
4.0000 mg | Freq: Four times a day (QID) | INTRAMUSCULAR | Status: DC | PRN
Start: 2016-03-18 — End: 2016-03-19

## 2016-03-18 MED ORDER — OXYTOCIN 10 UNIT/ML IJ SOLN
INTRAMUSCULAR | Status: AC
  Filled 2016-03-18: qty 2

## 2016-03-18 MED ORDER — LIDOCAINE-EPINEPHRINE (PF) 1.5 %-1:200000 IJ SOLN
INTRAMUSCULAR | Status: DC | PRN
  Administered 2016-03-18: 3 mL via EPIDURAL

## 2016-03-18 MED ORDER — FENTANYL 2.5 MCG/ML W/ROPIVACAINE 0.2% IN NS 100 ML EPIDURAL INFUSION (ARMC-ANES)
EPIDURAL | Status: DC | PRN
Start: 2016-03-18 — End: 2016-03-18
  Administered 2016-03-18: 9 mL/h via EPIDURAL

## 2016-03-18 MED ORDER — OXYTOCIN 40 UNITS IN LACTATED RINGERS INFUSION - SIMPLE MED
2.5000 [IU]/h | INTRAVENOUS | Status: DC
  Administered 2016-03-19: 2.5 [IU]/h via INTRAVENOUS
  Filled 2016-03-18: qty 1000

## 2016-03-18 MED ORDER — LIDOCAINE HCL (PF) 1 % IJ SOLN: 30.0000 mL | INTRAMUSCULAR | Status: DC | PRN

## 2016-03-18 MED ORDER — SOD CITRATE-CITRIC ACID 500-334 MG/5ML PO SOLN
30.0000 mL | ORAL | Status: DC | PRN
Start: 2016-03-18 — End: 2016-03-19

## 2016-03-18 MED ORDER — LIDOCAINE HCL (PF) 1 % IJ SOLN
INTRAMUSCULAR | Status: AC
  Filled 2016-03-18: qty 30

## 2016-03-18 MED ORDER — IBUPROFEN 600 MG PO TABS
600.0000 mg | ORAL_TABLET | Freq: Four times a day (QID) | ORAL | Status: DC
  Administered 2016-03-19 – 2016-03-20 (×7): 600 mg via ORAL
  Filled 2016-03-18 (×7): qty 1

## 2016-03-18 MED ORDER — SODIUM CHLORIDE 0.9 % IJ SOLN
INTRAMUSCULAR | Status: AC
Start: 2016-03-18 — End: 2016-03-18
  Filled 2016-03-18: qty 10

## 2016-03-18 MED ORDER — ACETAMINOPHEN 325 MG PO TABS: 650.0000 mg | ORAL_TABLET | ORAL | Status: DC | PRN

## 2016-03-18 MED ORDER — FENTANYL CITRATE (PF) 100 MCG/2ML IJ SOLN
50.0000 ug | INTRAMUSCULAR | Status: DC | PRN
  Administered 2016-03-18: 100 ug via INTRAMUSCULAR
  Filled 2016-03-18: qty 2

## 2016-03-18 MED ORDER — LACTATED RINGERS IV SOLN
500.0000 mL | INTRAVENOUS | Status: DC | PRN
  Administered 2016-03-18: 250 mL via INTRAVENOUS
  Administered 2016-03-18: 500 mL via INTRAVENOUS

## 2016-03-18 MED ORDER — OXYCODONE-ACETAMINOPHEN 5-325 MG PO TABS
1.0000 | ORAL_TABLET | ORAL | Status: DC | PRN
  Administered 2016-03-19 – 2016-03-20 (×3): 1 via ORAL
  Filled 2016-03-18 (×3): qty 1

## 2016-03-18 NOTE — Anesthesia Procedure Notes (Signed)
Epidural Patient location during procedure: OB Start time: 03/18/2016 10:18 AM End time: 03/18/2016 10:40 AM  Staffing Anesthesiologist: Yevette EdwardsADAMS, JAMES G Resident/CRNA: Malva CoganBEANE, Charlis Harner Performed: resident/CRNA   Preanesthetic Checklist Completed: patient identified, site marked, surgical consent, pre-op evaluation, timeout performed, IV checked, risks and benefits discussed and monitors and equipment checked  Epidural Patient position: sitting Prep: Betadine Patient monitoring: heart rate, continuous pulse ox and blood pressure Approach: midline Location: L3-L4 Injection technique: LOR saline  Needle:  Needle type: Tuohy  Needle gauge: 17 G Needle length: 9 cm and 9 Needle insertion depth: 6 cm Catheter type: closed end flexible Catheter size: 19 Gauge Catheter at skin depth: 12 cm Test dose: negative and 1.5% lidocaine with Epi 1:200 K  Assessment Sensory level: T10 Events: blood not aspirated, injection not painful, no injection resistance, negative IV test and no paresthesia  Additional Notes Pt. Evaluated and documentation done after procedure finished. Patient identified. Risks/Benefits/Options discussed with patient including but not limited to bleeding, infection, nerve damage, paralysis, failed block, incomplete pain control, headache, blood pressure changes, nausea, vomiting, reactions to medication both or allergic, itching and postpartum back pain. Confirmed with bedside nurse the patient's most recent platelet count. Confirmed with patient that they are not currently taking any anticoagulation, have any bleeding history or any family history of bleeding disorders. Patient expressed understanding and wished to proceed. All questions were answered. Sterile technique was used throughout the entire procedure. Please see nursing notes for vital signs. Test dose was given through epidural catheter and negative prior to continuing to dose epidural or start infusion. Warning  signs of high block given to the patient including shortness of breath, tingling/numbness in hands, complete motor block, or any concerning symptoms with instructions to call for help. Patient was given instructions on fall risk and not to get out of bed. All questions and concerns addressed with instructions to call with any issues or inadequate analgesia.   Patient tolerated the insertion well without immediate complications.Reason for block:procedure for pain

## 2016-03-18 NOTE — Progress Notes (Signed)
Chaplain rounded the unit to process Advance Directive order.As Chaplain knocked on door and female visitor walked pass Chaplain entering the room female voice responded from behind the curatin. "she fell asleep". Chaplain will return later. Jefm PettyChaplain Carman Auxier [phone]

## 2016-03-18 NOTE — Progress Notes (Signed)
Intrapartum Progress Note  S: Patient doing well, no complaints. Epidural working well.   O: Blood pressure 125/76, pulse 96, temperature 97.5 F (36.4 C), temperature source Oral, resp. rate 17, height 5\' 4"  (1.626 m), weight 179 lb (81.2 kg), last menstrual period 05/30/2015, SpO2 99 %.    Gen App: NAD, comfortable Abdomen: soft, gravid FHT: baseline 140 bpm.  Accels present.  Decels absent, moderate in degree variability.   Tocometer: contractions q 4-5 minutes Cervix: 7.5/100/-2/c Extremities: Nontender, no edema.  Labs: No new labs   Assessment:  1: SIUP at 6326w6d 2. Active labor 3. H/o prior stillborn in 3rd trimester   Plan:  1.  AROM'd with moderate meconium stained fluid.  Will notify Neonatology.  2. Continue to anticipate vaginal delivery.    Hildred LaserAnika Tineshia Becraft, MD 03/18/2016 3:09 PM

## 2016-03-18 NOTE — Progress Notes (Addendum)
11[facility], after repositioning patient, pt. complaining of SOB, epidural level checked (T8), pulse ox sensor put on left hand (3rd finger) at 1120 (98% on RA); color appropriate for ethnicity.  O2 at 10L started, pt. States, "this makes me feel a little better". Anesthesiologist  (Dr. Pernell DupreAdams) [facility].  At 11:28 fetal heart rate dropped to 70s, [facility], level at T6, Dr. Valentino Saxonherry [facility]aged [facility], Epidural infusion stopped, maternal BP 79/30, 97/49. 15 mg ephedrine pushed by Dr. Pernell DupreAdams, BP increased to 96/49.  During 7 minute prolonged fetal decel, pt. Repositioned from left lateral to right lateral, 02 remains in place via face mask, fluid bolus initiated, (500 ml LR). Fetal heart rate return to baseline of 140s bpm. Contractions are 3-5 minutes apart, 40-80 secs.

## 2016-03-18 NOTE — Progress Notes (Addendum)
Intrapartum Progress Note  S: Called by nurse who notes that patient had syncopal episode shortly after epidural placement.  Also noted prolonged deceleration.   O: Blood pressure (!) 79/30, pulse (!) 180, temperature 98 F (36.7 C), temperature source Oral, resp. rate 18, height 5\' 4"  (1.626 m), weight 179 lb (81.2 kg), last menstrual period 05/30/2015, SpO2 96 %.    Gen App: NAD, comfortable Abdomen: soft, gravid FHT: baseline 175 bpm.  Accels present.  Decels present ( 7 min decel beginning at 11:28, from baseline 150s down to 70s with slow return to baseline). moderate in degree variability.   Tocometer: contractions q 3-5 minutes Cervix: 6.5/80/-2/c Extremities: Nontender, no edema.  Labs: No new labs    Assessment:  1: SIUP at 3253w6d 2. Active labor 3. H/o prior stillborn in 3rd trimester 4. Fetal tachycardia  Plan:  1. BPs reviewed, in 70s/30s shortly after epidural with syncopal episode, now upon my arrival, 100s/60s.  Patient received 15 mg of ephedrine prior to my arrival.  Patient is responsive.  Appears alert and oriented.  Currently denies pain, only pressure in pelvis.  2.  Will continue expectant management of labor at this time. At next check, will AROM if fetal status is reassuring.  3. Fetal tachycardia likely secondary to recent ephedrine dose.  Will continue to monitor.    Hildred LaserAnika Kenosha Doster, MD 03/18/2016 12:07 PM

## 2016-03-18 NOTE — Progress Notes (Signed)
Chaplain returned to assist with completing Advance Directive. Patient did not want to complete at present time.Jefm PettyChaplain Jayquan Bradsher [phone]

## 2016-03-18 NOTE — Anesthesia Preprocedure Evaluation (Signed)
Anesthesia Evaluation  Patient identified by MRN, date of birth, ID band Patient awake    Reviewed: Allergy & Precautions, H&P , NPO status , Patient's Chart, lab work & pertinent test results, reviewed documented beta blocker date and time   Airway Mallampati: II  TM Distance: >3 FB Neck ROM: full    Dental no notable dental hx. (+) Teeth Intact   Pulmonary neg pulmonary ROS, Current Smoker,    Pulmonary exam normal breath sounds clear to auscultation       Cardiovascular Exercise Tolerance: Good negative cardio ROS   Rhythm:regular Rate:Normal     Neuro/Psych negative neurological ROS  negative psych ROS   GI/Hepatic negative GI ROS, Neg liver ROS,   Endo/Other  negative endocrine ROSdiabetes  Renal/GU      Musculoskeletal   Abdominal   Peds  Hematology negative hematology ROS (+) anemia ,   Anesthesia Other Findings   Reproductive/Obstetrics (+) Pregnancy                             Anesthesia Physical Anesthesia Plan  ASA: II  Anesthesia Plan: Epidural   Post-op Pain Management:    Induction:   Airway Management Planned:   Additional Equipment:   Intra-op Plan:   Post-operative Plan:   Informed Consent: I have reviewed the patients History and Physical, chart, labs and discussed the procedure including the risks, benefits and alternatives for the proposed anesthesia with the patient or authorized representative who has indicated his/her understanding and acceptance.     Plan Discussed with:   Anesthesia Plan Comments:         Anesthesia Quick Evaluation

## 2016-03-18 NOTE — OB Triage Note (Signed)
Patient complaining of painful contractions every 4 minutes since 0300. Patient states the contractions woke her up out of her sleep.

## 2016-03-18 NOTE — Progress Notes (Signed)
Intrapartum Progress Note  S: Patient doing well, feeling pelvic pressure.   O: Blood pressure 121/78, pulse (!) 104, temperature 98.3 F (36.8 C), temperature source Oral, resp. rate 18, height 5\' 4"  (1.626 m), weight 179 lb (81.2 kg), last menstrual period 05/30/2015, SpO2 99 %.    Gen App: NAD, comfortable Abdomen: soft, gravid FHT: baseline 135 bpm.  Accels present.  Decels absent, moderate in degree variability.   Tocometer: contractions q 2-313minutes Cervix: 9/100/0/c Extremities: Nontender, no edema.  Labs: No new labs   Assessment:  1: SIUP at 717w6d 2. Active labor 3. H/o prior stillborn in 3rd trimester   Plan:  1. Continue expectant management.  2. Anticipate vaginal delivery.    Hildred LaserAnika Marlina Cataldi, MD 03/18/2016 7:12 PM

## 2016-03-18 NOTE — H&P (Signed)
Obstetric History and Physical  Brittany Page is a 25 y.o. G2P0100 with IUP at 4978w6d presenting for contractions since 0300 this morning. Patient states she has been having  regular, every 2-3 minutes contractions, none vaginal bleeding, intact membranes, with active fetal movement.    Prenatal Course Source of Care: [facility] with onset of care at 6 weeks Pregnancy complications or risks: Patient Active Problem List   Diagnosis Date Noted  . Uterine contractions during pregnancy 03/18/2016  . Excessive weight gain affecting pregnancy 02/20/2016  . Supervision of high risk pregnancy in third trimester 01/22/2016  . History of unexplained stillbirth 08/01/2015  . Anemia 01/15/2015  . Miscarriage within last 12 months 01/15/2015   She plans to breastfeed She desires undecided method for postpartum contraception.   Prenatal labs and studies: ABO, Rh: O/Positive/-- (05/10 1424) Antibody: Negative (05/10 1424) Rubella: 2.05 (10/17 1200) RPR: Non Reactive (05/10 1424)  HBsAg: Negative (05/10 1424)  HIV: Non Reactive (05/10 1424)  GBS:  1 hr Glucola  Normal (106) Genetic screening normal Anatomy US normal   Past Medical History:  Diagnosis Date  . Anemia 01/15/2015  . Low hemoglobin   . Miscarriage 04/2014   lost at 8 month gestation (still born)    Past Surgical History:  Procedure Laterality Date  . WISDOM TOOTH EXTRACTION     tooth in nasal cavity    OB History  Gravida Para Term Preterm AB Living  2 1 0 1 0 0  SAB TAB Ectopic Multiple Live Births  0 0 0 0      # Outcome Date GA Lbr Len/2nd Weight Sex Delivery Anes PTL Lv  2 Current           1 Preterm 05/03/14 5854w0d   M Vag-Spont EPI N FD    Obstetric Comments  05/03/2014 stillbirth (cord twisted), unsure this was why.    Social History   Social History  . Marital status: Single    Spouse name: N/A  . Number of children: N/A  . Years of education: N/A   Social History Main Topics  .  Smoking status: Never Smoker  . Smokeless tobacco: Never Used  . Alcohol use No  . Drug use: No  . Sexual activity: Yes    Partners: Male    Birth control/ protection: None     Comment: Pregnant    Other Topics Concern  . None   Social History Narrative  . None    Family History  Problem Relation Age of Onset  . Hypertension Father   . Migraines Father   . Sleep apnea Father     sinus problems  . Cancer Maternal Grandmother     cervical cancer  . Hepatitis B Maternal Grandfather   . Pancreatic cancer Maternal Grandfather   . Heart attack Paternal Grandfather     Prescriptions Prior to Admission  Medication Sig Dispense Refill Last Dose  . Prenatal Vit-Fe Fumarate-FA (PNV PRENATAL PLUS MULTIVITAMIN) 27-1 MG TABS Take 1 tablet by mouth daily.  12 03/17/2016 at Unknown time  . Acetaminophen 325 MG CAPS Take 650 mg by mouth every 6 (six) hours as needed (for pain/fever). Reported on 10/11/2015   Not Taking at Unknown time    No Known Allergies  Review of Systems: Negative except for what is mentioned in HPI.  Physical Exam: BP 134/77 (BP Location: Left Arm)   Pulse 91   Temp 98.4 F (36.9 C) (Oral)   Resp 18   Ht  5\' 4"  (1.626 m)   Wt 179 lb (81.2 kg)   LMP 05/30/2015 (Within Weeks)   BMI 30.73 kg/m  CONSTITUTIONAL: Well-developed, well-nourished female in no acute distress.  HENT:  Normocephalic, atraumatic, External right and left ear normal. Oropharynx is clear and moist EYES: Conjunctivae and EOM are normal. Pupils are equal, round, and reactive to light. No scleral icterus.  NECK: Normal range of motion, supple, no masses SKIN: Skin is warm and dry. No rash noted. Not diaphoretic. No erythema. No pallor. NEUROLOGIC: Alert and oriented to person, place, and time. Normal reflexes, muscle tone coordination. No cranial nerve deficit noted. PSYCHIATRIC: Normal mood and affect. Normal behavior. Normal judgment and thought content. CARDIOVASCULAR: Normal heart rate  noted, regular rhythm RESPIRATORY: Effort and breath sounds normal, no problems with respiration noted ABDOMEN: Soft, nontender, nondistended, gravid. MUSCULOSKELETAL: Normal range of motion. No edema and no tenderness. 2+ distal pulses.  Cervical Exam: Dilatation 5.5 cm   Effacement 70%   Station -2   Presentation: cephalic FHT:  Baseline rate 140 bpm   Variability moderate  Accelerations present   Decelerations variable (occasional) Contractions: Every 3-5 mins   Pertinent Labs/Studies:   Results for orders placed or performed during the hospital encounter of 03/18/16 (from the past 24 hour(s))  CBC     Status: Abnormal   Collection Time: 03/18/16  8:54 AM  Result Value Ref Range   WBC 12.8 (H) 3.6 - 11.0 K/uL   RBC 4.14 3.80 - 5.20 MIL/uL   Hemoglobin 13.2 12.0 - 16.0 g/dL   HCT 16.1 09.6 - 04.5 %   MCV 92.2 80.0 - 100.0 fL   MCH 31.8 26.0 - 34.0 pg   MCHC 34.5 32.0 - 36.0 g/dL   RDW 40.9 81.1 - 91.4 %   Platelets 229 150 - 440 K/uL  Type and screen  [facility]     Status: None   Collection Time: 03/18/16  8:54 AM  Result Value Ref Range   ABO/RH(D) O POS    Antibody Screen NEG    Sample Expiration 03/21/2016   Rapid HIV screen (HIV 1/2 Ab+Ag) ([facility] Only)     Status: None   Collection Time: 03/18/16  8:54 AM  Result Value Ref Range   HIV-1 P24 Antigen - HIV24 NON REACTIVE NON REACTIVE   HIV 1/2 Antibodies NON REACTIVE NON REACTIVE   Interpretation (HIV Ag Ab)      A non reactive test result means that HIV 1 or HIV 2 antibodies and HIV 1 p24 antigen were not detected in the specimen.  Urine Drug Screen, Qualitative ([facility] only)     Status: None   Collection Time: 03/18/16  8:59 AM  Result Value Ref Range   Tricyclic, Ur Screen NONE DETECTED NONE DETECTED   Amphetamines, Ur Screen NONE DETECTED NONE DETECTED   MDMA (Ecstasy)Ur Screen NONE DETECTED NONE DETECTED   Cocaine Metabolite,Ur [place] NONE DETECTED NONE DETECTED   Opiate, Ur Screen NONE DETECTED  NONE DETECTED   Phencyclidine (PCP) Ur S NONE DETECTED NONE DETECTED   Cannabinoid 50 Ng, Ur [place] NONE DETECTED NONE DETECTED   Barbiturates, Ur Screen NONE DETECTED NONE DETECTED   Benzodiazepine, Ur Scrn NONE DETECTED NONE DETECTED   Methadone Scn, Ur NONE DETECTED NONE DETECTED    Assessment : Brittany Page is a 25 y.o. G2P0100 at [redacted]w[redacted]d being admitted for labor.  H/o prior stillborn at ~ [redacted] weeks gestation.   Plan: Labor: Expectant management.  Augmentation as needed, per protocol.  FWB: Overall reassuring fetal heart tracing. Occasional shallow variable noted. GBS negative Delivery plan: Hopeful for vaginal delivery.  May have epidural if desired.    Hildred LaserAnika Keisha Amer, MD [facility]

## 2016-03-19 ENCOUNTER — Encounter: Payer: 59 | Admitting: Obstetrics and Gynecology

## 2016-03-19 LAB — CBC
HCT: 33.3 % — ABNORMAL LOW (ref 35.0–47.0)
Hemoglobin: 11.3 g/dL — ABNORMAL LOW (ref 12.0–16.0)
MCH: 31.8 pg (ref 26.0–34.0)
MCHC: 33.8 g/dL (ref 32.0–36.0)
MCV: 94.1 fL (ref 80.0–100.0)
PLATELETS: 193 10*3/uL (ref 150–440)
RBC: 3.54 MIL/uL — ABNORMAL LOW (ref 3.80–5.20)
RDW: 13.5 % (ref 11.5–14.5)
WBC: 19 10*3/uL — ABNORMAL HIGH (ref 3.6–11.0)

## 2016-03-19 LAB — RPR: RPR Ser Ql: NONREACTIVE

## 2016-03-19 MED ORDER — OXYCODONE-ACETAMINOPHEN 5-325 MG PO TABS: 2.0000 | ORAL_TABLET | ORAL | Status: DC | PRN

## 2016-03-19 MED ORDER — DIPHENHYDRAMINE HCL 25 MG PO CAPS: 25.0000 mg | ORAL_CAPSULE | Freq: Four times a day (QID) | ORAL | Status: DC | PRN

## 2016-03-19 MED ORDER — WITCH HAZEL-GLYCERIN EX PADS: 1.0000 "application " | MEDICATED_PAD | CUTANEOUS | Status: DC | PRN

## 2016-03-19 MED ORDER — DIBUCAINE 1 % RE OINT: 1.0000 "application " | TOPICAL_OINTMENT | RECTAL | Status: DC | PRN

## 2016-03-19 MED ORDER — SENNOSIDES-DOCUSATE SODIUM 8.6-50 MG PO TABS: 2.0000 | ORAL_TABLET | ORAL | Status: DC

## 2016-03-19 MED ORDER — SIMETHICONE 80 MG PO CHEW: 80.0000 mg | CHEWABLE_TABLET | ORAL | Status: DC | PRN

## 2016-03-19 MED ORDER — ONDANSETRON HCL 4 MG PO TABS: 4.0000 mg | ORAL_TABLET | ORAL | Status: DC | PRN

## 2016-03-19 MED ORDER — ONDANSETRON HCL 4 MG/2ML IJ SOLN: 4.0000 mg | INTRAMUSCULAR | Status: DC | PRN

## 2016-03-19 MED ORDER — SENNOSIDES-DOCUSATE SODIUM 8.6-50 MG PO TABS
2.0000 | ORAL_TABLET | ORAL | Status: DC
  Administered 2016-03-19 – 2016-03-20 (×2): 2 via ORAL
  Filled 2016-03-19 (×2): qty 2

## 2016-03-19 MED ORDER — ACETAMINOPHEN 325 MG PO TABS: 650.0000 mg | ORAL_TABLET | ORAL | Status: DC | PRN

## 2016-03-19 MED ORDER — PRENATAL MULTIVITAMIN CH
1.0000 | ORAL_TABLET | Freq: Every day | ORAL | Status: DC
  Administered 2016-03-19 – 2016-03-20 (×2): 1 via ORAL
  Filled 2016-03-19 (×2): qty 1

## 2016-03-19 MED ORDER — ZOLPIDEM TARTRATE 5 MG PO TABS: 5.0000 mg | ORAL_TABLET | Freq: Every evening | ORAL | Status: DC | PRN

## 2016-03-19 MED ORDER — COCONUT OIL OIL: 1.0000 "application " | TOPICAL_OIL | Status: DC | PRN

## 2016-03-19 MED ORDER — BENZOCAINE-MENTHOL 20-0.5 % EX AERO
1.0000 "application " | INHALATION_SPRAY | CUTANEOUS | Status: DC | PRN
  Administered 2016-03-19: 1 via TOPICAL
  Filled 2016-03-19: qty 56
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
## 2016-03-19 NOTE — Anesthesia Postprocedure Evaluation (Signed)
Anesthesia Post Note  Patient: Brittany Page  Procedure(s) Performed: * No procedures listed *  Patient location during evaluation: Mother Baby Anesthesia Type: Epidural Level of consciousness: awake and alert and oriented Pain management: satisfactory to patient Vital Signs Assessment: post-procedure vital signs reviewed and stable Respiratory status: respiratory function stable Cardiovascular status: blood pressure returned to baseline Postop Assessment: no backache, no headache, epidural receding, no signs of nausea or vomiting, patient able to bend at knees and adequate PO intake Anesthetic complications: no     Last Vitals:  Vitals:   03/19/16 0123 03/19/16 0351  BP: 116/61 130/71  Pulse: (!) 105 91  Resp: 18 20  Temp: 36.8 C 36.5 C    Last Pain:  Vitals:   03/19/16 0351  TempSrc: Oral  PainSc:                  Clydene PughBeane, [name]
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
## 2016-03-19 NOTE — Progress Notes (Signed)
Patient ambulatory to bathroom without difficulty, voided large amount. Pericare explained. Patient denies questions a this time. Patient [facility] Report given to Arna Mediciora, RN at bedside, no questions at this time. Alinda MoneyM Adrion Menz, RN

## 2016-03-20 LAB — CBC
HCT: 31.5 % — ABNORMAL LOW (ref 35.0–47.0)
Hemoglobin: 10.6 g/dL — ABNORMAL LOW (ref 12.0–16.0)
MCH: 31.5 pg (ref 26.0–34.0)
MCHC: 33.7 g/dL (ref 32.0–36.0)
MCV: 93.6 fL (ref 80.0–100.0)
PLATELETS: 187 10*3/uL (ref 150–440)
RBC: 3.36 MIL/uL — AB (ref 3.80–5.20)
RDW: 13.9 % (ref 11.5–14.5)
WBC: 12.6 10*3/uL — ABNORMAL HIGH (ref 3.6–11.0)

## 2016-03-20 LAB — RPR: RPR Ser Ql: NONREACTIVE

## 2016-03-20 MED ORDER — HYDROCODONE-ACETAMINOPHEN 5-325 MG PO TABS: 1.0000 | ORAL_TABLET | Freq: Four times a day (QID) | ORAL | 0 refills | Status: DC | PRN

## 2016-03-20 MED ORDER — DOCUSATE SODIUM 100 MG PO CAPS: 100.0000 mg | ORAL_CAPSULE | Freq: Two times a day (BID) | ORAL | 2 refills | Status: DC | PRN

## 2016-03-20 MED ORDER — IBUPROFEN 800 MG PO TABS: 800.0000 mg | ORAL_TABLET | Freq: Three times a day (TID) | ORAL | 1 refills | Status: DC | PRN

## 2016-03-20 NOTE — Discharge Instructions (Signed)

## 2016-03-20 NOTE — Progress Notes (Signed)
Patient verbalizes understanding of all discharge instructions and the need to make follow up appointments. Provided education and walked through the proper set-up of car seat. Patient discharge via wheelchair with auxillary.
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
## 2016-03-20 NOTE — Discharge Summary (Signed)
Obstetric Discharge Summary Reason for Admission: onset of labor Prenatal Procedures: NST and ultrasound Intrapartum Procedures: spontaneous vaginal delivery Postpartum Procedures: none Complications-Operative and Postpartum: vaginal laceration Hemoglobin  Date Value Ref Range Status  03/19/2016 11.3 (L) 12.0 - 16.0 g/dL Final   HGB  Date Value Ref Range Status  11/05/2013 12.4 12.0 - 16.0 g/dL Final   HCT  Date Value Ref Range Status  03/19/2016 33.3 (L) 35.0 - 47.0 % Final   Hematocrit  Date Value Ref Range Status  01/08/2016 32.5 (L) 34.0 - 46.6 % Final    Physical Exam:  General: alert and no distress Lochia: appropriate Uterine Fundus: firm Incision: None DVT Evaluation: No evidence of DVT seen on physical exam. Negative Homan's sign. No cords or calf tenderness. No significant calf/ankle edema.  Discharge Diagnoses: Term Pregnancy-delivered  Discharge Information: Date: 03/20/2016 Activity: pelvic rest Diet: routine Medications: PNV, Ibuprofen, Colace and Vicodin Condition: stable Instructions: refer to practice specific booklet Discharge to: home Follow-up Information    Hildred LaserAnika Seena Ritacco, MD Follow up in 6 week(s).   Specialties:  Obstetrics and Gynecology, Radiology Why:  Postpartum visit Contact information: [address] [place] [id number] [phone]           Newborn Data: Live born female  Birth Weight: 7 lb 10.8 oz (3480 g) APGAR: 7, 9  Home with mother.  Hildred Lasernika Charmion Hapke 03/20/2016, 7:35 AM

## 2016-04-02 ENCOUNTER — Telehealth: Payer: Self-pay | Admitting: Obstetrics and Gynecology

## 2016-04-02 DIAGNOSIS — Z30013 Encounter for initial prescription of injectable contraceptive: Secondary | ICD-10-CM

## 2016-04-02 MED ORDER — MEDROXYPROGESTERONE ACETATE 150 MG/ML IM SUSP: 150.0000 mg | INTRAMUSCULAR | 3 refills | Status: DC

## 2016-04-02 NOTE — Telephone Encounter (Signed)
Patient called stating she wants to start on the depo for birth control. She has an appointment the second week in February for her 6 week post partum. She wanted to know if she could get the depo that same day?

## 2016-04-02 NOTE — Telephone Encounter (Signed)
Called pt informed her that she can have depo at her postpartum visit, sent in RX.

## 2016-04-17 NOTE — Progress Notes (Signed)
This encounter was created in error - please disregard.

## 2016-04-22 ENCOUNTER — Ambulatory Visit (INDEPENDENT_AMBULATORY_CARE_PROVIDER_SITE_OTHER): Payer: 59 | Admitting: Obstetrics and Gynecology

## 2016-04-22 ENCOUNTER — Encounter: Payer: Self-pay | Admitting: Obstetrics and Gynecology

## 2016-04-22 DIAGNOSIS — O99345 Other mental disorders complicating the puerperium: Secondary | ICD-10-CM

## 2016-04-22 DIAGNOSIS — Z30013 Encounter for initial prescription of injectable contraceptive: Secondary | ICD-10-CM | POA: Diagnosis not present

## 2016-04-22 DIAGNOSIS — F53 Postpartum depression: Secondary | ICD-10-CM

## 2016-04-22 DIAGNOSIS — O9081 Anemia of the puerperium: Secondary | ICD-10-CM

## 2016-04-22 MED ORDER — MEDROXYPROGESTERONE ACETATE 150 MG/ML IM SUSP
150.0000 mg | Freq: Once | INTRAMUSCULAR | Status: AC
  Administered 2016-04-22: 150 mg via INTRAMUSCULAR

## 2016-04-22 NOTE — Patient Instructions (Signed)
PREVENTION OF RECURRENT VAGINITIS:   1. First and foremost, eat a healthy diet rich in fruits and vegetables and low in sugar and refined carbohydrates. Studies have shown that yeast and harmful bacteria are more likely to be a problem in people with high sugar diets. One food believed to be particularly helpful is garlic. 2. Support your immune system: reduce stress, get adequate sleep and exercise regularly. 3. Eat plain, probiotic yogurt daily or take a daily vaginal probiotic (can be found in the feminine aisle at any pharmacy) containing Acidophillus (they should contain at least 1 billion CFU's- this is information found on the bottle).  4. Avoid harsh soaps, and detergents.  Can use vaginal washes like Vagisil or Summer's Eve.  5.Avoid routine douching, and if you notice that sex can trigger infections for you, use condoms. For some women who get infections easily, both douching and the alkaline nature of semen can disrupt the vaginal pH.  

## 2016-04-22 NOTE — Progress Notes (Signed)
   OBSTETRICS POSTPARTUM CLINIC PROGRESS NOTE  Subjective:     Brittany Page is a 26 y.o. 802P1101 female who presents for a postpartum visit. She is 6 weeks postpartum following a spontaneous vaginal delivery. I have fully reviewed the prenatal and intrapartum course. The delivery was at 38 gestational weeks.  Anesthesia: epidural. Postpartum course has been well. Baby's course has been well. Baby is feeding by bottle - Similac Advance. Bleeding: patient has not resumed menses, with No LMP recorded.. Bowel function is normal. Bladder function is normal. Patient is not sexually active. Contraception method desired is Depo-Provera injections. Postpartum depression screening: positive. PHQ-9 score 9  The following portions of the patient's history were reviewed and updated as appropriate: allergies, current medications, past family history, past medical history, past social history, past surgical history and problem list.  Review of Systems Pertinent items noted in HPI and remainder of comprehensive ROS otherwise negative.   Objective:    BP 127/86 (BP Location: Left Arm, Patient Position: Sitting, Cuff Size: Normal)   Pulse (!) 101   Ht 5\' 4"  (1.626 m)   Wt 152 lb 1.6 oz (69 kg)   Breastfeeding? No   BMI 26.11 kg/m   General:  alert and no distress   Breasts:  inspection negative, no nipple discharge or bleeding, no masses or nodularity palpable  Lungs: clear to auscultation bilaterally  Heart:  regular rate and rhythm, S1, S2 normal, no murmur, click, rub or gallop  Abdomen: soft, non-tender; bowel sounds normal; no masses,  no organomegaly.     Vulva:  normal  Vagina: normal vagina, no discharge, exudate, lesion, or erythema  Cervix:  no cervical motion tenderness and no lesions  Corpus: normal size, contour, position, consistency, mobility, non-tender  Adnexa:  normal adnexa and no mass, fullness, tenderness  Rectal Exam: Not performed.         Labs:  Lab Results  Component Value  Date   HGB 10.6 (L) 03/20/2016    Assessment:   Routine postpartum exam.   Postpartum depression Contraception management Postpartum anemia, mild  Plan:   1. Contraception: Depo-Provera injections.  First injection today.  2. Postpartum depression, mild, will refer for counseling 3. Can resume work and all other activities. Work note given.  4. Mild postpartum anemia, asymptomatic.  Continue PNV which contain iron, advised on iron-rich foods. 5. Follow up in: 3 months for next Depo Provera injection.  Will f/u with PCP for annual exams.     Hildred LaserAnika Craig Ionescu, MD [facility]

## 2016-04-23 ENCOUNTER — Telehealth: Payer: Self-pay | Admitting: Obstetrics and Gynecology

## 2016-04-23 NOTE — Telephone Encounter (Signed)
Mica called and said Dr. Valentino Saxonherry said she could go back to work immediately but it should have said the date on her FMLA. Anyway she said it needs to be straightened out for her work. I gave her a note that said she could go back 2/5 I think w/o restrictions

## 2016-04-30 ENCOUNTER — Encounter: Payer: 59 | Admitting: Obstetrics and Gynecology

## 2016-05-05 ENCOUNTER — Ambulatory Visit (INDEPENDENT_AMBULATORY_CARE_PROVIDER_SITE_OTHER): Payer: 59 | Admitting: Family Medicine

## 2016-05-05 ENCOUNTER — Encounter: Payer: Self-pay | Admitting: Family Medicine

## 2016-05-05 VITALS — BP 130/89 | HR 92 | Temp 98.8°F | Resp 17 | Ht 64.0 in | Wt 160.0 lb

## 2016-05-05 DIAGNOSIS — R300 Dysuria: Secondary | ICD-10-CM | POA: Diagnosis not present

## 2016-05-05 DIAGNOSIS — N3001 Acute cystitis with hematuria: Secondary | ICD-10-CM | POA: Diagnosis not present

## 2016-05-05 MED ORDER — CIPROFLOXACIN HCL 250 MG PO TABS: 250.0000 mg | ORAL_TABLET | Freq: Two times a day (BID) | ORAL | 0 refills | Status: DC

## 2016-05-05 NOTE — Progress Notes (Signed)
BP 130/89 (BP Location: Left Arm, Patient Position: Sitting, Cuff Size: Normal)   Pulse 92   Temp 98.8 F (37.1 C) (Oral)   Resp 17   Ht 5\' 4"  (1.626 m)   Wt 160 lb (72.6 kg)   LMP 05/05/2016   SpO2 100%   BMI 27.46 kg/m    Subjective:    Patient ID: Brittany Page, female    DOB: 08/12/1990, 26 y.o.   MRN: [id number]  HPI: Brittany Page is a 26 y.o. female  Chief Complaint  Patient presents with  . Dysuria    Onset today   URINARY SYMPTOMS- recently was pregnant Duration: today Dysuria: yes- hurting after she pees Urinary frequency: yes Urgency: yes Small volume voids: yes Symptom severity: moderate Urinary incontinence: no Foul odor: no Hematuria: yes Abdominal pain: yes- for 2 days a couple of days ago Back pain: no Suprapubic pain/pressure: no Flank pain: no Fever:  no Vomiting: no Relief with cranberry juice: no Relief with pyridium: no Status: better/worse/stable Previous urinary tract infection: yes Recurrent urinary tract infection: no Sexual activity: monogomous History of sexually transmitted disease: no Vaginal discharge: no Treatments attempted: increasing fluids   Relevant past medical, surgical, family and social history reviewed and updated as indicated. Interim medical history since our last visit reviewed. Allergies and medications reviewed and updated.  Review of Systems  Constitutional: Negative.   Respiratory: Negative.   Cardiovascular: Negative.   Gastrointestinal: Negative.   Genitourinary: Positive for frequency and urgency. Negative for decreased urine volume, difficulty urinating, dyspareunia, dysuria, enuresis, flank pain, genital sores, hematuria, menstrual problem, pelvic pain, vaginal bleeding, vaginal discharge and vaginal pain.  Psychiatric/Behavioral: Negative.     Per HPI unless specifically indicated above     Objective:    BP 130/89 (BP Location: Left Arm, Patient Position: Sitting, Cuff Size: Normal)   Pulse 92    Temp 98.8 F (37.1 C) (Oral)   Resp 17   Ht 5\' 4"  (1.626 m)   Wt 160 lb (72.6 kg)   LMP 05/05/2016   SpO2 100%   BMI 27.46 kg/m   Wt Readings from Last 3 Encounters:  05/05/16 160 lb (72.6 kg)  04/22/16 152 lb 1.6 oz (69 kg)  03/12/16 179 lb 12.8 oz (81.6 kg)    Physical Exam  Constitutional: She is oriented to person, place, and time. She appears well-developed and well-nourished. No distress.  HENT:  Head: Normocephalic and atraumatic.  Right Ear: Hearing normal.  Left Ear: Hearing normal.  Nose: Nose normal.  Eyes: Conjunctivae and lids are normal. Right eye exhibits no discharge. Left eye exhibits no discharge. No scleral icterus.  Cardiovascular: Normal rate, regular rhythm, normal heart sounds and intact distal pulses.  Exam reveals no gallop and no friction rub.   No murmur heard. Pulmonary/Chest: Effort normal and breath sounds normal. No respiratory distress. She has no wheezes. She has no rales. She exhibits no tenderness.  Musculoskeletal: Normal range of motion.  Neurological: She is alert and oriented to person, place, and time.  Skin: Skin is warm, dry and intact. No rash noted. She is not diaphoretic. No erythema. No pallor.  Psychiatric: She has a normal mood and affect. Her speech is normal and behavior is normal. Judgment and thought content normal. Cognition and memory are normal.  Nursing note and vitals reviewed.   Results for orders placed or performed in visit on 03/12/16  POCT urinalysis dipstick  Result Value Ref Range   Color, UA yellow  Clarity, UA clear    Glucose, UA negative    Bilirubin, UA negative    Ketones, UA negative    Spec Grav, UA 1.015    Blood, UA negative    pH, UA 6.0    Protein, UA negative    Urobilinogen, UA negative    Nitrite, UA negative    Leukocytes, UA Negative Negative      Assessment & Plan:   Problem List Items Addressed This Visit    None    Visit Diagnoses    Acute cystitis with hematuria    -   Primary   +UA- will treat with cipro. Call with any concerns.    Dysuria       +UA   Relevant Orders   UA/M w/rflx Culture, Routine       Follow up plan: Return if symptoms worsen or fail to improve.

## 2016-05-13 ENCOUNTER — Telehealth: Payer: Self-pay | Admitting: Family Medicine

## 2016-05-13 LAB — MICROSCOPIC EXAMINATION: WBC, UA: >30 /hpf — ABNORMAL HIGH (ref 0–?)

## 2016-05-13 LAB — UA/M W/RFLX CULTURE, ROUTINE
BILIRUBIN UA: NEGATIVE
Glucose, UA: NEGATIVE
KETONES UA: NEGATIVE
NITRITE UA: NEGATIVE
Specific Gravity, UA: >1.03 — ABNORMAL HIGH (ref 1.005–1.030)
UUROB: 0.2 mg/dL (ref 0.2–1.0)
pH, UA: 5.5 (ref 5.0–7.5)

## 2016-05-13 LAB — URINE CULTURE, REFLEX

## 2016-05-13 NOTE — Telephone Encounter (Signed)
Phone call Patient no problems with Cipro took without issues urinary tract symptoms have completely resolved. Reviewed labs and infection was sensitive to Cipro.

## 2016-07-08 ENCOUNTER — Ambulatory Visit (INDEPENDENT_AMBULATORY_CARE_PROVIDER_SITE_OTHER): Payer: 59 | Admitting: Obstetrics and Gynecology

## 2016-07-08 VITALS — BP 111/69 | HR 78 | Wt 164.0 lb

## 2016-07-08 DIAGNOSIS — Z3042 Encounter for surveillance of injectable contraceptive: Secondary | ICD-10-CM

## 2016-07-08 MED ORDER — MEDROXYPROGESTERONE ACETATE 150 MG/ML IM SUSP
150.0000 mg | Freq: Once | INTRAMUSCULAR | Status: AC
  Administered 2016-07-08: 150 mg via INTRAMUSCULAR

## 2016-07-08 NOTE — Progress Notes (Signed)
Patient ID: Brittany Page, female   DOB: 19-Aug-1990, 26 y.o.   MRN: [id number] Pt presents for depo-provera injection. Has bled for the first month and hopes she does not bleed more. Is aware that BTB, periods lighter or heavier, anytime but hopefully this may not be at all or seldom.

## 2016-07-11 ENCOUNTER — Encounter: Payer: Self-pay | Admitting: Family Medicine

## 2016-07-11 ENCOUNTER — Ambulatory Visit (INDEPENDENT_AMBULATORY_CARE_PROVIDER_SITE_OTHER): Payer: 59 | Admitting: Family Medicine

## 2016-07-11 VITALS — BP 107/76 | HR 97 | Temp 98.7°F | Ht 65.0 in | Wt 159.9 lb

## 2016-07-11 DIAGNOSIS — N898 Other specified noninflammatory disorders of vagina: Secondary | ICD-10-CM

## 2016-07-11 DIAGNOSIS — N3000 Acute cystitis without hematuria: Secondary | ICD-10-CM | POA: Diagnosis not present

## 2016-07-11 MED ORDER — NITROFURANTOIN MONOHYD MACRO 100 MG PO CAPS: 100.0000 mg | ORAL_CAPSULE | Freq: Two times a day (BID) | ORAL | 0 refills | Status: DC

## 2016-07-11 NOTE — Progress Notes (Signed)
BP 107/76 (BP Location: Right Arm, Patient Position: Sitting, Cuff Size: Large)   Pulse 97   Temp 98.7 F (37.1 C)   Ht  (1.651 m)   Wt 159 lb 14.4 oz (72.5 kg)   SpO2 99%   BMI 26.61 kg/m    Subjective:    Patient ID: Brittany Page, female    DOB: Aug 03, 1990, 26 y.o.   MRN: [id number]  HPI: Brittany Page is a 26 y.o. female  Chief Complaint  Patient presents with  . BV   VAGINAL DISCHARGE Duration: Beginning of February Discharge description: clear  Pruritus: no Dysuria: no Malodorous: yes Urinary frequency: yes Fevers: no Abdominal pain: no  Sexual activity: not sexually active History of sexually transmitted diseases: no Recent antibiotic use: no Context: recurrent BV  Treatments attempted: none  Relevant past medical, surgical, family and social history reviewed and updated as indicated. Interim medical history since our last visit reviewed. Allergies and medications reviewed and updated.  Review of Systems  Constitutional: Negative.   Respiratory: Negative.   Cardiovascular: Negative.   Genitourinary: Positive for vaginal discharge. Negative for decreased urine volume, difficulty urinating, dyspareunia, dysuria, enuresis, flank pain, frequency, genital sores, hematuria, menstrual problem, pelvic pain, urgency, vaginal bleeding and vaginal pain.  Psychiatric/Behavioral: Negative.     Per HPI unless specifically indicated above     Objective:    BP 107/76 (BP Location: Right Arm, Patient Position: Sitting, Cuff Size: Large)   Pulse 97   Temp 98.7 F (37.1 C)   Ht  (1.651 m)   Wt 159 lb 14.4 oz (72.5 kg)   SpO2 99%   BMI 26.61 kg/m   Wt Readings from Last 3 Encounters:  07/11/16 159 lb 14.4 oz (72.5 kg)  07/08/16 164 lb (74.4 kg)  05/05/16 160 lb (72.6 kg)    Physical Exam  Constitutional: She is oriented to person, place, and time. She appears well-developed and well-nourished. No distress.  HENT:  Head: Normocephalic and atraumatic.    Right Ear: Hearing normal.  Left Ear: Hearing normal.  Nose: Nose normal.  Eyes: Conjunctivae and lids are normal. Right eye exhibits no discharge. Left eye exhibits no discharge. No scleral icterus.  Pulmonary/Chest: Effort normal. No respiratory distress.  Abdominal: Hernia confirmed negative in the right inguinal area and confirmed negative in the left inguinal area.  Genitourinary: No labial fusion. There is no rash, tenderness, lesion or injury on the right labia. There is no rash, tenderness, lesion or injury on the left labia. No erythema, tenderness or bleeding in the vagina. No foreign body in the vagina. No signs of injury around the vagina. Vaginal discharge found.  Musculoskeletal: Normal range of motion.  Neurological: She is alert and oriented to person, place, and time.  Skin: Skin is warm, dry and intact. No rash noted. No erythema. No pallor.  Psychiatric: She has a normal mood and affect. Her speech is normal and behavior is normal. Judgment and thought content normal. Cognition and memory are normal.  Nursing note and vitals reviewed.   Results for orders placed or performed in visit on 05/05/16  Microscopic Examination  Result Value Ref Range   WBC, UA >30 (H) 0 - 5 /hpf   RBC, UA 3-10 (A) 0 - 2 /hpf   Epithelial Cells (non renal) 0-10 0 - 10 /hpf   Bacteria, UA Many (A) None seen/Few  UA/M w/rflx Culture, Routine  Result Value Ref Range   Specific Gravity, UA >1.030 (H) 1.005 -  1.030   pH, UA 5.5 5.0 - 7.5   Color, UA Yellow Yellow   Appearance Ur Cloudy (A) Clear   Leukocytes, UA 2+ (A) Negative   Protein, UA 2+ (A) Negative/Trace   Glucose, UA Negative Negative   Ketones, UA Negative Negative   RBC, UA 3+ (A) Negative   Bilirubin, UA Negative Negative   Urobilinogen, Ur 0.2 0.2 - 1.0 mg/dL   Nitrite, UA Negative Negative   Microscopic Examination See below:    Urinalysis Reflex Comment   Urine Culture, Routine  Result Value Ref Range   Urine Culture,  Routine Final report (A)    Urine Culture result 1 Escherichia coli (A)    ANTIMICROBIAL SUSCEPTIBILITY Comment       Assessment & Plan:   Problem List Items Addressed This Visit    None    Visit Diagnoses    Vaginal discharge    -  Primary   Wet prep negative. Likely physiologic discharge. Continue to monitor. UA +. Call with any concerns.    Relevant Orders   WET PREP FOR TRICH, YEAST, CLUE   UA/M w/rflx Culture, Routine   Acute cystitis without hematuria       Will treat with nitrofurantoin. Will return in about 10 days for recheck on urine.    Relevant Orders   UA/M w/rflx Culture, Routine       Follow up plan: Return if symptoms worsen or fail to improve.

## 2016-07-12 LAB — UA/M W/RFLX CULTURE, ROUTINE
Bilirubin, UA: NEGATIVE
GLUCOSE, UA: NEGATIVE
NITRITE UA: NEGATIVE
RBC, UA: NEGATIVE
Specific Gravity, UA: 1.02 (ref 1.005–1.030)
UUROB: 0.2 mg/dL (ref 0.2–1.0)
pH, UA: 6.5 (ref 5.0–7.5)

## 2016-07-12 LAB — MICROSCOPIC EXAMINATION: BACTERIA UA: NONE SEEN

## 2016-07-12 LAB — WET PREP FOR TRICH, YEAST, CLUE
CLUE CELL EXAM: NEGATIVE
Trichomonas Exam: NEGATIVE
Yeast Exam: NEGATIVE

## 2016-07-22 ENCOUNTER — Other Ambulatory Visit: Payer: 59

## 2016-07-22 DIAGNOSIS — N3000 Acute cystitis without hematuria: Secondary | ICD-10-CM

## 2016-07-23 LAB — UA/M W/RFLX CULTURE, ROUTINE
BILIRUBIN UA: NEGATIVE
GLUCOSE, UA: NEGATIVE
KETONES UA: NEGATIVE
LEUKOCYTES UA: NEGATIVE
Nitrite, UA: NEGATIVE
PROTEIN UA: NEGATIVE
RBC, UA: NEGATIVE
SPEC GRAV UA: 1.025 (ref 1.005–1.030)
Urobilinogen, Ur: 0.2 mg/dL (ref 0.2–1.0)
pH, UA: 6 (ref 5.0–7.5)

## 2016-07-23 LAB — MICROSCOPIC EXAMINATION: Bacteria, UA: NONE SEEN

## 2016-07-25 ENCOUNTER — Telehealth: Payer: Self-pay | Admitting: Family Medicine

## 2016-07-25 NOTE — Telephone Encounter (Signed)
Patient called to see if her urine sample showed any signs of her UTI or if it had cleared up.  Thanks  [phone]

## 2016-07-25 NOTE — Telephone Encounter (Signed)
I don't have a culture result on this- any ideas?

## 2016-07-25 NOTE — Telephone Encounter (Signed)
Spoke with lab, neg dip stick, so it did not reflex to a culture. Patient notified that urine was negative.

## 2016-09-17 ENCOUNTER — Ambulatory Visit (INDEPENDENT_AMBULATORY_CARE_PROVIDER_SITE_OTHER): Payer: 59 | Admitting: Family Medicine

## 2016-09-17 ENCOUNTER — Encounter: Payer: Self-pay | Admitting: Family Medicine

## 2016-09-17 VITALS — BP 112/75 | HR 98 | Temp 98.8°F | Wt 167.0 lb

## 2016-09-17 DIAGNOSIS — N898 Other specified noninflammatory disorders of vagina: Secondary | ICD-10-CM

## 2016-09-17 LAB — WET PREP FOR TRICH, YEAST, CLUE
Clue Cell Exam: NEGATIVE
TRICHOMONAS EXAM: NEGATIVE
Yeast Exam: NEGATIVE

## 2016-09-17 NOTE — Progress Notes (Signed)
   BP 112/75   Pulse 98   Temp 98.8 F (37.1 C)   Wt 167 lb (75.8 kg)   SpO2 99%   BMI 27.79 kg/m    Subjective:    Patient ID: Brittany Page, female    DOB: 04/21/1990, 26 y.o.   MRN: [id number]  HPI: Brittany Page is a 26 y.o. female  Chief Complaint  Patient presents with  . Vaginal Discharge    discharge and foul smell x 1-2 weeks  . Medication Refill    she would like a refill on the Ibuprofen 800mg  for her back pain.   Patient presents with 1-2 weeks of grey vaginal discharge and odor. Denies itching, burning, dysuria, vaginal lesions. Has been washing with only warm water, wearing cotton panties, and avoiding any scented products or douches. Does have a hx of BV.   Relevant past medical, surgical, family and social history reviewed and updated as indicated. Interim medical history since our last visit reviewed. Allergies and medications reviewed and updated.  Review of Systems  Constitutional: Negative.   HENT: Negative.   Respiratory: Negative.   Cardiovascular: Negative.   Gastrointestinal: Negative.   Genitourinary: Positive for vaginal discharge.  Musculoskeletal: Negative.   Neurological: Negative.   Psychiatric/Behavioral: Negative.    Per HPI unless specifically indicated above     Objective:    BP 112/75   Pulse 98   Temp 98.8 F (37.1 C)   Wt 167 lb (75.8 kg)   SpO2 99%   BMI 27.79 kg/m   Wt Readings from Last 3 Encounters:  09/17/16 167 lb (75.8 kg)  07/11/16 159 lb 14.4 oz (72.5 kg)  07/08/16 164 lb (74.4 kg)    Physical Exam  Constitutional: She is oriented to person, place, and time. She appears well-developed and well-nourished. No distress.  HENT:  Head: Atraumatic.  Eyes: Conjunctivae are normal. Pupils are equal, round, and reactive to light. No scleral icterus.  Neck: Normal range of motion. Neck supple.  Cardiovascular: Normal rate and normal heart sounds.   Pulmonary/Chest: Effort normal and breath sounds normal. No respiratory  distress.  Genitourinary: Vaginal discharge (grey white discharge) found.  Musculoskeletal: Normal range of motion.  Neurological: She is alert and oriented to person, place, and time.  Skin: Skin is warm and dry.  Psychiatric: She has a normal mood and affect. Her behavior is normal.  Nursing note and vitals reviewed.     Assessment & Plan:   Problem List Items Addressed This Visit    None    Visit Diagnoses    Vaginal discharge    -  Primary   Wet prep neg, recommended she start a probiotic and continue good feminine hygiene. F/u if worsening or no improvement   Relevant Orders   WET PREP FOR TRICH, YEAST, CLUE (Completed)       Follow up plan: Return if symptoms worsen or fail to improve.

## 2016-09-22 ENCOUNTER — Ambulatory Visit (INDEPENDENT_AMBULATORY_CARE_PROVIDER_SITE_OTHER): Payer: 59 | Admitting: Obstetrics and Gynecology

## 2016-09-22 VITALS — BP 105/71 | HR 79 | Wt 163.7 lb

## 2016-09-22 DIAGNOSIS — Z3042 Encounter for surveillance of injectable contraceptive: Secondary | ICD-10-CM | POA: Diagnosis not present

## 2016-09-22 MED ORDER — MEDROXYPROGESTERONE ACETATE 150 MG/ML IM SUSP
150.0000 mg | Freq: Once | INTRAMUSCULAR | Status: AC
  Administered 2016-09-22: 150 mg via INTRAMUSCULAR

## 2016-09-22 NOTE — Progress Notes (Signed)
Patient ID: Brittany GooMarissa Page, female   DOB: 08/25/1990, 26 y.o.   MRN: [id number] Pt presents for Depo-Provera 150 mg IM, given. Pt states she is starting to have a little break through bleeding. She requested to get injection one day early. She is leaving for the beach. No side effects. Pt trying to loose weight and has lost 4 lbs.  Next injection due: 9/27-10/03/2016 with nurse.

## 2016-09-23 ENCOUNTER — Ambulatory Visit: Payer: 59

## 2016-10-17 ENCOUNTER — Other Ambulatory Visit: Payer: Self-pay | Admitting: Obstetrics and Gynecology
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
## 2016-10-30 ENCOUNTER — Other Ambulatory Visit: Payer: Self-pay | Admitting: Obstetrics and Gynecology

## 2016-12-08 ENCOUNTER — Ambulatory Visit (INDEPENDENT_AMBULATORY_CARE_PROVIDER_SITE_OTHER): Payer: 59 | Admitting: Obstetrics and Gynecology

## 2016-12-08 VITALS — BP 118/67 | HR 97 | Ht 65.0 in | Wt 160.5 lb

## 2016-12-08 DIAGNOSIS — Z3042 Encounter for surveillance of injectable contraceptive: Secondary | ICD-10-CM

## 2016-12-08 MED ORDER — MEDROXYPROGESTERONE ACETATE 150 MG/ML IM SUSP
150.0000 mg | Freq: Once | INTRAMUSCULAR | Status: AC
  Administered 2016-12-08: 150 mg via INTRAMUSCULAR

## 2016-12-08 NOTE — Patient Instructions (Signed)

## 2016-12-08 NOTE — Progress Notes (Signed)
Date last pap: 09/04/2015 Last Depo-Provera: 09/22/2016 Side Effects if any: None. Serum HCG indicated? No pt within allotted window. Depo-Provera 150 mg IM given by: Dorita Fray, CMA in [facility] [place]. Pt tolerated well.  Next appointment due: 3 months for depo and AE. December 3-17

## 2016-12-10 DIAGNOSIS — Z3042 Encounter for surveillance of injectable contraceptive: Secondary | ICD-10-CM | POA: Insufficient documentation

## 2017-01-12 ENCOUNTER — Ambulatory Visit: Payer: 59 | Admitting: Family Medicine

## 2017-02-23 ENCOUNTER — Encounter: Payer: 59 | Admitting: Obstetrics and Gynecology

## 2017-04-01 ENCOUNTER — Encounter: Payer: Self-pay | Admitting: Obstetrics and Gynecology

## 2017-04-01 ENCOUNTER — Ambulatory Visit (INDEPENDENT_AMBULATORY_CARE_PROVIDER_SITE_OTHER): Payer: 59 | Admitting: Obstetrics and Gynecology

## 2017-04-01 VITALS — BP 123/59 | HR 95 | Wt 134.4 lb

## 2017-04-01 DIAGNOSIS — N898 Other specified noninflammatory disorders of vagina: Secondary | ICD-10-CM

## 2017-04-01 DIAGNOSIS — Z3009 Encounter for other general counseling and advice on contraception: Secondary | ICD-10-CM | POA: Diagnosis not present

## 2017-04-01 DIAGNOSIS — Z01419 Encounter for gynecological examination (general) (routine) without abnormal findings: Secondary | ICD-10-CM | POA: Diagnosis not present
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
## 2017-04-01 NOTE — Progress Notes (Signed)
Patient wants to discuss other options of birth control. She would like to get an IUD placed.

## 2017-04-01 NOTE — Patient Instructions (Addendum)
PREVENTION OF RECURRENT VAGINITIS:   1. First and foremost, eat a healthy diet rich in fruits and vegetables and low in sugar and refined carbohydrates. Studies have shown that yeast and harmful bacteria are more likely to be a problem in people with high sugar diets. One food believed to be particularly helpful is garlic. 2. Support your immune system: reduce stress, get adequate sleep and exercise regularly. 3. Eat plain, probiotic yogurt daily or take a daily vaginal probiotic (can be found in the feminine aisle at any pharmacy) containing Acidophillus (they should contain at least 1 billion CFU's- this is information found on the bottle).  4. Avoid harsh soaps, and detergents.  Can use vaginal washes like Vagisil or Summer's Eve.  5.Avoid routine douching, and if you notice that sex can trigger infections for you, use condoms. For some women who get infections easily, both douching and the alkaline nature of semen can disrupt the vaginal pH.     Health Maintenance, Female Adopting a healthy lifestyle and getting preventive care can go a long way to promote health and wellness. Talk with your health care provider about what schedule of regular examinations is right for you. This is a good chance for you to check in with your provider about disease prevention and staying healthy. In between checkups, there are plenty of things you can do on your own. Experts have done a lot of research about which lifestyle changes and preventive measures are most likely to keep you healthy. Ask your health care provider for more information. Weight and diet Eat a healthy diet  Be sure to include plenty of vegetables, fruits, low-fat dairy products, and lean protein.  Do not eat a lot of foods high in solid fats, added sugars, or salt.  Get regular exercise. This is one of the most important things you can do for your health. ? Most adults should exercise for at least 150 minutes each week. The exercise  should increase your heart rate and make you sweat (moderate-intensity exercise). ? Most adults should also do strengthening exercises at least twice a week. This is in addition to the moderate-intensity exercise.  Maintain a healthy weight  Body mass index (BMI) is a measurement that can be used to identify possible weight problems. It estimates body fat based on height and weight. Your health care provider can help determine your BMI and help you achieve or maintain a healthy weight.  For females 80 years of age and older: ? A BMI below 18.5 is considered underweight. ? A BMI of 18.5 to 24.9 is normal. ? A BMI of 25 to 29.9 is considered overweight. ? A BMI of 30 and above is considered obese.  Watch levels of cholesterol and blood lipids  You should start having your blood tested for lipids and cholesterol at 28 years of age, then have this test every 5 years.  You may need to have your cholesterol levels checked more often if: ? Your lipid or cholesterol levels are high. ? You are older than 28 years of age. ? You are at high risk for heart disease.  Cancer screening Lung Cancer  Lung cancer screening is recommended for adults 85-67 years old who are at high risk for lung cancer because of a history of smoking.  A yearly low-dose CT scan of the lungs is recommended for people who: ? Currently smoke. ? Have quit within the past 15 years. ? Have at least a 30-pack-year history of smoking. A pack  year is smoking an average of one pack of cigarettes a day for 1 year.  Yearly screening should continue until it has been 15 years since you quit.  Yearly screening should stop if you develop a health problem that would prevent you from having lung cancer treatment.  Breast Cancer  Practice breast self-awareness. This means understanding how your breasts normally appear and feel.  It also means doing regular breast self-exams. Let your health care provider know about any changes, no  matter how small.  If you are in your 20s or 30s, you should have a clinical breast exam (CBE) by a health care provider every 1-3 years as part of a regular health exam.  If you are 48 or older, have a CBE every year. Also consider having a breast X-ray (mammogram) every year.  If you have a family history of breast cancer, talk to your health care provider about genetic screening.  If you are at high risk for breast cancer, talk to your health care provider about having an MRI and a mammogram every year.  Breast cancer gene (BRCA) assessment is recommended for women who have family members with BRCA-related cancers. BRCA-related cancers include: ? Breast. ? Ovarian. ? Tubal. ? Peritoneal cancers.  Results of the assessment will determine the need for genetic counseling and BRCA1 and BRCA2 testing.  Cervical Cancer Your health care provider may recommend that you be screened regularly for cancer of the pelvic organs (ovaries, uterus, and vagina). This screening involves a pelvic examination, including checking for microscopic changes to the surface of your cervix (Pap test). You may be encouraged to have this screening done every 3 years, beginning at age 96.  For women ages 35-65, health care providers may recommend pelvic exams and Pap testing every 3 years, or they may recommend the Pap and pelvic exam, combined with testing for human papilloma virus (HPV), every 5 years. Some types of HPV increase your risk of cervical cancer. Testing for HPV may also be done on women of any age with unclear Pap test results.  Other health care providers may not recommend any screening for nonpregnant women who are considered low risk for pelvic cancer and who do not have symptoms. Ask your health care provider if a screening pelvic exam is right for you.  If you have had past treatment for cervical cancer or a condition that could lead to cancer, you need Pap tests and screening for cancer for at least  20 years after your treatment. If Pap tests have been discontinued, your risk factors (such as having a new sexual partner) need to be reassessed to determine if screening should resume. Some women have medical problems that increase the chance of getting cervical cancer. In these cases, your health care provider may recommend more frequent screening and Pap tests.  Colorectal Cancer  This type of cancer can be detected and often prevented.  Routine colorectal cancer screening usually begins at 28 years of age and continues through 28 years of age.  Your health care provider may recommend screening at an earlier age if you have risk factors for colon cancer.  Your health care provider may also recommend using home test kits to check for hidden blood in the stool.  A small camera at the end of a tube can be used to examine your colon directly (sigmoidoscopy or colonoscopy). This is done to check for the earliest forms of colorectal cancer.  Routine screening usually begins at age 56.  Direct examination of the colon should be repeated every 5-10 years through 28 years of age. However, you may need to be screened more often if early forms of precancerous polyps or small growths are found.  Skin Cancer  Check your skin from head to toe regularly.  Tell your health care provider about any new moles or changes in moles, especially if there is a change in a mole's shape or color.  Also tell your health care provider if you have a mole that is larger than the size of a pencil eraser.  Always use sunscreen. Apply sunscreen liberally and repeatedly throughout the day.  Protect yourself by wearing long sleeves, pants, a wide-brimmed hat, and sunglasses whenever you are outside.  Heart disease, diabetes, and high blood pressure  High blood pressure causes heart disease and increases the risk of stroke. High blood pressure is more likely to develop in: ? People who have blood pressure in the  high end of the normal range (130-139/85-89 mm Hg). ? People who are overweight or obese. ? People who are African American.  If you are 23-76 years of age, have your blood pressure checked every 3-5 years. If you are 52 years of age or older, have your blood pressure checked every year. You should have your blood pressure measured twice-once when you are at a hospital or clinic, and once when you are not at a hospital or clinic. Record the average of the two measurements. To check your blood pressure when you are not at a hospital or clinic, you can use: ? An automated blood pressure machine at a pharmacy. ? A home blood pressure monitor.  If you are between 57 years and 72 years old, ask your health care provider if you should take aspirin to prevent strokes.  Have regular diabetes screenings. This involves taking a blood sample to check your fasting blood sugar level. ? If you are at a normal weight and have a low risk for diabetes, have this test once every three years after 28 years of age. ? If you are overweight and have a high risk for diabetes, consider being tested at a younger age or more often. Preventing infection Hepatitis B  If you have a higher risk for hepatitis B, you should be screened for this virus. You are considered at high risk for hepatitis B if: ? You were born in a country where hepatitis B is common. Ask your health care provider which countries are considered high risk. ? Your parents were born in a high-risk country, and you have not been immunized against hepatitis B (hepatitis B vaccine). ? You have HIV or AIDS. ? You use needles to inject street drugs. ? You live with someone who has hepatitis B. ? You have had sex with someone who has hepatitis B. ? You get hemodialysis treatment. ? You take certain medicines for conditions, including cancer, organ transplantation, and autoimmune conditions.  Hepatitis C  Blood testing is recommended for: ? Everyone born  from [birth year] through 1966. ? Anyone with known risk factors for hepatitis C.  Sexually transmitted infections (STIs)  You should be screened for sexually transmitted infections (STIs) including gonorrhea and chlamydia if: ? You are sexually active and are younger than 28 years of age. ? You are older than 28 years of age and your health care provider tells you that you are at risk for this type of infection. ? Your sexual activity has changed since you were last screened and  you are at an increased risk for chlamydia or gonorrhea. Ask your health care provider if you are at risk.  If you do not have HIV, but are at risk, it may be recommended that you take a prescription medicine daily to prevent HIV infection. This is called pre-exposure prophylaxis (PrEP). You are considered at risk if: ? You are sexually active and do not regularly use condoms or know the HIV status of your partner(s). ? You take drugs by injection. ? You are sexually active with a partner who has HIV.  Talk with your health care provider about whether you are at high risk of being infected with HIV. If you choose to begin PrEP, you should first be tested for HIV. You should then be tested every 3 months for as long as you are taking PrEP. Pregnancy  If you are premenopausal and you may become pregnant, ask your health care provider about preconception counseling.  If you may become pregnant, take 400 to 800 micrograms (mcg) of folic acid every day.  If you want to prevent pregnancy, talk to your health care provider about birth control (contraception). Osteoporosis and menopause  Osteoporosis is a disease in which the bones lose minerals and strength with aging. This can result in serious bone fractures. Your risk for osteoporosis can be identified using a bone density scan.  If you are 13 years of age or older, or if you are at risk for osteoporosis and fractures, ask your health care provider if you should be  screened.  Ask your health care provider whether you should take a calcium or vitamin D supplement to lower your risk for osteoporosis.  Menopause may have certain physical symptoms and risks.  Hormone replacement therapy may reduce some of these symptoms and risks. Talk to your health care provider about whether hormone replacement therapy is right for you. Follow these instructions at home:  Schedule regular health, dental, and eye exams.  Stay current with your immunizations.  Do not use any tobacco products including cigarettes, chewing tobacco, or electronic cigarettes.  If you are pregnant, do not drink alcohol.  If you are breastfeeding, limit how much and how often you drink alcohol.  Limit alcohol intake to no more than 1 drink per day for nonpregnant women. One drink equals 12 ounces of beer, 5 ounces of wine, or 1 ounces of hard liquor.  Do not use street drugs.  Do not share needles.  Ask your health care provider for help if you need support or information about quitting drugs.  Tell your health care provider if you often feel depressed.  Tell your health care provider if you have ever been abused or do not feel safe at home. This information is not intended to replace advice given to you by your health care provider. Make sure you discuss any questions you have with your health care provider. Document Released: 09/23/2010 Document Revised: 08/16/2015 Document Reviewed: 12/12/2014 Elsevier Interactive Patient Education  [employer].

## 2017-04-04 LAB — NUSWAB VAGINITIS PLUS (VG+)
Atopobium vaginae: HIGH Score — AB
BVAB 2: HIGH {score} — AB
CHLAMYDIA TRACHOMATIS, NAA: NEGATIVE
Candida albicans, NAA: NEGATIVE
Candida glabrata, NAA: NEGATIVE
NEISSERIA GONORRHOEAE, NAA: NEGATIVE
TRICH VAG BY NAA: POSITIVE — AB

## 2017-04-06 ENCOUNTER — Telehealth: Payer: Self-pay

## 2017-04-06 MED ORDER — METRONIDAZOLE 500 MG PO TABS: 500.0000 mg | ORAL_TABLET | Freq: Two times a day (BID) | ORAL | 0 refills | Status: DC

## 2017-04-06 NOTE — Telephone Encounter (Signed)
-----   Message from Hildred LaserAnika Cherry, MD sent at 04/06/2017 11:08 AM EST ----- Please inform patient that she has 2 infections, bacterial vaginosis and trichomoniasis (sexually transmitted).  Both can be treated with the same medication, Flagyl 500 mg BID x 7 days.  We will need to postpone her IUD insertion that was scheduled for next week until at least 3-4 weeks after treatment to ensure that the infection is gone prior to placing the IUD. Please also encourage partner treatment (we can give a refill for him).

## 2017-04-06 NOTE — Telephone Encounter (Signed)
Pt aware. Med erx. IUD insertion r/s to 2/14 at 3:30.

## 2017-04-08 ENCOUNTER — Encounter: Payer: 59 | Admitting: Obstetrics and Gynecology

## 2017-04-22 ENCOUNTER — Telehealth: Payer: Self-pay | Admitting: Family Medicine

## 2017-04-22 NOTE — Telephone Encounter (Signed)
Copied from CRM [phone]. Topic: Quick Communication - See Telephone Encounter >> Apr 22, 2017  1:59 PM Arlyss Gandyichardson, Kellin Fifer N, NT wrote: CRM for notification. See Telephone encounter for: Pt wanting to speak with Dr. Laural BenesJohnson about prescribing her anxiety pills. She said they discussed this before and she would like to try them.   04/22/17.

## 2017-04-23 NOTE — Telephone Encounter (Signed)
Please get patient scheduled.  °

## 2017-04-23 NOTE — Telephone Encounter (Signed)
Appt made

## 2017-04-23 NOTE — Telephone Encounter (Signed)
I have not seen Brittany Page since April of last year-- she will need to be seen to discuss this.

## 2017-04-28 ENCOUNTER — Encounter: Payer: Self-pay | Admitting: Family Medicine

## 2017-04-28 ENCOUNTER — Ambulatory Visit (INDEPENDENT_AMBULATORY_CARE_PROVIDER_SITE_OTHER): Payer: 59 | Admitting: Family Medicine

## 2017-04-28 VITALS — BP 94/61 | HR 105 | Wt 135.4 lb

## 2017-04-28 DIAGNOSIS — F339 Major depressive disorder, recurrent, unspecified: Secondary | ICD-10-CM | POA: Insufficient documentation

## 2017-04-28 DIAGNOSIS — F322 Major depressive disorder, single episode, severe without psychotic features: Secondary | ICD-10-CM | POA: Diagnosis not present

## 2017-04-28 MED ORDER — SERTRALINE HCL 100 MG PO TABS: ORAL_TABLET | ORAL | 3 refills | Status: DC

## 2017-04-28 NOTE — Progress Notes (Signed)
BP 94/61 (BP Location: Left Arm, Patient Position: Sitting, Cuff Size: Normal)   Pulse (!) 105   Wt 135 lb 6 oz (61.4 kg)   SpO2 99%   BMI 22.53 kg/m    Subjective:    Patient ID: Brittany Page, female    DOB: [birth year], 27 y.o.   MRN: [id number]  HPI: Brittany Page is a 27 y.o. female  Chief Complaint  Patient presents with  . Anxiety   ANXIETY/DEPRESSION Duration: almost a year, since she had her son.  Anxious mood: yes  Excessive worrying: yes Irritability: yes  Sweating: no Nausea: no Palpitations:no Hyperventilation: no Panic attacks: no Agoraphobia: no  Obscessions/compulsions: yes Depressed mood: yes Depression screen PHQ 2/9 04/28/2017  Decreased Interest 1  Down, Depressed, Hopeless 3  PHQ - 2 Score 4  Altered sleeping 3  Tired, decreased energy 3  Change in appetite 2  Feeling bad or failure about yourself  3  Trouble concentrating 3  Moving slowly or fidgety/restless 2  Suicidal thoughts 1  PHQ-9 Score 21    GAD 7 : Generalized Anxiety Score 04/28/2017  Nervous, Anxious, on Edge 2  Control/stop worrying 3  Worry too much - different things 3  Trouble relaxing 1  Restless 1  Easily annoyed or irritable 3  Afraid - awful might happen 2  Total GAD 7 Score 15  Anxiety Difficulty Very difficult   Anhedonia: no Weight changes: yes Insomnia: yes hard to stay asleep  Hypersomnia: yes Fatigue/loss of energy: yes Feelings of worthlessness: yes Feelings of guilt: yes Impaired concentration/indecisiveness: yes Suicidal ideations: yes- nothing active  Crying spells: yes Recent Stressors/Life Changes: yes   Relationship problems: yes   Family stress: yes     Financial stress: yes    Job stress: no    Recent death/loss: no   Relevant past medical, surgical, family and social history reviewed and updated as indicated. Interim medical history since our last visit reviewed. Allergies and medications reviewed and updated.  Review of Systems    Constitutional: Negative.   Respiratory: Negative.   Cardiovascular: Negative.   Neurological: Negative.   Hematological: Negative.   Psychiatric/Behavioral: Positive for decreased concentration, dysphoric mood and sleep disturbance. Negative for agitation, behavioral problems, confusion, hallucinations, self-injury and suicidal ideas. The patient is nervous/anxious. The patient is not hyperactive.     Per HPI unless specifically indicated above     Objective:    BP 94/61 (BP Location: Left Arm, Patient Position: Sitting, Cuff Size: Normal)   Pulse (!) 105   Wt 135 lb 6 oz (61.4 kg)   SpO2 99%   BMI 22.53 kg/m   Wt Readings from Last 3 Encounters:  04/28/17 135 lb 6 oz (61.4 kg)  04/01/17 134 lb 6.4 oz (61 kg)  12/08/16 160 lb 8 oz (72.8 kg)    Physical Exam  Constitutional: She is oriented to person, place, and time. She appears well-developed and well-nourished. No distress.  HENT:  Head: Normocephalic and atraumatic.  Right Ear: Hearing normal.  Left Ear: Hearing normal.  Nose: Nose normal.  Eyes: Conjunctivae and lids are normal. Right eye exhibits no discharge. Left eye exhibits no discharge. No scleral icterus.  Cardiovascular: Normal rate, regular rhythm, normal heart sounds and intact distal pulses. Exam reveals no gallop and no friction rub.  No murmur heard. Pulmonary/Chest: Effort normal and breath sounds normal. No respiratory distress. She has no wheezes. She has no rales. She exhibits no tenderness.  Musculoskeletal: Normal range of motion.  Neurological: She is alert and oriented to person, place, and time.  Skin: Skin is intact. No rash noted. She is not diaphoretic.  Psychiatric: She has a normal mood and affect. Her speech is normal and behavior is normal. Judgment and thought content normal. Cognition and memory are normal.  Nursing note and vitals reviewed.   Results for orders placed or performed in visit on 04/01/17  NuSwab Vaginitis Plus (VG+)   Result Value Ref Range   Atopobium vaginae High - 2 (A) Score   BVAB 2 High - 2 (A) Score   Megasphaera 1 Low - 0 Score   Candida albicans, NAA Negative Negative   Candida glabrata, NAA Negative Negative   Trich vag by NAA Positive (A) Negative   Chlamydia trachomatis, NAA Negative Negative   Neisseria gonorrhoeae, NAA Negative Negative      Assessment & Plan:   Problem List Items Addressed This Visit      Other   Depression, major, single episode, severe (HCC) - Primary    Possibly post-partum. Will check labs to look for organic cause. Start zoloft. Rx sent to her pharmacy. Recheck 1 month.       Relevant Medications   sertraline (ZOLOFT) 100 MG tablet   Other Relevant Orders   CBC with Differential/Platelet   TSH   Comprehensive metabolic panel   VITAMIN D 25 Hydroxy (Vit-D Deficiency, Fractures)       Follow up plan: Return in about 4 weeks (around 05/26/2017) for Follow up mood.

## 2017-04-28 NOTE — Patient Instructions (Addendum)
Perinatal Depression When a woman feels excessive sadness, anger, or anxiety during pregnancy or during the first 12 months after she gives birth, she has a condition called perinatal depression. Depression can interfere with work, school, relationships, and other everyday activities. If it is not managed properly, it can also cause problems in the mother and her baby. Sometimes, perinatal depression is left untreated because symptoms are thought to be normal mood swings during and right after pregnancy. If you have symptoms of depression, it is important to talk with your health care provider. What are the causes? The exact cause of this condition is not known. Hormonal changes during and after pregnancy may play a role in causing perinatal depression. What increases the risk? You are more likely to develop this condition if:  You have a personal or family history of depression, anxiety, or mood disorders.  You experience a stressful life event during pregnancy, such as the death of a loved one.  You have a lot of regular life stress.  You do not have support from family members or loved ones, or you are in an abusive relationship.  What are the signs or symptoms? Symptoms of this condition include:  Feeling sad or hopeless.  Feelings of guilt.  Feeling irritable or overwhelmed.  Changes in your appetite.  Lack of energy or motivation.  Sleep problems.  Difficulty concentrating or completing tasks.  Loss of interest in hobbies or relationships.  Headaches or stomach problems that do not go away.  How is this diagnosed? This condition is diagnosed based on a physical exam and mental evaluation. In some cases, your health care provider may use a depression screening tool. These tools include a list of questions that can help a health care provider diagnose depression. Your health care provider may refer you to a mental health expert who specializes in depression. How is this  treated? This condition may be treated with:  Medicines. Your health care provider will only give you medicines that have been proven safe for pregnancy and breastfeeding.  Talk therapy with a mental health professional to help change your patterns of thinking (cognitive behavioral therapy).  Support groups.  Brain stimulation or light therapies.  Stress reduction therapies, such as mindfulness.  Follow these instructions at home: Lifestyle  Do not use any products that contain nicotine or tobacco, such as cigarettes and e-cigarettes. If you need help quitting, ask your health care provider.  Do not use alcohol when you are pregnant. After your baby is born, limit alcohol intake to no more than 1 drink a day. One drink equals 12 oz of beer, 5 oz of wine, or 1 oz of hard liquor.  Consider joining a support group for new mothers. Ask your health care provider for recommendations.  Take good care of yourself. Make sure you: ? Get plenty of sleep. If you are having trouble sleeping, talk with your health care provider. ? Eat a healthy diet. This includes plenty of fruits and vegetables, whole grains, and lean proteins. ? Exercise regularly, as told by your health care provider. Ask your health care provider what exercises are safe for you. General instructions  Take over-the-counter and prescription medicines only as told by your health care provider.  Talk with your partner or family members about your feelings during pregnancy. Share any concerns or anxieties that you may have.  Ask for help with tasks or chores when you need it. Ask friends and family members to provide meals, watch your children,   or help with cleaning.  Keep all follow-up visits as told by your health care provider. This is important. Contact a health care provider if:  You (or people close to you) notice that you have any symptoms of depression.  You have depression and your symptoms get worse.  You  experience side effects from medicines, such as nausea or sleep problems. Get help right away if:  You feel like hurting yourself, your baby, or someone else. If you ever feel like you may hurt yourself or others, or have thoughts about taking your own life, get help right away. You can go to your nearest emergency department or call:  Your local emergency services (911 in the [place]  A suicide crisis helpline, such as the National Suicide Prevention Lifeline at 1-800-273-8255. This is open 24 hours a day.  Summary  Perinatal depression is when a woman feels excessive sadness, anger, or anxiety during pregnancy or during the first 12 months after she gives birth.  If perinatal depression is not treated, it can lead to health problems for the mother and her baby.  This condition is treated with medicines, talk therapy, stress reduction therapies, or a combination of two or more treatments.  Talk with your partner or family members about your feelings. Do not be afraid to ask for help. This information is not intended to replace advice given to you by your health care provider. Make sure you discuss any questions you have with your health care provider. Document Released: 05/07/2016 Document Revised: 05/07/2016 Document Reviewed: 05/07/2016 Elsevier Interactive Patient Education  2018 [employer] Perinatal Anxiety When a woman feels excessive tension or worry (anxiety) during pregnancy or during the first 12 months after she gives birth, she has a condition called perinatal anxiety. Anxiety can interfere with work, school, relationships, and other everyday activities. If it is not managed properly, it can also cause problems in the mother and her baby.  If you are pregnant and you have symptoms of an anxiety disorder, it is important to talk with your health care provider. What are the causes? The exact cause of this condition is not known. Hormonal changes during and after pregnancy may  play a role in causing perinatal anxiety. What increases the risk? You are more likely to develop this condition if:  You have a personal or family history of depression, anxiety, or mood disorders.  You experience a stressful life event during pregnancy, such as the death of a loved one.  You have a lot of regular life stress, such as being a single parent.  You have thyroid problems.  What are the signs or symptoms? Perinatal anxiety can be different for everyone. It may include:  Panic attacks (panic disorder). These are intense episodes of fear or discomfort that may also cause sweating, nausea, shortness of breath, or fear of dying. They usually last 5-15 minutes.  Reliving an upsetting (traumatic) event through distressing thoughts, dreams, or flashbacks (post-traumatic stress disorder, or PTSD).  Excessive worry about multiple problems (generalized anxiety disorder).  Fear and stress about leaving certain people or loved ones (separation anxiety).  Performing repetitive tasks (compulsions) to relieve stress or worry (obsessive compulsive disorder, or OCD).  Fear of certain objects or situations (phobias).  Excessive worrying, such as a constant feeling that something bad is going to happen.  Inability to relax.  Difficulty concentrating.  Sleep problems.  Frequent nightmares or disturbing thoughts.  How is this diagnosed? This condition is diagnosed based on a physical   exam and mental evaluation. In some cases, your health care provider may use an anxiety screening tool. These tools include a list of questions that can help a health care provider diagnose anxiety. Your health care provider may refer you to a mental health expert who specializes in anxiety. How is this treated? This condition may be treated with:  Medicines. Your health care provider will only give you medicines that have been proven safe for pregnancy and breastfeeding.  Talk therapy with a mental  health professional to help change your patterns of thinking (cognitive behavioral therapy).  Mindfulness-based stress reduction.  Other relaxation therapies, such as deep breathing or guided muscle relaxation.  Support groups.  Follow these instructions at home: Lifestyle  Do not use any products that contain nicotine or tobacco, such as cigarettes and e-cigarettes. If you need help quitting, ask your health care provider.  Do not use alcohol when you are pregnant. After your baby is born, limit alcohol intake to no more than 1 drink a day. One drink equals 12 oz of beer, 5 oz of wine, or 1 oz of hard liquor.  Consider joining a support group for new mothers. Ask your health care provider for recommendations.  Take good care of yourself. Make sure you: ? Get plenty of sleep. If you are having trouble sleeping, talk with your health care provider. ? Eat a healthy diet. This includes plenty of fruits and vegetables, whole grains, and lean proteins. ? Exercise regularly, as told by your health care provider. Ask your health care provider what exercises are safe for you. General instructions  Take over-the-counter and prescription medicines only as told by your health care provider.  Talk with your partner or family members about your feelings during pregnancy. Share any concerns or fears that you may have.  Ask for help with tasks or chores when you need it. Ask friends and family members to provide meals, watch your children, or help with cleaning.  Keep all follow-up visits as told by your health care provider. This is important. Contact a health care provider if:  You (or people close to you) notice that you have any symptoms of anxiety or depression.  You have anxiety and your symptoms get worse.  You experience side effects from medicines, such as nausea or sleep problems. Get help right away if:  You feel like hurting yourself, your baby, or someone else. If you ever feel  like you may hurt yourself or others, or have thoughts about taking your own life, get help right away. You can go to your nearest emergency department or call:  Your local emergency services (911 in the [place]  A suicide crisis helpline, such as the National Suicide Prevention Lifeline at 1-800-273-8255. This is open 24 hours a day.  Summary  Perinatal anxiety is when a woman feels excessive tension or worry during pregnancy or during the first 12 months after she gives birth.  Perinatal anxiety may include panic attacks, post-traumatic stress disorder, separation anxiety, phobias, or generalized anxiety.  Perinatal anxiety can cause physical health problems in the mother and baby if not properly managed.  This condition is treated with medicines, talk therapy, stress reduction therapies, or a combination of two or more treatments.  Talk with your partner or family members about your concerns or fears. Do not be afraid to ask for help. This information is not intended to replace advice given to you by your health care provider. Make sure you discuss any questions you   you have with your health care provider. Document Released: 05/07/2016 Document Revised: 05/07/2016 Document Reviewed: 05/07/2016 Elsevier Interactive Patient Education  [employer]2018 Else[employer] Inc.

## 2017-04-28 NOTE — Assessment & Plan Note (Signed)
Possibly post-partum. Will check labs to look for organic cause. Start zoloft. Rx sent to her pharmacy. Recheck 1 month.

## 2017-04-29 ENCOUNTER — Other Ambulatory Visit: Payer: Self-pay | Admitting: Family Medicine

## 2017-04-29 LAB — COMPREHENSIVE METABOLIC PANEL
ALBUMIN: 4.3 g/dL (ref 3.5–5.5)
ALK PHOS: 70 IU/L (ref 39–117)
ALT: 10 IU/L (ref 0–32)
AST: 12 IU/L (ref 0–40)
Albumin/Globulin Ratio: 1.7 (ref 1.2–2.2)
BUN / CREAT RATIO: 13 (ref 9–23)
BUN: 12 mg/dL (ref 6–20)
Bilirubin Total: 0.2 mg/dL (ref 0.0–1.2)
CHLORIDE: 105 mmol/L (ref 96–106)
CO2: 23 mmol/L (ref 20–29)
CREATININE: 0.89 mg/dL (ref 0.57–1.00)
Calcium: 9.4 mg/dL (ref 8.7–10.2)
GFR calc Af Amer: 103 mL/min/{1.73_m2} (ref >59)
GFR calc non Af Amer: 89 mL/min/{1.73_m2} (ref >59)
GLUCOSE: 97 mg/dL (ref 65–99)
Globulin, Total: 2.6 g/dL (ref 1.5–4.5)
Potassium: 3.8 mmol/L (ref 3.5–5.2)
Sodium: 142 mmol/L (ref 134–144)
Total Protein: 6.9 g/dL (ref 6.0–8.5)

## 2017-04-29 LAB — CBC WITH DIFFERENTIAL/PLATELET
BASOS ABS: 0 10*3/uL (ref 0.0–0.2)
Basos: 1 %
EOS (ABSOLUTE): 0.2 10*3/uL (ref 0.0–0.4)
EOS: 2 %
HEMATOCRIT: 35.4 % (ref 34.0–46.6)
HEMOGLOBIN: 11.9 g/dL (ref 11.1–15.9)
IMMATURE GRANULOCYTES: 0 %
Immature Grans (Abs): 0 10*3/uL (ref 0.0–0.1)
Lymphocytes Absolute: 3.3 10*3/uL — ABNORMAL HIGH (ref 0.7–3.1)
Lymphs: 41 %
MCH: 30.8 pg (ref 26.6–33.0)
MCHC: 33.6 g/dL (ref 31.5–35.7)
MCV: 92 fL (ref 79–97)
MONOCYTES: 5 %
Monocytes Absolute: 0.4 10*3/uL (ref 0.1–0.9)
NEUTROS PCT: 51 %
Neutrophils Absolute: 4.1 10*3/uL (ref 1.4–7.0)
Platelets: 256 10*3/uL (ref 150–379)
RBC: 3.86 x10E6/uL (ref 3.77–5.28)
RDW: 13.5 % (ref 12.3–15.4)
WBC: 8 10*3/uL (ref 3.4–10.8)

## 2017-04-29 LAB — VITAMIN D 25 HYDROXY (VIT D DEFICIENCY, FRACTURES): VIT D 25 HYDROXY: 7.8 ng/mL — AB (ref 30.0–100.0)

## 2017-04-29 LAB — TSH: TSH: 0.925 u[IU]/mL (ref 0.450–4.500)

## 2017-04-29 MED ORDER — VITAMIN D (ERGOCALCIFEROL) 1.25 MG (50000 UNIT) PO CAPS: 50000.0000 [IU] | ORAL_CAPSULE | ORAL | 0 refills | Status: DC

## 2017-05-07 ENCOUNTER — Encounter: Payer: 59 | Admitting: Obstetrics and Gynecology

## 2017-05-12 ENCOUNTER — Encounter: Payer: Self-pay | Admitting: Obstetrics and Gynecology

## 2017-05-12 ENCOUNTER — Ambulatory Visit (INDEPENDENT_AMBULATORY_CARE_PROVIDER_SITE_OTHER): Payer: 59 | Admitting: Obstetrics and Gynecology

## 2017-05-12 VITALS — BP 114/71 | HR 86 | Ht 64.0 in | Wt 134.5 lb

## 2017-05-12 DIAGNOSIS — Z8619 Personal history of other infectious and parasitic diseases: Secondary | ICD-10-CM

## 2017-05-12 DIAGNOSIS — N760 Acute vaginitis: Secondary | ICD-10-CM

## 2017-05-12 DIAGNOSIS — Z30430 Encounter for insertion of intrauterine contraceptive device: Secondary | ICD-10-CM

## 2017-05-12 DIAGNOSIS — B9689 Other specified bacterial agents as the cause of diseases classified elsewhere: Secondary | ICD-10-CM | POA: Diagnosis not present

## 2017-05-12 LAB — POCT URINE PREGNANCY: Preg Test, Ur: NEGATIVE

## 2017-05-12 NOTE — Patient Instructions (Signed)

## 2017-05-12 NOTE — Progress Notes (Signed)
    GYNECOLOGY PROGRESS NOTE  Subjective:    Patient ID: Brittany Page, female    DOB: 04/04/1990, 27 y.o.   MRN: [id number]  HPI  Patient is a 27 y.o. 692P1101 female who presents for Mirena IUD insertion, and TOC from recent trichomoniasis and BV infection.  Patient notes compliance with medications. Has not had intercourse since diagnosis.  She notes that she has still not had a cycle since her last Depo provera injection in September.   The following portions of the patient's history were reviewed and updated as appropriate: allergies, current medications, past family history, past medical history, past social history, past surgical history and problem list.  Review of Systems Pertinent items noted in HPI and remainder of comprehensive ROS otherwise negative.   Objective:   Blood pressure 114/71, pulse 86, height 5\' 4"  (1.626 m), weight 134 lb 8 oz (61 kg), not currently breastfeeding. General appearance: alert and no distress Abdomen: soft, non-tender; bowel sounds normal; no masses,  no organomegaly Pelvic: external genitalia normal, rectovaginal septum normal.  Vagina with moderate white discharge.  Cervix normal appearing, no lesions and no motion tenderness.  Uterus mobile, nontender, normal shape and size.  Adnexae non-palpable, nontender bilaterally.    Microscopic wet-mount exam shows few clue cells, normal epithelial cells. No trichomonads, yeast, or hyphae.  Assessment:   H/o trichomoniasis H/o BV infection Encounter for IUD insertion  Plan:   1. Trichomoniasis and BV infection treated with Flagyl.  Patient's wet prep negative for pathogens today. Ok to insert IUD. Continued to counsel on safe sex practices.  2. Mirena IUD inserted today without difficulty (see procedure note below).  She will need to have f/u in 4 weeks for IUD check.      GYNECOLOGY OFFICE PROCEDURE NOTE  Brittany Page is a 27 y.o. G2P1101 here for Mirena IUD insertion.  Last pap smear was on  08/2015 and was normal.  IUD Insertion Procedure Note Patient identified, informed consent performed, consent signed.   Discussed risks of irregular bleeding, cramping, infection, malpositioning or misplacement of the IUD outside the uterus which may require further procedure such as laparoscopy. Time out was performed.  Urine pregnancy test negative.  Speculum placed in the vagina.  Cervix visualized.  Cleaned with Betadine x 2.  Grasped anteriorly with a single tooth tenaculum.  Uterus sounded to 9 cm.  Mirena IUD placed per manufacturer's recommendations.  Strings trimmed to 3 cm. Tenaculum was removed, good hemostasis noted.  Patient tolerated procedure well.   Patient was given post-procedure instructions.  She was advised to have backup contraception for one week.  Patient was also asked to check IUD strings periodically and follow up in 4 weeks for IUD check.   Lot: [id number] Exp: 08/2019    [name], Annsley Akkerman, MD [facility]

## 2017-05-25 ENCOUNTER — Ambulatory Visit: Payer: 59 | Admitting: Family Medicine

## 2017-06-01 ENCOUNTER — Ambulatory Visit: Payer: 59 | Admitting: Family Medicine

## 2017-06-09 ENCOUNTER — Encounter: Payer: Self-pay | Admitting: Obstetrics and Gynecology

## 2017-06-09 ENCOUNTER — Ambulatory Visit (INDEPENDENT_AMBULATORY_CARE_PROVIDER_SITE_OTHER): Payer: 59 | Admitting: Obstetrics and Gynecology

## 2017-06-09 VITALS — BP 99/66 | HR 101 | Ht 64.0 in | Wt 134.4 lb

## 2017-06-09 DIAGNOSIS — Z30431 Encounter for routine checking of intrauterine contraceptive device: Secondary | ICD-10-CM

## 2017-06-09 MED ORDER — IBUPROFEN 800 MG PO TABS: 800.0000 mg | ORAL_TABLET | Freq: Three times a day (TID) | ORAL | 1 refills | Status: DC | PRN

## 2017-06-09 NOTE — Progress Notes (Signed)
Pt is having a cycle about 2 days after she got the IUD and she still have it along with spotting. .Marland Kitchen

## 2017-06-09 NOTE — Progress Notes (Signed)
     GYNECOLOGY OFFICE PROGRESS NOTE  History:  27 y.o. G2P1101 here today for today for IUD string check;  Mirena IUD was placed  05/12/2017. No major complaints about the IUD, no concerning side effects. Does note that after her period she continues to have daily spotting.  Also reports cramping, controlled with Ibuprofen.   The following portions of the patient's history were reviewed and updated as appropriate: allergies, current medications, past family history, past medical history, past social history, past surgical history and problem list. Last pap smear on 08/25/2015 was normal, negative HRHPV.  Review of Systems:  Pertinent items are noted in HPI.   Objective:  Physical Exam Blood pressure 99/66, pulse (!) 101, height 5\' 4"  (1.626 m), weight 134 lb 6.4 oz (61 kg), not currently breastfeeding. CONSTITUTIONAL: Well-developed, well-nourished female in no acute distress.  ABDOMEN: Soft, no distention noted.   PELVIC: Normal appearing external genitalia; normal appearing vaginal mucosa and cervix.  IUD strings visualized, about 3 cm in length outside cervix. Scant brown vaginal discharge in vault.  EXTREMITIES: non-tender, no edema or erythema or lesions NEUROLOGIC: Grossly normal  Assessment & Plan:  Normal IUD check. Reiterated that irregular bleeding or spotting can occur within first few months of placement of IUD. Patient also desires refill on Ibuprofen for cramping.  Patient to keep IUD in place for five years; can come in for removal if she desires pregnancy within the next five years. Routine preventative health maintenance measures emphasized.    Brittany Page, Brittany Guynes, MD [facility]

## 2017-07-15 ENCOUNTER — Ambulatory Visit (INDEPENDENT_AMBULATORY_CARE_PROVIDER_SITE_OTHER): Payer: 59 | Admitting: Obstetrics and Gynecology

## 2017-07-15 ENCOUNTER — Encounter: Payer: Self-pay | Admitting: Obstetrics and Gynecology

## 2017-07-15 VITALS — BP 111/75 | HR 97 | Ht 64.0 in | Wt 131.0 lb

## 2017-07-15 DIAGNOSIS — N760 Acute vaginitis: Secondary | ICD-10-CM | POA: Diagnosis not present

## 2017-07-15 DIAGNOSIS — Z113 Encounter for screening for infections with a predominantly sexual mode of transmission: Secondary | ICD-10-CM

## 2017-07-15 DIAGNOSIS — Z30431 Encounter for routine checking of intrauterine contraceptive device: Secondary | ICD-10-CM | POA: Diagnosis not present

## 2017-07-15 DIAGNOSIS — B9689 Other specified bacterial agents as the cause of diseases classified elsewhere: Secondary | ICD-10-CM | POA: Diagnosis not present

## 2017-07-15 MED ORDER — SECNIDAZOLE 2 G PO PACK: 1.0000 | PACK | Freq: Once | ORAL | 0 refills | Status: AC

## 2017-07-15 NOTE — Progress Notes (Signed)
Pt is doing well. Pt wants a STD test done. Also partner felt the string of her IUD.

## 2017-07-15 NOTE — Progress Notes (Addendum)
    GYNECOLOGY OFFICE PROGRESS NOTE  History:  27 y.o. G2P1101 here today for today for IUD string check; Mirena IUD was placed  06/09/3017. Reports that partner could feel strings with intercourse, no concerning side effects.  Patient also desires to be checked for bacterial vaginosis. Notes an increase discharge, yellow, thick, with mild odor. Has been ongoing x 2 weeks. Intercourse was unprotected. Will also have STD screen.   The following portions of the patient's history were reviewed and updated as appropriate: allergies, current medications, past family history, past medical history, past social history, past surgical history and problem list. Last pap smear on 08/2015 was normal, negative HRHPV.  Review of Systems:  Pertinent items are noted in HPI.   Objective:  Physical Exam Blood pressure 111/75, pulse 97, height 5\' 4"  (1.626 m), weight 131 lb (59.4 kg), not currently breastfeeding. CONSTITUTIONAL: Well-developed, well-nourished female in no acute distress.  ABDOMEN: Soft, no distention noted.   PELVIC: Normal appearing external genitalia; normal appearing vaginal mucosa and cervix.  IUD strings visualized, about 4 cm in length outside cervix.  EXTREMITIES: extremities normal, atraumatic, no cyanosis or edema NEUROLOGIC: Grossly normal   Microscopic wet-mount exam shows clue cells. KOH done.  No trichomonads or yeast.   Assessment & Plan:  Normal IUD check. Strings trimmed for partner's comfort.  Bacterial vaginosis. Will prescribe Solosec. STD screen performed. Encourage safe sex practices Patient to keep IUD in place for five years; can come in for removal if she desires pregnancy within the next five years. Routine preventative health maintenance measures emphasized.     [name], Arthur Aydelotte, MD [facility]

## 2017-07-16 ENCOUNTER — Telehealth: Payer: Self-pay | Admitting: Obstetrics and Gynecology

## 2017-07-16 NOTE — Telephone Encounter (Signed)
Pt was called back and given the numbers she needed to have her prescription filled.

## 2017-07-16 NOTE — Telephone Encounter (Signed)
He patient called and stated that she would like to speak with Dr. Valentino Saxonherry as soon as possible ,to get the coupon code for her medication while she is at her pharmacy. Please advise.

## 2017-07-18 LAB — GC/CHLAMYDIA PROBE AMP
Chlamydia trachomatis, NAA: NEGATIVE
Neisseria gonorrhoeae by PCR: NEGATIVE

## 2017-08-04 ENCOUNTER — Ambulatory Visit (INDEPENDENT_AMBULATORY_CARE_PROVIDER_SITE_OTHER): Payer: 59 | Admitting: Family Medicine

## 2017-08-04 ENCOUNTER — Encounter: Payer: Self-pay | Admitting: Family Medicine

## 2017-08-04 VITALS — BP 103/70 | HR 93 | Temp 98.2°F | Wt 131.0 lb

## 2017-08-04 DIAGNOSIS — E559 Vitamin D deficiency, unspecified: Secondary | ICD-10-CM

## 2017-08-04 DIAGNOSIS — F322 Major depressive disorder, single episode, severe without psychotic features: Secondary | ICD-10-CM | POA: Diagnosis not present

## 2017-08-04 MED ORDER — SERTRALINE HCL 100 MG PO TABS: ORAL_TABLET | ORAL | 3 refills | Status: DC

## 2017-08-04 MED ORDER — HYDROXYZINE HCL 25 MG PO TABS: 25.0000 mg | ORAL_TABLET | Freq: Three times a day (TID) | ORAL | 3 refills | Status: DC | PRN

## 2017-08-04 NOTE — Progress Notes (Deleted)
   BP 103/70 (BP Location: Left Arm, Patient Position: Sitting, Cuff Size: Normal)   Pulse 93   Temp 98.2 F (36.8 C)   Wt 131 lb (59.4 kg)   SpO2 99%   BMI 22.49 kg/m    Subjective:    Patient ID: Brittany Page, female    DOB: Nov 18, 1990, 27 y.o.   MRN: [id number]  HPI: Brittany Page is a 27 y.o. female  Chief Complaint  Patient presents with  . Anxiety  . vitamin d defiency   Finished her vitamin D- needs recheck   DEPRESSION Mood status: {Blank single:19197::"controlled","uncontrolled","better","worse","exacerbated","stable"} Satisfied with current treatment?: {Blank single:19197::"yes","no"} Symptom severity: {Blank single:19197::"mild","moderate","severe"}  Duration of current treatment : {Blank single:19197::"chronic","months","years"} Side effects: {Blank single:19197::"yes","no"} Medication compliance: {Blank single:19197::"excellent compliance","good compliance","fair compliance","poor compliance"} Psychotherapy/counseling: {Blank single:19197::"yes","no"} {Blank single:19197::"current","in the past"} Previous psychiatric medications: {Blank multiple:19196::"abilify","amitryptiline","buspar","celexa","cymbalta","depakote","effexor","lamictal","lexapro","lithium","nortryptiline","paxil","prozac","pristiq (desvenlafaxine","seroquel","wellbutrin","zoloft","zyprexa"} Depressed mood: {Blank single:19197::"yes","no"} Anxious mood: {Blank single:19197::"yes","no"} Anhedonia: {Blank single:19197::"yes","no"} Significant weight loss or gain: {Blank single:19197::"yes","no"} Insomnia: {Blank single:19197::"yes","no"} {Blank single:19197::"hard to fall asleep","hard to stay asleep"} Fatigue: {Blank single:19197::"yes","no"} Feelings of worthlessness or guilt: {Blank single:19197::"yes","no"} Impaired concentration/indecisiveness: {Blank single:19197::"yes","no"} Suicidal ideations: {Blank single:19197::"yes","no"} Hopelessness: {Blank single:19197::"yes","no"} Crying spells:  {Blank single:19197::"yes","no"} Depression screen [facility] 2/9 08/04/2017 04/28/2017  Decreased Interest 1 1  Down, Depressed, Hopeless 2 3  PHQ - 2 Score 3 4  Altered sleeping 1 3  Tired, decreased energy 1 3  Change in appetite 0 2  Feeling bad or failure about yourself  0 3  Trouble concentrating 0 3  Moving slowly or fidgety/restless 0 2  Suicidal thoughts 0 1  PHQ-9 Score 5 21    Relevant past medical, surgical, family and social history reviewed and updated as indicated. Interim medical history since our last visit reviewed. Allergies and medications reviewed and updated.  Review of Systems  Per HPI unless specifically indicated above     Objective:    BP 103/70 (BP Location: Left Arm, Patient Position: Sitting, Cuff Size: Normal)   Pulse 93   Temp 98.2 F (36.8 C)   Wt 131 lb (59.4 kg)   SpO2 99%   BMI 22.49 kg/m   Wt Readings from Last 3 Encounters:  08/04/17 131 lb (59.4 kg)  07/15/17 131 lb (59.4 kg)  06/09/17 134 lb 6.4 oz (61 kg)    Physical Exam  Results for orders placed or performed in visit on 07/15/17  GC/Chlamydia Probe Amp  Result Value Ref Range   Chlamydia trachomatis, NAA Negative Negative   Neisseria gonorrhoeae by PCR Negative Negative      Assessment & Plan:   Problem List Items Addressed This Visit      Other   Depression, major, single episode, severe (HCC) - Primary    Has not been taking her sertraline. Will start her back on her sertraline and start hydroxyzine for anxiety attacks. Recheck 1 month. Call with any concerns .      Relevant Medications   sertraline (ZOLOFT) 100 MG tablet   hydrOXYzine (ATARAX/VISTARIL) 25 MG tablet    Other Visit Diagnoses    Vitamin D deficiency       Rechecking levels today. Will treat as needed.    Relevant Orders   VITAMIN D 25 Hydroxy (Vit-D Deficiency, Fractures)       Follow up plan: Return in about 1 month (around 09/01/2017) for follow up mood.

## 2017-08-04 NOTE — Progress Notes (Signed)
BP 103/70 (BP Location: Left Arm, Patient Position: Sitting, Cuff Size: Normal)   Pulse 93   Temp 98.2 F (36.8 C)   Wt 131 lb (59.4 kg)   SpO2 99%   BMI 22.49 kg/m    Subjective:    Patient ID: Brittany Page, female    DOB: 06-29-1990, 27 y.o.   MRN: [id number]  HPI: Brittany Page is a 27 y.o. female  Chief Complaint  Patient presents with  . Anxiety  . vitamin d defiency   DEPRESSION- has not been taking her sertaline for 3 months Mood status: exacerbated Satisfied with current treatment?: no Symptom severity: moderate  Duration of current treatment : not currently on anything Side effects: no Medication compliance: excellent compliance Psychotherapy/counseling: no  Previous psychiatric medications: sertraline Depressed mood: yes Anxious mood: yes Anhedonia: no Significant weight loss or gain: no Insomnia: yes hard to fall asleep Fatigue: yes Feelings of worthlessness or guilt: no Impaired concentration/indecisiveness: no Suicidal ideations: no Hopelessness: no Crying spells: yes Depression screen [facility] 2/9 08/04/2017 04/28/2017  Decreased Interest 1 1  Down, Depressed, Hopeless 2 3  PHQ - 2 Score 3 4  Altered sleeping 1 3  Tired, decreased energy 1 3  Change in appetite 0 2  Feeling bad or failure about yourself  0 3  Trouble concentrating 0 3  Moving slowly or fidgety/restless 0 2  Suicidal thoughts 0 1  PHQ-9 Score 5 21   GAD 7 : Generalized Anxiety Score 08/04/2017 04/28/2017  Nervous, Anxious, on Edge 3 2  Control/stop worrying 3 3  Worry too much - different things 3 3  Trouble relaxing 3 1  Restless 3 1  Easily annoyed or irritable 3 3  Afraid - awful might happen 3 2  Total GAD 7 Score 21 15  Anxiety Difficulty - Very difficult    Relevant past medical, surgical, family and social history reviewed and updated as indicated. Interim medical history since our last visit reviewed. Allergies and medications reviewed and updated.  Review of Systems    Constitutional: Negative.   Respiratory: Negative.   Cardiovascular: Negative.   Neurological: Negative.   Psychiatric/Behavioral: Positive for dysphoric mood. Negative for agitation, behavioral problems, confusion, decreased concentration, hallucinations, self-injury, sleep disturbance and suicidal ideas. The patient is nervous/anxious. The patient is not hyperactive.     Per HPI unless specifically indicated above     Objective:    BP 103/70 (BP Location: Left Arm, Patient Position: Sitting, Cuff Size: Normal)   Pulse 93   Temp 98.2 F (36.8 C)   Wt 131 lb (59.4 kg)   SpO2 99%   BMI 22.49 kg/m   Wt Readings from Last 3 Encounters:  08/04/17 131 lb (59.4 kg)  07/15/17 131 lb (59.4 kg)  06/09/17 134 lb 6.4 oz (61 kg)    Physical Exam  Constitutional: She is oriented to person, place, and time. She appears well-developed and well-nourished. No distress.  HENT:  Head: Normocephalic and atraumatic.  Right Ear: Hearing normal.  Left Ear: Hearing normal.  Nose: Nose normal.  Eyes: Conjunctivae and lids are normal. Right eye exhibits no discharge. Left eye exhibits no discharge. No scleral icterus.  Cardiovascular: Normal rate, regular rhythm, normal heart sounds and intact distal pulses. Exam reveals no gallop and no friction rub.  No murmur heard. Pulmonary/Chest: Effort normal and breath sounds normal. No stridor. No respiratory distress. She has no wheezes.  Musculoskeletal: Normal range of motion.  Neurological: She is alert and oriented to person,  place, and time.  Skin: Skin is warm, dry and intact. Capillary refill takes less than 2 seconds. No rash noted. She is not diaphoretic. No erythema. No pallor.  Psychiatric: Her speech is normal and behavior is normal. Judgment and thought content normal. Her mood appears anxious. Cognition and memory are normal. She exhibits a depressed mood.    Results for orders placed or performed in visit on 07/15/17  GC/Chlamydia Probe  Amp  Result Value Ref Range   Chlamydia trachomatis, NAA Negative Negative   Neisseria gonorrhoeae by PCR Negative Negative      Assessment & Plan:   Problem List Items Addressed This Visit      Other   Depression, major, single episode, severe (HCC) - Primary    Has not been taking her sertraline. Will start her back on her sertraline and start hydroxyzine for anxiety attacks. Recheck 1 month. Call with any concerns .      Relevant Medications   sertraline (ZOLOFT) 100 MG tablet   hydrOXYzine (ATARAX/VISTARIL) 25 MG tablet    Other Visit Diagnoses    Vitamin D deficiency       Rechecking levels today. Will treat as needed.    Relevant Orders   VITAMIN D 25 Hydroxy (Vit-D Deficiency, Fractures)       Follow up plan: Return in about 1 month (around 09/01/2017) for follow up mood.

## 2017-08-04 NOTE — Progress Notes (Deleted)
   There were no vitals taken for this visit.   Subjective:    Patient ID: Brittany Page, female    DOB: Sep 25, 1990, 27 y.o.   MRN: [id number]  HPI: Brittany Page is a 27 y.o. female  No chief complaint on file.  Finished her vitamin D- needs recheck   DEPRESSION Mood status: {Blank single:19197::"controlled","uncontrolled","better","worse","exacerbated","stable"} Satisfied with current treatment?: {Blank single:19197::"yes","no"} Symptom severity: {Blank single:19197::"mild","moderate","severe"}  Duration of current treatment : {Blank single:19197::"chronic","months","years"} Side effects: {Blank single:19197::"yes","no"} Medication compliance: {Blank single:19197::"excellent compliance","good compliance","fair compliance","poor compliance"} Psychotherapy/counseling: {Blank single:19197::"yes","no"} {Blank single:19197::"current","in the past"} Previous psychiatric medications: {Blank multiple:19196::"abilify","amitryptiline","buspar","celexa","cymbalta","depakote","effexor","lamictal","lexapro","lithium","nortryptiline","paxil","prozac","pristiq (desvenlafaxine","seroquel","wellbutrin","zoloft","zyprexa"} Depressed mood: {Blank single:19197::"yes","no"} Anxious mood: {Blank single:19197::"yes","no"} Anhedonia: {Blank single:19197::"yes","no"} Significant weight loss or gain: {Blank single:19197::"yes","no"} Insomnia: {Blank single:19197::"yes","no"} {Blank single:19197::"hard to fall asleep","hard to stay asleep"} Fatigue: {Blank single:19197::"yes","no"} Feelings of worthlessness or guilt: {Blank single:19197::"yes","no"} Impaired concentration/indecisiveness: {Blank single:19197::"yes","no"} Suicidal ideations: {Blank single:19197::"yes","no"} Hopelessness: {Blank single:19197::"yes","no"} Crying spells: {Blank single:19197::"yes","no"} Depression screen PHQ 2/9 04/28/2017  Decreased Interest 1  Down, Depressed, Hopeless 3  PHQ - 2 Score 4  Altered sleeping 3  Tired, decreased  energy 3  Change in appetite 2  Feeling bad or failure about yourself  3  Trouble concentrating 3  Moving slowly or fidgety/restless 2  Suicidal thoughts 1  PHQ-9 Score 21    Relevant past medical, surgical, family and social history reviewed and updated as indicated. Interim medical history since our last visit reviewed. Allergies and medications reviewed and updated.  Review of Systems  Per HPI unless specifically indicated above     Objective:    There were no vitals taken for this visit.  Wt Readings from Last 3 Encounters:  07/15/17 131 lb (59.4 kg)  06/09/17 134 lb 6.4 oz (61 kg)  05/12/17 134 lb 8 oz (61 kg)    Physical Exam  Results for orders placed or performed in visit on 07/15/17  GC/Chlamydia Probe Amp  Result Value Ref Range   Chlamydia trachomatis, NAA Negative Negative   Neisseria gonorrhoeae by PCR Negative Negative      Assessment & Plan:   Problem List Items Addressed This Visit    None    Visit Diagnoses    Vitamin D deficiency    -  Primary       Follow up plan: No follow-ups on file.

## 2017-08-04 NOTE — Assessment & Plan Note (Signed)
Has not been taking her sertraline. Will start her back on her sertraline and start hydroxyzine for anxiety attacks. Recheck 1 month. Call with any concerns .

## 2017-08-05 LAB — VITAMIN D 25 HYDROXY (VIT D DEFICIENCY, FRACTURES): Vit D, 25-Hydroxy: 47.2 ng/mL (ref 30.0–100.0)

## 2017-09-01 ENCOUNTER — Ambulatory Visit (INDEPENDENT_AMBULATORY_CARE_PROVIDER_SITE_OTHER): Payer: 59 | Admitting: Family Medicine

## 2017-09-01 ENCOUNTER — Encounter: Payer: Self-pay | Admitting: Family Medicine

## 2017-09-01 VITALS — BP 131/84 | HR 81 | Temp 98.5°F | Wt 133.2 lb

## 2017-09-01 DIAGNOSIS — F322 Major depressive disorder, single episode, severe without psychotic features: Secondary | ICD-10-CM

## 2017-09-01 MED ORDER — HYDROXYZINE HCL 25 MG PO TABS: 25.0000 mg | ORAL_TABLET | Freq: Three times a day (TID) | ORAL | 3 refills | Status: DC | PRN

## 2017-09-01 MED ORDER — SERTRALINE HCL 100 MG PO TABS: 150.0000 mg | ORAL_TABLET | Freq: Every day | ORAL | 3 refills | Status: DC

## 2017-09-01 NOTE — Progress Notes (Signed)
BP 131/84 (BP Location: Left Arm, Patient Position: Sitting, Cuff Size: Normal)   Pulse 81   Temp 98.5 F (36.9 C)   Wt 133 lb 3 oz (60.4 kg)   SpO2 100%   BMI 22.86 kg/m    Subjective:    Patient ID: Brittany Page, female    DOB: 11/20/1990, 27 y.o.   MRN: [id number]  HPI: Brittany GooMarissa Mattern is a 27 y.o. female  Chief Complaint  Patient presents with  . Anxiety  . Depression   DEPRESSION Mood status: stable Satisfied with current treatment?: no Symptom severity: moderate  Duration of current treatment : months Side effects: no Medication compliance: good compliance Psychotherapy/counseling: no  Previous psychiatric medications: sertraline Depressed mood: yes Anxious mood: yes Anhedonia: no Significant weight loss or gain: no Insomnia: no  Fatigue: yes Feelings of worthlessness or guilt: yes Impaired concentration/indecisiveness: yes Suicidal ideations: no Hopelessness: no Crying spells: no [facility] 09/01/2017 08/04/2017 04/28/2017  Decreased Interest 1 1 1   Down, Depressed, Hopeless 1 2 3   PHQ - 2 Score 2 3 4   Altered sleeping 0 1 3  Tired, decreased energy 1 1 3   Change in appetite 1 0 2  Feeling bad or failure about yourself  2 0 3  Trouble concentrating 1 0 3  Moving slowly or fidgety/restless 0 0 2  Suicidal thoughts 0 0 1  PHQ-9 Score 7 5 21   Difficult doing work/chores Somewhat difficult - -   GAD 7 : Generalized Anxiety Score 09/01/2017 08/04/2017 04/28/2017  Nervous, Anxious, on Edge 3 3 2   Control/stop worrying 3 3 3   Worry too much - different things 3 3 3   Trouble relaxing 1 3 1   Restless 0 3 1  Easily annoyed or irritable 1 3 3   Afraid - awful might happen 0 3 2  Total GAD 7 Score 11 21 15   Anxiety Difficulty Somewhat difficult - Very difficult    Relevant past medical, surgical, family and social history reviewed and updated as indicated. Interim medical history since our last visit reviewed. Allergies and medications reviewed and  updated.  Review of Systems  Constitutional: Negative.   Respiratory: Negative.   Cardiovascular: Negative.   Psychiatric/Behavioral: Positive for dysphoric mood. Negative for agitation, behavioral problems, confusion, decreased concentration, hallucinations, self-injury, sleep disturbance and suicidal ideas. The patient is nervous/anxious. The patient is not hyperactive.     Per HPI unless specifically indicated above     Objective:    BP 131/84 (BP Location: Left Arm, Patient Position: Sitting, Cuff Size: Normal)   Pulse 81   Temp 98.5 F (36.9 C)   Wt 133 lb 3 oz (60.4 kg)   SpO2 100%   BMI 22.86 kg/m   Wt Readings from Last 3 Encounters:  09/01/17 133 lb 3 oz (60.4 kg)  08/04/17 131 lb (59.4 kg)  07/15/17 131 lb (59.4 kg)    Physical Exam  Constitutional: She is oriented to person, place, and time. She appears well-developed and well-nourished. No distress.  HENT:  Head: Normocephalic and atraumatic.  Right Ear: Hearing normal.  Left Ear: Hearing normal.  Nose: Nose normal.  Eyes: Conjunctivae and lids are normal. Right eye exhibits no discharge. Left eye exhibits no discharge. No scleral icterus.  Cardiovascular: Normal rate, regular rhythm, normal heart sounds and intact distal pulses. Exam reveals no gallop and no friction rub.  No murmur heard. Pulmonary/Chest: Effort normal and breath sounds normal. No stridor. No respiratory distress. She has no wheezes. She has  no rales. She exhibits no tenderness.  Musculoskeletal: Normal range of motion.  Neurological: She is alert and oriented to person, place, and time.  Skin: Skin is warm, dry and intact. Capillary refill takes less than 2 seconds. No rash noted. She is not diaphoretic. No erythema. No pallor.  Psychiatric: She has a normal mood and affect. Her speech is normal and behavior is normal. Judgment and thought content normal. Cognition and memory are normal.  Nursing note and vitals reviewed.   Results for  orders placed or performed in visit on 08/04/17  VITAMIN D 25 Hydroxy (Vit-D Deficiency, Fractures)  Result Value Ref Range   Vit D, 25-Hydroxy 47.2 30.0 - 100.0 ng/mL      Assessment & Plan:   Problem List Items Addressed This Visit      Other   Depression, major, single episode, severe (HCC) - Primary    Stable, but not there. Will increase her sertaline to 150mg  and recheck 4-6 weeks. Call with any concerns.       Relevant Medications   sertraline (ZOLOFT) 100 MG tablet   hydrOXYzine (ATARAX/VISTARIL) 25 MG tablet       Follow up plan: Return 4-6 weeks, for follow up.

## 2017-09-01 NOTE — Assessment & Plan Note (Signed)
Stable, but not there. Will increase her sertaline to 150mg  and recheck 4-6 weeks. Call with any concerns.

## 2017-09-09 ENCOUNTER — Encounter: Payer: Self-pay | Admitting: Family Medicine
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
## 2017-09-28 ENCOUNTER — Ambulatory Visit (INDEPENDENT_AMBULATORY_CARE_PROVIDER_SITE_OTHER): Payer: 59 | Admitting: Family Medicine

## 2017-09-28 ENCOUNTER — Encounter: Payer: Self-pay | Admitting: Family Medicine

## 2017-09-28 VITALS — BP 108/70 | HR 76 | Temp 98.1°F | Ht 64.0 in | Wt 135.9 lb

## 2017-09-28 DIAGNOSIS — N760 Acute vaginitis: Secondary | ICD-10-CM | POA: Diagnosis not present

## 2017-09-28 DIAGNOSIS — B9689 Other specified bacterial agents as the cause of diseases classified elsewhere: Secondary | ICD-10-CM

## 2017-09-28 MED ORDER — SECNIDAZOLE 2 G PO PACK: 1.0000 | PACK | Freq: Once | ORAL | 0 refills | Status: AC

## 2017-09-28 NOTE — Progress Notes (Signed)
BP 108/70   Pulse 76   Temp 98.1 F (36.7 C) (Oral)   Ht 5\' 4"  (1.626 m)   Wt 135 lb 14.4 oz (61.6 kg)   SpO2 98%   BMI 23.33 kg/m    Subjective:    Patient ID: Brittany Page, female    DOB: 01/15/1991, 27 y.o.   MRN: [id number]  HPI: Brittany Page is a 27 y.o. female  Chief Complaint  Patient presents with  . Vaginal Itching    pt states she has had itching and discharge for a couple of weeks   Pt here today for vaginal itching, discharge, and odor x 2 weeks. Now also having some dysuria as well. Discharge is thin and grey/white. Has not tried anything OTC for sxs. Treated for BV with secnidazole 3 months ago. Denies rashes, concern for STIs, N/V, abdominal pain, fevers.   Relevant past medical, surgical, family and social history reviewed and updated as indicated. Interim medical history since our last visit reviewed. Allergies and medications reviewed and updated.  Review of Systems  Per HPI unless specifically indicated above     Objective:    BP 108/70   Pulse 76   Temp 98.1 F (36.7 C) (Oral)   Ht 5\' 4"  (1.626 m)   Wt 135 lb 14.4 oz (61.6 kg)   SpO2 98%   BMI 23.33 kg/m   Wt Readings from Last 3 Encounters:  09/28/17 135 lb 14.4 oz (61.6 kg)  09/01/17 133 lb 3 oz (60.4 kg)  08/04/17 131 lb (59.4 kg)    Physical Exam  Constitutional: She is oriented to person, place, and time. She appears well-developed and well-nourished. No distress.  HENT:  Head: Atraumatic.  Eyes: Pupils are equal, round, and reactive to light. Conjunctivae are normal.  Neck: Normal range of motion. Neck supple.  Cardiovascular: Normal rate and regular rhythm.  Pulmonary/Chest: Effort normal. No respiratory distress.  Abdominal: Soft. Bowel sounds are normal. She exhibits no distension. There is no tenderness.  Genitourinary: Vaginal discharge (moderate thin grey discharge present) found.  Musculoskeletal: She exhibits no tenderness (No CVA tenderness b/l).  Neurological: She is  alert and oriented to person, place, and time.  Skin: Skin is warm and dry. No rash noted.  Psychiatric: She has a normal mood and affect. Her behavior is normal.  Nursing note and vitals reviewed.   Results for orders placed or performed in visit on 09/28/17  WET PREP FOR TRICH, YEAST, CLUE  Result Value Ref Range   Trichomonas Exam Negative Negative   Yeast Exam Negative Negative   Clue Cell Exam Positive (A) Negative  Microscopic Examination  Result Value Ref Range   WBC, UA 0-5 0 - 5 /hpf   RBC, UA 0-2 0 - 2 /hpf   Epithelial Cells (non renal) 0-10 0 - 10 /hpf   Mucus, UA Present Not Estab.   Bacteria, UA Few None seen/Few  UA/M w/rflx Culture, Routine  Result Value Ref Range   Specific Gravity, UA >1.030 (H) 1.005 - 1.030   pH, UA 6.0 5.0 - 7.5   Color, UA Orange Yellow   Appearance Ur Cloudy (A) Clear   Leukocytes, UA Negative Negative   Protein, UA 1+ (A) Negative/Trace   Glucose, UA Negative Negative   Ketones, UA Negative Negative   RBC, UA Negative Negative   Bilirubin, UA Negative Negative   Urobilinogen, Ur 0.2 0.2 - 1.0 mg/dL   Nitrite, UA Negative Negative   Microscopic Examination See below:  Assessment & Plan:   Problem List Items Addressed This Visit    None    Visit Diagnoses    BV (bacterial vaginosis)    -  Primary   Wet prep + for BV. Pt requesting secnidazole again rather than flagyl. Encouraged good hygiene practices, probiotics. F/u if no improvement   Relevant Orders   UA/M w/rflx Culture, Routine (Completed)   WET PREP FOR TRICH, YEAST, CLUE (Completed)       Follow up plan: Return if symptoms worsen or fail to improve.

## 2017-09-29 LAB — UA/M W/RFLX CULTURE, ROUTINE
BILIRUBIN UA: NEGATIVE
Glucose, UA: NEGATIVE
Ketones, UA: NEGATIVE
LEUKOCYTES UA: NEGATIVE
Nitrite, UA: NEGATIVE
PH UA: 6 (ref 5.0–7.5)
RBC UA: NEGATIVE
Specific Gravity, UA: >1.03 — ABNORMAL HIGH (ref 1.005–1.030)
UUROB: 0.2 mg/dL (ref 0.2–1.0)

## 2017-09-29 LAB — MICROSCOPIC EXAMINATION

## 2017-09-29 LAB — WET PREP FOR TRICH, YEAST, CLUE
CLUE CELL EXAM: POSITIVE — AB
TRICHOMONAS EXAM: NEGATIVE
Yeast Exam: NEGATIVE

## 2017-09-29 NOTE — Patient Instructions (Signed)
Follow up as needed

## 2017-10-06 ENCOUNTER — Ambulatory Visit: Payer: 59 | Admitting: Family Medicine

## 2017-10-08 ENCOUNTER — Encounter: Payer: Self-pay | Admitting: Family Medicine

## 2017-10-08 ENCOUNTER — Ambulatory Visit (INDEPENDENT_AMBULATORY_CARE_PROVIDER_SITE_OTHER): Payer: 59 | Admitting: Family Medicine

## 2017-10-08 VITALS — BP 112/72 | HR 99 | Temp 98.1°F | Wt 135.1 lb

## 2017-10-08 DIAGNOSIS — F322 Major depressive disorder, single episode, severe without psychotic features: Secondary | ICD-10-CM

## 2017-10-08 MED ORDER — SERTRALINE HCL 100 MG PO TABS: 200.0000 mg | ORAL_TABLET | Freq: Every day | ORAL | 3 refills | Status: DC

## 2017-10-08 MED ORDER — HYDROXYZINE HCL 25 MG PO TABS: 25.0000 mg | ORAL_TABLET | Freq: Three times a day (TID) | ORAL | 3 refills | Status: DC | PRN

## 2017-10-08 NOTE — Progress Notes (Signed)
BP 112/72 (BP Location: Left Arm, Patient Position: Sitting, Cuff Size: Normal)   Pulse 99   Temp 98.1 F (36.7 C)   Wt 135 lb 1 oz (61.3 kg)   SpO2 96%   BMI 23.18 kg/m    Subjective:    Patient ID: Brittany Page, female    DOB: 08/22/1990, 27 y.o.   MRN: [id number]  HPI: Brittany Page is a 27 y.o. female  Chief Complaint  Patient presents with  . Depression    FMLA   DEPRESSION- having issues with work. 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 She is not feeling good at all.  Mood status: stable Satisfied with current treatment?: no Symptom severity: moderate  Duration of current treatment : months Side effects: no Medication compliance: excellent compliance Psychotherapy/counseling: no  Previous psychiatric medications: zoloft Depressed mood: yes Anxious mood: yes Anhedonia: no Significant weight loss or gain: no Insomnia: no  Fatigue: no Feelings of worthlessness or guilt: no Impaired concentration/indecisiveness: no Suicidal ideations: no Hopelessness: no Crying spells: no [facility] 10/08/2017 09/01/2017 08/04/2017 04/28/2017  Decreased Interest 2 1 1 1   Down, Depressed, Hopeless 3 1 2 3   PHQ - 2 Score 5 2 3 4   Altered sleeping 1 0 1 3  Tired, decreased energy 2 1 1 3   Change in appetite 0 1 0 2  Feeling bad or failure about yourself  1 2 0 3  Trouble concentrating 2 1 0 3  Moving slowly or fidgety/restless 0 0 0 2  Suicidal thoughts 0 0 0 1  PHQ-9 Score 11 7 5 21   Difficult doing work/chores Very difficult Somewhat difficult - -   GAD 7 : Generalized Anxiety Score 10/08/2017 09/01/2017 08/04/2017 04/28/2017  Nervous, Anxious, on Edge 3 3 3 2   Control/stop worrying 3 3 3 3   Worry too much - different things 3 3 3 3   Trouble relaxing 3 1 3 1   Restless 0 0 3 1  Easily annoyed or irritable 2 1 3 3   Afraid - awful might happen 3 0 3 2  Total GAD 7 Score 17 11 21 15   Anxiety Difficulty Somewhat  difficult Somewhat difficult - Very difficult    Relevant past medical, surgical, family and social history reviewed and updated as indicated. Interim medical history since our last visit reviewed. Allergies and medications reviewed and updated.  Review of Systems  Constitutional: Negative.   Respiratory: Negative.   Cardiovascular: Negative.   Musculoskeletal: Negative.   Neurological: Negative.   Psychiatric/Behavioral: Positive for dysphoric mood. Negative for agitation, behavioral problems, confusion, decreased concentration, hallucinations, self-injury, sleep disturbance and suicidal ideas. The patient is nervous/anxious. The patient is not hyperactive.     Per HPI unless specifically indicated above     Objective:    BP 112/72 (BP Location: Left Arm, Patient Position: Sitting, Cuff Size: Normal)   Pulse 99   Temp 98.1 F (36.7 C)   Wt 135 lb 1 oz (61.3 kg)   SpO2 96%   BMI 23.18 kg/m   Wt Readings from Last 3 Encounters:  10/08/17 135 lb 1 oz (61.3 kg)  09/28/17 135 lb 14.4 oz (61.6 kg)  09/01/17 133 lb 3 oz (60.4 kg)    Physical Exam  Constitutional: She is oriented to person, place, and time. She appears well-developed and well-nourished. No distress.  HENT:  Head: Normocephalic and atraumatic.  Right Ear: Hearing normal.  Left Ear: Hearing  normal.  Nose: Nose normal.  Eyes: Conjunctivae and lids are normal. Right eye exhibits no discharge. Left eye exhibits no discharge. No scleral icterus.  Cardiovascular: Normal rate, regular rhythm, normal heart sounds and intact distal pulses. Exam reveals no gallop and no friction rub.  No murmur heard. Pulmonary/Chest: Effort normal and breath sounds normal. No stridor. No respiratory distress. She has no wheezes. She has no rales. She exhibits no tenderness.  Musculoskeletal: Normal range of motion.  Neurological: She is alert and oriented to person, place, and time.  Skin: Skin is warm, dry and intact. Capillary refill  takes less than 2 seconds. No rash noted. She is not diaphoretic. No erythema. No pallor.  Psychiatric: She has a normal mood and affect. Her speech is normal and behavior is normal. Judgment and thought content normal. Cognition and memory are normal.  Nursing note and vitals reviewed.   Results for orders placed or performed in visit on 09/28/17  WET PREP FOR TRICH, YEAST, CLUE  Result Value Ref Range   Trichomonas Exam Negative Negative   Yeast Exam Negative Negative   Clue Cell Exam Positive (A) Negative  Microscopic Examination  Result Value Ref Range   WBC, UA 0-5 0 - 5 /hpf   RBC, UA 0-2 0 - 2 /hpf   Epithelial Cells (non renal) 0-10 0 - 10 /hpf   Mucus, UA Present Not Estab.   Bacteria, UA Few None seen/Few  UA/M w/rflx Culture, Routine  Result Value Ref Range   Specific Gravity, UA >1.030 (H) 1.005 - 1.030   pH, UA 6.0 5.0 - 7.5   Color, UA Orange Yellow   Appearance Ur Cloudy (A) Clear   Leukocytes, UA Negative Negative   Protein, UA 1+ (A) Negative/Trace   Glucose, UA Negative Negative   Ketones, UA Negative Negative   RBC, UA Negative Negative   Bilirubin, UA Negative Negative   Urobilinogen, Ur 0.2 0.2 - 1.0 mg/dL   Nitrite, UA Negative Negative   Microscopic Examination See below:       Assessment & Plan:   Problem List Items Addressed This Visit      Other   Depression, major, single episode, severe (HCC) - Primary    Not under good control. Will increase to 200mg  on sertraline and put on FMLA- scanned document in the computer. Call with any concerns.        Relevant Medications   sertraline (ZOLOFT) 100 MG tablet   hydrOXYzine (ATARAX/VISTARIL) 25 MG tablet       Follow up plan: Return in about 1 month (around 11/05/2017) for Follow up depression.

## 2017-10-08 NOTE — Assessment & Plan Note (Signed)
Not under good control. Will increase to 200mg  on sertraline and put on FMLA- scanned document in the computer. Call with any concerns.

## 2017-10-09 ENCOUNTER — Ambulatory Visit: Payer: 59 | Admitting: Family Medicine

## 2017-11-17 ENCOUNTER — Ambulatory Visit (INDEPENDENT_AMBULATORY_CARE_PROVIDER_SITE_OTHER): Payer: 59 | Admitting: Family Medicine

## 2017-11-17 ENCOUNTER — Other Ambulatory Visit: Payer: Self-pay

## 2017-11-17 ENCOUNTER — Encounter: Payer: Self-pay | Admitting: Family Medicine

## 2017-11-17 VITALS — BP 111/69 | HR 73 | Temp 98.2°F | Ht 64.0 in | Wt 133.0 lb

## 2017-11-17 DIAGNOSIS — F322 Major depressive disorder, single episode, severe without psychotic features: Secondary | ICD-10-CM | POA: Diagnosis not present

## 2017-11-17 MED ORDER — BUPROPION HCL ER (SR) 150 MG PO TB12: ORAL_TABLET | ORAL | 3 refills | Status: DC

## 2017-11-17 NOTE — Assessment & Plan Note (Signed)
Not doing great. Numbers improving, but not feeling significantly better. Will continue zoloft and add wellbutrin and recheck 2 weeks. Call with any concerns.

## 2017-11-17 NOTE — Progress Notes (Signed)
BP 111/69   Pulse 73   Temp 98.2 F (36.8 C) (Oral)   Ht 5\' 4"  (1.626 m)   Wt 133 lb (60.3 kg)   SpO2 97%   BMI 22.83 kg/m    Subjective:    Patient ID: Brittany Page, female    DOB: Oct 16, 1990, 28 y.o.   MRN: [id number]  HPI: Brittany Page is a 27 y.o. female  Chief Complaint  Patient presents with  . Depression    pt states medication is not helping   DEPRESSION- Not feeling particularly better. She notes that she is feeling more anxious than depressed. She has a lot of social stressors going on right now.  Mood status: uncontrolled Satisfied with current treatment?: no Symptom severity: moderate  Duration of current treatment : chronic Side effects: no Medication compliance: good compliance Psychotherapy/counseling: no  Previous psychiatric medications: zoloft Depressed mood: yes Anxious mood: yes Anhedonia: no Significant weight loss or gain: no Insomnia: no  Fatigue: yes Feelings of worthlessness or guilt: yes Impaired concentration/indecisiveness: yes Suicidal ideations: no Hopelessness: yes Crying spells: yes Depression screen [facility] 2/9 11/17/2017 10/08/2017 09/01/2017 08/04/2017 04/28/2017  Decreased Interest 1 2 1 1 1   Down, Depressed, Hopeless 1 3 1 2 3   PHQ - 2 Score 2 5 2 3 4   Altered sleeping 1 1 0 1 3  Tired, decreased energy 2 2 1 1 3   Change in appetite 1 0 1 0 2  Feeling bad or failure about yourself  2 1 2  0 3  Trouble concentrating 2 2 1  0 3  Moving slowly or fidgety/restless 0 0 0 0 2  Suicidal thoughts 0 0 0 0 1  PHQ-9 Score 10 11 7 5 21   Difficult doing work/chores Very difficult Very difficult Somewhat difficult - -   GAD 7 : Generalized Anxiety Score 11/17/2017 10/08/2017 09/01/2017 08/04/2017  Nervous, Anxious, on Edge 2 3 3 3   Control/stop worrying 2 3 3 3   Worry too much - different things 2 3 3 3   Trouble relaxing 2 3 1 3   Restless 2 0 0 3  Easily annoyed or irritable 2 2 1 3   Afraid - awful might happen 2 3 0 3  Total GAD 7 Score 14 17  11 21   Anxiety Difficulty Somewhat difficult Somewhat difficult Somewhat difficult -   Relevant past medical, surgical, family and social history reviewed and updated as indicated. Interim medical history since our last visit reviewed. Allergies and medications reviewed and updated.  Review of Systems  Constitutional: Negative.   Respiratory: Negative.   Cardiovascular: Negative.   Skin: Negative.   Neurological: Negative.   Psychiatric/Behavioral: Positive for agitation and dysphoric mood. Negative for behavioral problems, confusion, decreased concentration, hallucinations, self-injury, sleep disturbance and suicidal ideas. The patient is nervous/anxious. The patient is not hyperactive.     Per HPI unless specifically indicated above     Objective:    BP 111/69   Pulse 73   Temp 98.2 F (36.8 C) (Oral)   Ht 5\' 4"  (1.626 m)   Wt 133 lb (60.3 kg)   SpO2 97%   BMI 22.83 kg/m   Wt Readings from Last 3 Encounters:  11/17/17 133 lb (60.3 kg)  10/08/17 135 lb 1 oz (61.3 kg)  09/28/17 135 lb 14.4 oz (61.6 kg)    Physical Exam  Constitutional: She is oriented to person, place, and time. She appears well-developed and well-nourished. No distress.  HENT:  Head: Normocephalic and atraumatic.  Right Ear: Hearing  normal.  Left Ear: Hearing normal.  Nose: Nose normal.  Eyes: Conjunctivae and lids are normal. Right eye exhibits no discharge. Left eye exhibits no discharge. No scleral icterus.  Cardiovascular: Normal rate, regular rhythm, normal heart sounds and intact distal pulses. Exam reveals no gallop and no friction rub.  No murmur heard. Pulmonary/Chest: Effort normal and breath sounds normal. No stridor. No respiratory distress. She has no wheezes. She has no rales. She exhibits no tenderness.  Musculoskeletal: Normal range of motion.  Neurological: She is alert and oriented to person, place, and time.  Skin: Skin is warm, dry and intact. Capillary refill takes less than 2  seconds. No rash noted. She is not diaphoretic. No erythema. No pallor.  Psychiatric: She has a normal mood and affect. Her speech is normal and behavior is normal. Judgment and thought content normal. Cognition and memory are normal.  Nursing note and vitals reviewed.   Results for orders placed or performed in visit on 09/28/17  WET PREP FOR TRICH, YEAST, CLUE  Result Value Ref Range   Trichomonas Exam Negative Negative   Yeast Exam Negative Negative   Clue Cell Exam Positive (A) Negative  Microscopic Examination  Result Value Ref Range   WBC, UA 0-5 0 - 5 /hpf   RBC, UA 0-2 0 - 2 /hpf   Epithelial Cells (non renal) 0-10 0 - 10 /hpf   Mucus, UA Present Not Estab.   Bacteria, UA Few None seen/Few  UA/M w/rflx Culture, Routine  Result Value Ref Range   Specific Gravity, UA >1.030 (H) 1.005 - 1.030   pH, UA 6.0 5.0 - 7.5   Color, UA Orange Yellow   Appearance Ur Cloudy (A) Clear   Leukocytes, UA Negative Negative   Protein, UA 1+ (A) Negative/Trace   Glucose, UA Negative Negative   Ketones, UA Negative Negative   RBC, UA Negative Negative   Bilirubin, UA Negative Negative   Urobilinogen, Ur 0.2 0.2 - 1.0 mg/dL   Nitrite, UA Negative Negative   Microscopic Examination See below:       Assessment & Plan:   Problem List Items Addressed This Visit      Other   Depression, major, single episode, severe (HCC) - Primary    Not doing great. Numbers improving, but not feeling significantly better. Will continue zoloft and add wellbutrin and recheck 2 weeks. Call with any concerns.       Relevant Medications   buPROPion (WELLBUTRIN SR) 150 MG 12 hr tablet       Follow up plan: Return in about 2 weeks (around 12/01/2017) for follow up mood.

## 2017-11-25 ENCOUNTER — Ambulatory Visit (INDEPENDENT_AMBULATORY_CARE_PROVIDER_SITE_OTHER): Payer: 59 | Admitting: Physician Assistant

## 2017-11-25 ENCOUNTER — Encounter: Payer: Self-pay | Admitting: Physician Assistant

## 2017-11-25 VITALS — BP 106/72 | HR 64 | Temp 98.4°F | Ht 64.0 in | Wt 135.0 lb

## 2017-11-25 DIAGNOSIS — K645 Perianal venous thrombosis: Secondary | ICD-10-CM

## 2017-11-25 MED ORDER — HYDROCORTISONE 2.5 % RE CREA: 1.0000 "application " | TOPICAL_CREAM | Freq: Two times a day (BID) | RECTAL | 0 refills | Status: DC

## 2017-11-25 NOTE — Progress Notes (Signed)
Subjective:    Patient ID: Brittany Page, female    DOB: Aug 19, 1990, 27 y.o.   MRN: [id number]  Brittany Page is a 27 y.o. female presenting on 11/25/2017 for Constipation (pt states she has been constipated since starting the Wellbutrin )   HPI   Reports constipation for 10 days since starting Wellbutrin, has only gone twice. Normally goes every day. Eats sausage egg and cheese mcmuffin from [employer]. Lunch is burger or hot dog from restaurant. Eats dinner peanut butter and jelly.   Social History   Tobacco Use  . Smoking status: Never Smoker  . Smokeless tobacco: Never Used  Substance Use Topics  . Alcohol use: Yes    Alcohol/week: 0.0 standard drinks    Comment: occas  . Drug use: No    Review of Systems Per HPI unless specifically indicated above     Objective:    BP 106/72   Pulse 64   Temp 98.4 F (36.9 C) (Oral)   Ht 5\' 4"  (1.626 m)   Wt 135 lb (61.2 kg)   SpO2 98%   BMI 23.17 kg/m   Wt Readings from Last 3 Encounters:  11/25/17 135 lb (61.2 kg)  11/17/17 133 lb (60.3 kg)  10/08/17 135 lb 1 oz (61.3 kg)    Physical Exam  Constitutional: She is oriented to person, place, and time. She appears well-developed and well-nourished.  Cardiovascular: Normal rate and regular rhythm.  Pulmonary/Chest: Effort normal and breath sounds normal.  Genitourinary: Rectal exam shows external hemorrhoid.  Genitourinary Comments: Thrombosed external hemorrhoid presence. No signs of purulence.   Neurological: She is alert and oriented to person, place, and time.  Skin: Skin is warm and dry.  Psychiatric: She has a normal mood and affect. Her behavior is normal.   Results for orders placed or performed in visit on 09/28/17  WET PREP FOR TRICH, YEAST, CLUE  Result Value Ref Range   Trichomonas Exam Negative Negative   Yeast Exam Negative Negative   Clue Cell Exam Positive (A) Negative  Microscopic Examination  Result Value Ref Range   WBC, UA 0-5 0 - 5 /hpf   RBC, UA 0-2  0 - 2 /hpf   Epithelial Cells (non renal) 0-10 0 - 10 /hpf   Mucus, UA Present Not Estab.   Bacteria, UA Few None seen/Few  UA/M w/rflx Culture, Routine  Result Value Ref Range   Specific Gravity, UA >1.030 (H) 1.005 - 1.030   pH, UA 6.0 5.0 - 7.5   Color, UA Orange Yellow   Appearance Ur Cloudy (A) Clear   Leukocytes, UA Negative Negative   Protein, UA 1+ (A) Negative/Trace   Glucose, UA Negative Negative   Ketones, UA Negative Negative   RBC, UA Negative Negative   Bilirubin, UA Negative Negative   Urobilinogen, Ur 0.2 0.2 - 1.0 mg/dL   Nitrite, UA Negative Negative   Microscopic Examination See below:       Assessment & Plan:  1. Thrombosed external hemorrhoid  She has had symptoms for 4-5 days, likely minimal benefit of excising this. Will give cream as below, do warm sitz baths. Encouraged increasing fiber in her diet with fruits and vegetables as her current diet is very fiber deficient. Can also do capful of miralax daily and increase fluids.  - hydrocortisone (ANUSOL-HC) 2.5 % rectal cream; Place 1 application rectally 2 (two) times daily.  Dispense: 30 g; Refill: 0    Follow up plan: Return if symptoms worsen or fail to  improve.  Luna Glasgow [facility] 11/25/2017, 2:34 PM

## 2017-11-25 NOTE — Patient Instructions (Signed)
Hemorrhoids    Hemorrhoids are swollen veins in and around the rectum or anus. Hemorrhoids can cause pain, itching, or bleeding. Most of the time, they do not cause serious problems. They usually get better with diet changes, lifestyle changes, and other home treatments.  Follow these instructions at home:  Eating and drinking  · Eat foods that have fiber, such as whole grains, beans, nuts, fruits, and vegetables. Ask your doctor about taking products that have added fiber (fiber supplements).  · Drink enough fluid to keep your pee (urine) clear or pale yellow.  For Pain and Swelling  · Take a warm-water bath (sitz bath) for 20 minutes to ease pain. Do this 3-4 times a day.  · If directed, put ice on the painful area. It may be helpful to use ice between your warm baths.  ¨ Put ice in a plastic bag.  ¨ Place a towel between your skin and the bag.  ¨ Leave the ice on for 20 minutes, 2-3 times a day.  General instructions  · Take over-the-counter and prescription medicines only as told by your doctor.  ¨ Medicated creams and medicines that are inserted into the anus (suppositories) may be used or applied as told.  · Exercise often.  · Go to the bathroom when you have the urge to poop (to have a bowel movement). Do not wait.  · Avoid pushing too hard (straining) when you poop.  · Keep the butt area dry and clean. Use wet toilet paper or moist paper towels.  · Do not sit on the toilet for a long time.  Contact a doctor if:  · You have any of these:  ¨ Pain and swelling that do not get better with treatment or medicine.  ¨ Bleeding that will not stop.  ¨ Trouble pooping or you cannot poop.  ¨ Pain or swelling outside the area of the hemorrhoids.  This information is not intended to replace advice given to you by your health care provider. Make sure you discuss any questions you have with your health care provider.  Document Released: 12/18/2007 Document Revised: 08/16/2015 Document Reviewed: 11/22/2014  Elsevier  Interactive Patient Education © 2018 Elsevier Inc.   

## 2017-12-04 ENCOUNTER — Ambulatory Visit (INDEPENDENT_AMBULATORY_CARE_PROVIDER_SITE_OTHER): Payer: 59 | Admitting: Family Medicine

## 2017-12-04 ENCOUNTER — Encounter: Payer: Self-pay | Admitting: Family Medicine

## 2017-12-04 ENCOUNTER — Other Ambulatory Visit: Payer: Self-pay

## 2017-12-04 VITALS — BP 110/71 | HR 78 | Temp 98.2°F | Ht 64.0 in | Wt 134.0 lb

## 2017-12-04 DIAGNOSIS — F322 Major depressive disorder, single episode, severe without psychotic features: Secondary | ICD-10-CM

## 2017-12-04 DIAGNOSIS — K645 Perianal venous thrombosis: Secondary | ICD-10-CM

## 2017-12-04 MED ORDER — SERTRALINE HCL 100 MG PO TABS: 200.0000 mg | ORAL_TABLET | Freq: Every day | ORAL | 1 refills | Status: DC

## 2017-12-04 MED ORDER — HYDROXYZINE HCL 25 MG PO TABS: 25.0000 mg | ORAL_TABLET | Freq: Three times a day (TID) | ORAL | 1 refills | Status: DC | PRN

## 2017-12-04 MED ORDER — BUPROPION HCL ER (SR) 150 MG PO TB12: 150.0000 mg | ORAL_TABLET | Freq: Two times a day (BID) | ORAL | 1 refills | Status: DC

## 2017-12-04 NOTE — Assessment & Plan Note (Signed)
Doing much better! Continue current regimen. Continue to monitor. Recheck 6 months.

## 2017-12-04 NOTE — Progress Notes (Signed)
BP 110/71   Pulse 78   Temp 98.2 F (36.8 C) (Oral)   Ht 5\' 4"  (1.626 m)   Wt 134 lb (60.8 kg)   SpO2 98%   BMI 23.00 kg/m    Subjective:    Patient ID: Brittany Page, female    DOB: [birth year], 27 y.o.   MRN: [id number]  HPI: Brittany Page is a 27 y.o. female  Chief Complaint  Patient presents with  . Constipation    2 w f/u pt states she is feeling better  . Depression   Had a hemorrhoid 2 weeks ago- saw Ricki Rodriguez for it. Was given anusol. Now feeling better. Better. Stools normal.   DEPRESSION Mood status: better Satisfied with current treatment?: yes Symptom severity: mild  Duration of current treatment : chronic Side effects: no Medication compliance: excellent compliance Psychotherapy/counseling: no  Previous psychiatric medications: wellbutrin, zoloft, hydroxyzine Depressed mood: no Anxious mood: no Anhedonia: no Significant weight loss or gain: no Insomnia: no  Fatigue: yes Feelings of worthlessness or guilt: no Impaired concentration/indecisiveness: no Suicidal ideations: no Hopelessness: no Crying spells: no Depression screen [facility] 2/9 12/04/2017 11/17/2017 10/08/2017 09/01/2017 08/04/2017  Decreased Interest 0 1 2 1 1   Down, Depressed, Hopeless 0 1 3 1 2   PHQ - 2 Score 0 2 5 2 3   Altered sleeping 0 1 1 0 1  Tired, decreased energy 1 2 2 1 1   Change in appetite 0 1 0 1 0  Feeling bad or failure about yourself  0 2 1 2  0  Trouble concentrating 0 2 2 1  0  Moving slowly or fidgety/restless 0 0 0 0 0  Suicidal thoughts 0 0 0 0 0  PHQ-9 Score 1 10 11 7 5   Difficult doing work/chores Not difficult at all Very difficult Very difficult Somewhat difficult -   GAD 7 : Generalized Anxiety Score 11/17/2017 10/08/2017 09/01/2017 08/04/2017  Nervous, Anxious, on Edge 2 3 3 3   Control/stop worrying 2 3 3 3   Worry too much - different things 2 3 3 3   Trouble relaxing 2 3 1 3   Restless 2 0 0 3  Easily annoyed or irritable 2 2 1 3   Afraid - awful might happen 2 3 0 3    Total GAD 7 Score 14 17 11 21   Anxiety Difficulty Somewhat difficult Somewhat difficult Somewhat difficult -    Relevant past medical, surgical, family and social history reviewed and updated as indicated. Interim medical history since our last visit reviewed. Allergies and medications reviewed and updated.  Review of Systems  Constitutional: Negative.   Respiratory: Negative.   Cardiovascular: Negative.   Gastrointestinal: Negative.   Genitourinary: Negative.   Psychiatric/Behavioral: Negative.     Per HPI unless specifically indicated above     Objective:    BP 110/71   Pulse 78   Temp 98.2 F (36.8 C) (Oral)   Ht 5\' 4"  (1.626 m)   Wt 134 lb (60.8 kg)   SpO2 98%   BMI 23.00 kg/m   Wt Readings from Last 3 Encounters:  12/04/17 134 lb (60.8 kg)  11/25/17 135 lb (61.2 kg)  11/17/17 133 lb (60.3 kg)    Physical Exam  Constitutional: She is oriented to person, place, and time. She appears well-developed and well-nourished. No distress.  HENT:  Head: Normocephalic and atraumatic.  Right Ear: Hearing normal.  Left Ear: Hearing normal.  Nose: Nose normal.  Eyes: Conjunctivae and lids are normal. Right eye exhibits no discharge. Left eye  exhibits no discharge. No scleral icterus.  Cardiovascular: Normal rate, regular rhythm, normal heart sounds and intact distal pulses. Exam reveals no gallop and no friction rub.  No murmur heard. Pulmonary/Chest: Effort normal and breath sounds normal. No stridor. No respiratory distress. She has no wheezes. She has no rales. She exhibits no tenderness.  Musculoskeletal: Normal range of motion.  Neurological: She is alert and oriented to person, place, and time.  Skin: Skin is warm, dry and intact. Capillary refill takes less than 2 seconds. No rash noted. She is not diaphoretic. No erythema. No pallor.  Psychiatric: She has a normal mood and affect. Her speech is normal and behavior is normal. Judgment and thought content normal.  Cognition and memory are normal.    Results for orders placed or performed in visit on 09/28/17  WET PREP FOR TRICH, YEAST, CLUE  Result Value Ref Range   Trichomonas Exam Negative Negative   Yeast Exam Negative Negative   Clue Cell Exam Positive (A) Negative  Microscopic Examination  Result Value Ref Range   WBC, UA 0-5 0 - 5 /hpf   RBC, UA 0-2 0 - 2 /hpf   Epithelial Cells (non renal) 0-10 0 - 10 /hpf   Mucus, UA Present Not Estab.   Bacteria, UA Few None seen/Few  UA/M w/rflx Culture, Routine  Result Value Ref Range   Specific Gravity, UA >1.030 (H) 1.005 - 1.030   pH, UA 6.0 5.0 - 7.5   Color, UA Orange Yellow   Appearance Ur Cloudy (A) Clear   Leukocytes, UA Negative Negative   Protein, UA 1+ (A) Negative/Trace   Glucose, UA Negative Negative   Ketones, UA Negative Negative   RBC, UA Negative Negative   Bilirubin, UA Negative Negative   Urobilinogen, Ur 0.2 0.2 - 1.0 mg/dL   Nitrite, UA Negative Negative   Microscopic Examination See below:       Assessment & Plan:   Problem List Items Addressed This Visit      Other   Depression, major, single episode, severe (HCC) - Primary    Doing much better! Continue current regimen. Continue to monitor. Recheck 6 months.       Relevant Medications   buPROPion (WELLBUTRIN SR) 150 MG 12 hr tablet   hydrOXYzine (ATARAX/VISTARIL) 25 MG tablet   sertraline (ZOLOFT) 100 MG tablet    Other Visit Diagnoses    Thrombosed external hemorrhoid       Resolved. Call with any concerns.        Follow up plan: Return in about 6 months (around 06/04/2018) for Physical.

## 2017-12-11 ENCOUNTER — Encounter: Payer: Self-pay | Admitting: Unknown Physician Specialty

## 2017-12-11 ENCOUNTER — Ambulatory Visit (INDEPENDENT_AMBULATORY_CARE_PROVIDER_SITE_OTHER): Payer: 59 | Admitting: Unknown Physician Specialty

## 2017-12-11 VITALS — BP 117/76 | HR 84 | Temp 98.4°F | Ht 64.0 in | Wt 134.4 lb

## 2017-12-11 DIAGNOSIS — K5909 Other constipation: Secondary | ICD-10-CM | POA: Diagnosis not present

## 2017-12-11 DIAGNOSIS — N898 Other specified noninflammatory disorders of vagina: Secondary | ICD-10-CM

## 2017-12-11 LAB — WET PREP FOR TRICH, YEAST, CLUE
Clue Cell Exam: POSITIVE — AB
TRICHOMONAS EXAM: NEGATIVE
YEAST EXAM: NEGATIVE

## 2017-12-11 MED ORDER — SECNIDAZOLE 2 G PO PACK: 2.0000 g | PACK | Freq: Once | ORAL | 2 refills | Status: AC

## 2017-12-11 NOTE — Patient Instructions (Addendum)
Miralax for constipation   Bacterial Vaginosis Bacterial vaginosis is a vaginal infection that occurs when the normal balance of bacteria in the vagina is disrupted. It results from an overgrowth of certain bacteria. This is the most common vaginal infection among women ages 87-44. Because bacterial vaginosis increases your risk for STIs (sexually transmitted infections), getting treated can help reduce your risk for chlamydia, gonorrhea, herpes, and HIV (human immunodeficiency virus). Treatment is also important for preventing complications in pregnant women, because this condition can cause an early (premature) delivery. What are the causes? This condition is caused by an increase in harmful bacteria that are normally present in small amounts in the vagina. However, the reason that the condition develops is not fully understood. What increases the risk? The following factors may make you more likely to develop this condition:  Having a new sexual partner or multiple sexual partners.  Having unprotected sex.  Douching.  Having an intrauterine device (IUD).  Smoking.  Drug and alcohol abuse.  Taking certain antibiotic medicines.  Being pregnant.  You cannot get bacterial vaginosis from toilet seats, bedding, swimming pools, or contact with objects around you. What are the signs or symptoms? Symptoms of this condition include:  Grey or white vaginal discharge. The discharge can also be watery or foamy.  A fish-like odor with discharge, especially after sexual intercourse or during menstruation.  Itching in and around the vagina.  Burning or pain with urination.  Some women with bacterial vaginosis have no signs or symptoms. How is this diagnosed? This condition is diagnosed based on:  Your medical history.  A physical exam of the vagina.  Testing a sample of vaginal fluid under a microscope to look for a large amount of bad bacteria or abnormal cells. Your health care  provider may use a cotton swab or a small wooden spatula to collect the sample.  How is this treated? This condition is treated with antibiotics. These may be given as a pill, a vaginal cream, or a medicine that is put into the vagina (suppository). If the condition comes back after treatment, a second round of antibiotics may be needed. Follow these instructions at home: Medicines  Take over-the-counter and prescription medicines only as told by your health care provider.  Take or use your antibiotic as told by your health care provider. Do not stop taking or using the antibiotic even if you start to feel better. General instructions  If you have a female sexual partner, tell her that you have a vaginal infection. She should see her health care provider and be treated if she has symptoms. If you have a female sexual partner, he does not need treatment.  During treatment: ? Avoid sexual activity until you finish treatment. ? Do not douche. ? Avoid alcohol as directed by your health care provider. ? Avoid breastfeeding as directed by your health care provider.  Drink enough water and fluids to keep your urine clear or pale yellow.  Keep the area around your vagina and rectum clean. ? Wash the area daily with warm water. ? Wipe yourself from front to back after using the toilet.  Keep all follow-up visits as told by your health care provider. This is important. How is this prevented?  Do not douche.  Wash the outside of your vagina with warm water only.  Use protection when having sex. This includes latex condoms and dental dams.  Limit how many sexual partners you have. To help prevent bacterial vaginosis, it is best  to have sex with just one partner (monogamous).  Make sure you and your sexual partner are tested for STIs.  Wear cotton or cotton-lined underwear.  Avoid wearing tight pants and pantyhose, especially during summer.  Limit the amount of alcohol that you  drink.  Do not use any products that contain nicotine or tobacco, such as cigarettes and e-cigarettes. If you need help quitting, ask your health care provider.  Do not use illegal drugs. Where to find more information:  Centers for Disease Control and Prevention: SolutionApps.co.za  American Sexual Health Association (ASHA): www.ashastd.org  U.[place] Department of Health and Health and safety inspector, Office on Women's Health: ConventionalMedicines.si or http://www.anderson-williamson.info/ Contact a health care provider if:  Your symptoms do not improve, even after treatment.  You have more discharge or pain when urinating.  You have a fever.  You have pain in your abdomen.  You have pain during sex.  You have vaginal bleeding between periods. Summary  Bacterial vaginosis is a vaginal infection that occurs when the normal balance of bacteria in the vagina is disrupted.  Because bacterial vaginosis increases your risk for STIs (sexually transmitted infections), getting treated can help reduce your risk for chlamydia, gonorrhea, herpes, and HIV (human immunodeficiency virus). Treatment is also important for preventing complications in pregnant women, because the condition can cause an early (premature) delivery.  This condition is treated with antibiotic medicines. These may be given as a pill, a vaginal cream, or a medicine that is put into the vagina (suppository). This information is not intended to replace advice given to you by your health care provider. Make sure you discuss any questions you have with your health care provider. Document Released: 03/10/2005 Document Revised: 07/14/2016 Document Reviewed: 11/24/2015 Elsevier Interactive Patient Education  2018 [employer].  Constipation, Adult Constipation is when a person has fewer bowel movements in a week than normal, has difficulty having a bowel movement, or has stools that are dry, hard, or larger than normal.  Constipation may be caused by an underlying condition. It may become worse with age if a person takes certain medicines and does not take in enough fluids. Follow these instructions at home: Eating and drinking   Eat foods that have a lot of fiber, such as fresh fruits and vegetables, whole grains, and beans.  Limit foods that are high in fat, low in fiber, or overly processed, such as french fries, hamburgers, cookies, candies, and soda.  Drink enough fluid to keep your urine clear or pale yellow. General instructions  Exercise regularly or as told by your health care provider.  Go to the restroom when you have the urge to go. Do not hold it in.  Take over-the-counter and prescription medicines only as told by your health care provider. These include any fiber supplements.  Practice pelvic floor retraining exercises, such as deep breathing while relaxing the lower abdomen and pelvic floor relaxation during bowel movements.  Watch your condition for any changes.  Keep all follow-up visits as told by your health care provider. This is important. Contact a health care provider if:  You have pain that gets worse.  You have a fever.  You do not have a bowel movement after 4 days.  You vomit.  You are not hungry.  You lose weight.  You are bleeding from the anus.  You have thin, pencil-like stools. Get help right away if:  You have a fever and your symptoms suddenly get worse.  You leak stool or have blood in  your stool.  Your abdomen is bloated.  You have severe pain in your abdomen.  You feel dizzy or you faint. This information is not intended to replace advice given to you by your health care provider. Make sure you discuss any questions you have with your health care provider. Document Released: 12/07/2003 Document Revised: 09/28/2015 Document Reviewed: 08/29/2015 Elsevier Interactive Patient Education  2018 [employer]

## 2017-12-11 NOTE — Assessment & Plan Note (Addendum)
Discussed diet.  Take Miralax if 2 days without BMs.  Increase fluids

## 2017-12-11 NOTE — Progress Notes (Signed)
BP 117/76   Pulse 84   Temp 98.4 F (36.9 C) (Oral)   Ht 5\' 4"  (1.626 m)   Wt 134 lb 6.4 oz (61 kg)   SpO2 99%   BMI 23.07 kg/m    Subjective:    Patient ID: Brittany Page, female    DOB: 03/29/1990, 27 y.o.   MRN: [id number]  HPI: Brittany Page is a 27 y.o. female  Chief Complaint  Patient presents with  . Vaginal Discharge    pt states she has been having discharge and odor for a couple of weeks    Pt states she is "always coming in for this."  She would like something to "clean her out."    Vaginal Discharge  The patient's primary symptoms include vaginal discharge. This is a recurrent problem. The current episode started 1 to 4 weeks ago. The problem occurs constantly. The problem has been unchanged. The patient is experiencing no pain. The problem affects both sides. She is not pregnant. Associated symptoms include constipation. Pertinent negatives include no abdominal pain, anorexia, back pain, chills, diarrhea, discolored urine, dysuria, fever, flank pain, frequency, headaches, hematuria, joint pain, joint swelling, nausea, painful intercourse, rash, sore throat, urgency or vomiting. The vaginal discharge was malodorous, copious and grey. There has been no bleeding. She has not been passing clots. She has not been passing tissue. Nothing aggravates the symptoms. Treatments tried: treatment here. She is not sexually active. No, her partner does not have an STD. She uses an IUD for contraception. Her menstrual history has been irregular (Due to IUD). Her past medical history is significant for an STD and vaginosis. There is no history of an abdominal surgery. (GC, Chlamydia, Trichimonis)    Relevant past medical, surgical, family and social history reviewed and updated as indicated. Interim medical history since our last visit reviewed. Allergies and medications reviewed and updated.  Review of Systems  Constitutional: Negative for chills and fever.  HENT: Negative for sore  throat.   Gastrointestinal: Positive for constipation. Negative for abdominal pain, anorexia, diarrhea, nausea and vomiting.       1-2 BMs/week  Genitourinary: Positive for vaginal discharge. Negative for dysuria, flank pain, frequency, hematuria and urgency.  Musculoskeletal: Negative for back pain and joint pain.  Skin: Negative for rash.  Neurological: Negative for headaches.    Per HPI unless specifically indicated above     Objective:    BP 117/76   Pulse 84   Temp 98.4 F (36.9 C) (Oral)   Ht 5\' 4"  (1.626 m)   Wt 134 lb 6.4 oz (61 kg)   SpO2 99%   BMI 23.07 kg/m   Wt Readings from Last 3 Encounters:  12/11/17 134 lb 6.4 oz (61 kg)  12/04/17 134 lb (60.8 kg)  11/25/17 135 lb (61.2 kg)    Physical Exam  Constitutional: She is oriented to person, place, and time. She appears well-developed and well-nourished. No distress.  HENT:  Head: Normocephalic and atraumatic.  Eyes: Conjunctivae and lids are normal. Right eye exhibits no discharge. Left eye exhibits no discharge. No scleral icterus.  Neck: Normal range of motion. Neck supple. No JVD present. Carotid bruit is not present.  Cardiovascular: Normal rate, regular rhythm and normal heart sounds.  Pulmonary/Chest: Effort normal and breath sounds normal.  Abdominal: Soft. Normal appearance. There is no splenomegaly or hepatomegaly.  Genitourinary: Rectum normal. There is no rash on the right labia. There is no rash on the left labia. Cervix exhibits no motion tenderness,  no discharge and no friability. Right adnexum displays no mass, no tenderness and no fullness. Left adnexum displays no mass, no tenderness and no fullness. Vaginal discharge found.  Musculoskeletal: Normal range of motion.  Neurological: She is alert and oriented to person, place, and time.  Skin: Skin is warm, dry and intact. No rash noted. No pallor.  Psychiatric: She has a normal mood and affect. Her behavior is normal. Judgment and thought content  normal.    Results for orders placed or performed in visit on 09/28/17  WET PREP FOR TRICH, YEAST, CLUE  Result Value Ref Range   Trichomonas Exam Negative Negative   Yeast Exam Negative Negative   Clue Cell Exam Positive (A) Negative  Microscopic Examination  Result Value Ref Range   WBC, UA 0-5 0 - 5 /hpf   RBC, UA 0-2 0 - 2 /hpf   Epithelial Cells (non renal) 0-10 0 - 10 /hpf   Mucus, UA Present Not Estab.   Bacteria, UA Few None seen/Few  UA/M w/rflx Culture, Routine  Result Value Ref Range   Specific Gravity, UA >1.030 (H) 1.005 - 1.030   pH, UA 6.0 5.0 - 7.5   Color, UA Orange Yellow   Appearance Ur Cloudy (A) Clear   Leukocytes, UA Negative Negative   Protein, UA 1+ (A) Negative/Trace   Glucose, UA Negative Negative   Ketones, UA Negative Negative   RBC, UA Negative Negative   Bilirubin, UA Negative Negative   Urobilinogen, Ur 0.2 0.2 - 1.0 mg/dL   Nitrite, UA Negative Negative   Microscopic Examination See below:                      Assessment & Plan:   Problem List Items Addressed This Visit      Unprioritized   Chronic constipation    Discussed diet.  Take Miralax if 2 days without BMs.  Increase fluids         Other Visit Diagnoses    Vaginal discharge    -  Primary   Recurrent problem.  STD check.  Will rx Secendizole at pt request with refills.  Discussed hygeine practices   Relevant Orders   WET PREP FOR TRICH, YEAST, CLUE   HIV Antibody (routine testing w rflx)   RPR   HSV(herpes simplex vrs) 1+2 ab-IgG   GC/Chlamydia Probe Amp([facility])       Follow up plan: Return if symptoms worsen or fail to improve.

## 2017-12-12 LAB — HSV(HERPES SIMPLEX VRS) I + II AB-IGG
HSV 1 Glycoprotein G Ab, IgG: <0.91 index (ref 0.00–0.90)
HSV 2 IgG, Type Spec: 18.6 index — ABNORMAL HIGH (ref 0.00–0.90)

## 2017-12-12 LAB — RPR: RPR Ser Ql: NONREACTIVE

## 2017-12-12 LAB — HIV ANTIBODY (ROUTINE TESTING W REFLEX): HIV Screen 4th Generation wRfx: NONREACTIVE

## 2017-12-14 ENCOUNTER — Encounter: Payer: Self-pay | Admitting: Family Medicine

## 2017-12-14 ENCOUNTER — Telehealth: Payer: Self-pay | Admitting: Unknown Physician Specialty

## 2017-12-14 LAB — GC/CHLAMYDIA PROBE AMP
Chlamydia trachomatis, NAA: NEGATIVE
NEISSERIA GONORRHOEAE BY PCR: NEGATIVE

## 2017-12-14 MED ORDER — VALACYCLOVIR HCL 500 MG PO TABS: 500.0000 mg | ORAL_TABLET | Freq: Every day | ORAL | 3 refills | Status: DC

## 2017-12-14 NOTE — Telephone Encounter (Signed)
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  Discussed options.  She is interested in chronic suppressive therapy.  She does not recall any symptoms.  Will start Valtrex 500 mg daily.  [facility]

## 2017-12-21 ENCOUNTER — Other Ambulatory Visit: Payer: Self-pay | Admitting: Family Medicine

## 2017-12-21 MED ORDER — SERTRALINE HCL 100 MG PO TABS: 150.0000 mg | ORAL_TABLET | Freq: Every day | ORAL | 3 refills | Status: DC

## 2017-12-21 NOTE — Addendum Note (Signed)
Addended by: Jamaar Howes P on: 12/21/2017 08:53 AM   Modules accepted: Orders  

## 2017-12-22 ENCOUNTER — Ambulatory Visit: Payer: Self-pay

## 2017-12-22 ENCOUNTER — Other Ambulatory Visit: Payer: Self-pay | Admitting: Family Medicine

## 2017-12-22 NOTE — Telephone Encounter (Signed)
Pt. Feels like her vomiting, nausea,dizziness and headache are from her Valtrex. Please advise pt.   Answer Assessment - Initial Assessment Questions 1. SYMPTOMS: "Do you have any symptoms?"     Started her Valtrex 1 week ago. Yesterday vomited 4 times, has nausea,dizziness today, as well as a headache. Feels this is related to the Valtrex. 2. SEVERITY: If symptoms are present, ask "Are they mild, moderate or severe?"     Moderate  Protocols used: MEDICATION QUESTION CALL-A-AH

## 2017-12-22 NOTE — Telephone Encounter (Signed)
Requested Prescriptions  Pending Prescriptions Disp Refills  . sertraline (ZOLOFT) 100 MG tablet [Pharmacy Med Name: SERTRALINE HCL 100 MG TABLET] 45 tablet 3    Sig: TAKE 1.5 TABLETS (150 MG TOTAL) BY MOUTH DAILY.     Psychiatry:  Antidepressants - SSRI Passed - 12/22/2017 11:30 AM      Passed - Completed PHQ-2 or PHQ-9 in the last 360 days.      Passed - Valid encounter within last 6 months    Recent Outpatient Visits          1 week ago Vaginal discharge   [facility] Gabriel Cirri, NP   2 weeks ago Depression, major, single episode, severe (HCC)   [facility] [place], Megan P, DO   3 weeks ago Thrombosed external hemorrhoid   [facility] Jodi Marble, Adriana M, PA-C   1 month ago Depression, major, single episode, severe [facility])   [facility] [place], Megan P, DO   2 months ago Depression, major, single episode, severe [facility])   [facility] [place], Oralia Rud, DO      Future Appointments            In 5 months Johnson, Oralia Rud, DO [employer], [facility]

## 2018-03-26 ENCOUNTER — Telehealth: Payer: Self-pay | Admitting: Obstetrics and Gynecology

## 2018-03-26 NOTE — Telephone Encounter (Signed)
The patient states she has the Mirena that does not "let you bleed" and she is having bleeding and breakthrough bleeding.  IUD insertion was in February and had a cycle 2x last month.  She is very concerned, and is asking for someone to call her today if at all possible.  She has been having unprotected sex and is asking about having sex and possibility of pregnancy, and her bleeding now is dark brown & orange, basically spotting, and comes out a lot when she voids, not filling up a pad, and she has not done a test, please advise, thanks.

## 2018-03-26 NOTE — Telephone Encounter (Signed)
Patient has appointment scheduled for Wednesday to see Dr. Valentino Saxon. She is concerned about possibly being pregnant since she is having the bleeding. Patient denied any pain today. Patient is fine with just talking to Dr. Valentino Saxon Wednesday.

## 2018-03-31 ENCOUNTER — Ambulatory Visit: Payer: 59 | Admitting: Obstetrics and Gynecology

## 2018-06-04 ENCOUNTER — Encounter: Payer: Self-pay | Admitting: Family Medicine

## 2019-02-15 ENCOUNTER — Ambulatory Visit: Payer: Self-pay | Admitting: Family Medicine

## 2019-02-23 ENCOUNTER — Other Ambulatory Visit: Payer: Self-pay

## 2019-02-23 DIAGNOSIS — Z20822 Contact with and (suspected) exposure to covid-19: Secondary | ICD-10-CM

## 2019-02-26 LAB — NOVEL CORONAVIRUS, NAA: SARS-CoV-2, NAA: NOT DETECTED

## 2019-05-19 ENCOUNTER — Ambulatory Visit (INDEPENDENT_AMBULATORY_CARE_PROVIDER_SITE_OTHER): Payer: PRIVATE HEALTH INSURANCE | Admitting: Family Medicine

## 2019-05-19 ENCOUNTER — Other Ambulatory Visit: Payer: Self-pay

## 2019-05-19 ENCOUNTER — Encounter: Payer: Self-pay | Admitting: Family Medicine

## 2019-05-19 VITALS — BP 105/68 | HR 81 | Temp 98.0°F

## 2019-05-19 DIAGNOSIS — Z113 Encounter for screening for infections with a predominantly sexual mode of transmission: Secondary | ICD-10-CM | POA: Diagnosis not present

## 2019-05-19 DIAGNOSIS — B9689 Other specified bacterial agents as the cause of diseases classified elsewhere: Secondary | ICD-10-CM

## 2019-05-19 DIAGNOSIS — F339 Major depressive disorder, recurrent, unspecified: Secondary | ICD-10-CM

## 2019-05-19 DIAGNOSIS — N760 Acute vaginitis: Secondary | ICD-10-CM | POA: Diagnosis not present

## 2019-05-19 LAB — WET PREP FOR TRICH, YEAST, CLUE
Clue Cell Exam: POSITIVE — AB
Trichomonas Exam: NEGATIVE
Yeast Exam: NEGATIVE

## 2019-05-19 MED ORDER — SERTRALINE HCL 100 MG PO TABS: ORAL_TABLET | ORAL | 1 refills | Status: DC

## 2019-05-19 MED ORDER — METRONIDAZOLE 500 MG PO TABS: 500.0000 mg | ORAL_TABLET | Freq: Two times a day (BID) | ORAL | 0 refills | Status: DC

## 2019-05-19 MED ORDER — HYDROXYZINE HCL 25 MG PO TABS: 25.0000 mg | ORAL_TABLET | Freq: Three times a day (TID) | ORAL | 1 refills | Status: DC | PRN

## 2019-05-19 MED ORDER — VALACYCLOVIR HCL 500 MG PO TABS: 500.0000 mg | ORAL_TABLET | Freq: Every day | ORAL | 3 refills | Status: DC

## 2019-05-19 NOTE — Assessment & Plan Note (Signed)
Not doing well off her medicine. Will restart her zoloft and recheck 1 month. Call with any concerns.

## 2019-05-19 NOTE — Progress Notes (Signed)
BP 105/68 (BP Location: Right Arm, Patient Position: Sitting, Cuff Size: Normal)   Pulse 81   Temp 98 F (36.7 C) (Oral)   SpO2 99%    Subjective:    Patient ID: Brittany Page, female    DOB: 1991-03-12, 29 y.o.   MRN: [id number]  HPI: Brittany Page is a 29 y.o. female  Chief Complaint  Patient presents with  . STI screening   STD SCREENING- ex boyfriend has tested positive for chalmydia, vaginal irritation and anal irritation Sexual activity:  Recent unprotected sexual encounter Contraception: no Recent unprotected intercourse: yes History of sexually transmitted diseases: yes Previous sexually transmitted disease screening: yes Genital lesions: no Vaginal discharge: yes Dysuria: no Swollen lymph nodes: no Fevers: no Rash: no   DEPRESSION- had to stop her medicine due to cost Mood status: uncontrolled Satisfied with current treatment?: no Symptom severity: mild  Duration of current treatment : has been off meds for several months Side effects: no Medication compliance: poor compliance Psychotherapy/counseling: no  Previous psychiatric medications: sertraline, wellbutrin, hydroxyzine Depressed mood: yes Anxious mood: yes Anhedonia: no Significant weight loss or gain: no Insomnia: no  Fatigue: yes Feelings of worthlessness or guilt: no Impaired concentration/indecisiveness: no Suicidal ideations: no Hopelessness: no Crying spells: no Depression screen [facility] 2/9 12/04/2017 11/17/2017 10/08/2017 09/01/2017 08/04/2017  Decreased Interest 0 1 2 1 1   Down, Depressed, Hopeless 0 1 3 1 2   PHQ - 2 Score 0 2 5 2 3   Altered sleeping 0 1 1 0 1  Tired, decreased energy 1 2 2 1 1   Change in appetite 0 1 0 1 0  Feeling bad or failure about yourself  0 2 1 2  0  Trouble concentrating 0 2 2 1  0  Moving slowly or fidgety/restless 0 0 0 0 0  Suicidal thoughts 0 0 0 0 0  PHQ-9 Score 1 10 11 7 5   Difficult doing work/chores Not difficult at all Very difficult Very difficult Somewhat  difficult -    Relevant past medical, surgical, family and social history reviewed and updated as indicated. Interim medical history since our last visit reviewed. Allergies and medications reviewed and updated.  Review of Systems  Constitutional: Negative.   Respiratory: Negative.   Cardiovascular: Negative.   Genitourinary: Positive for vaginal discharge. Negative for decreased urine volume, difficulty urinating, dyspareunia, dysuria, enuresis, flank pain, frequency, genital sores, hematuria, menstrual problem, pelvic pain, urgency, vaginal bleeding and vaginal pain.  Musculoskeletal: Negative.   Psychiatric/Behavioral: Positive for dysphoric mood. Negative for agitation, behavioral problems, confusion, decreased concentration, hallucinations, self-injury, sleep disturbance and suicidal ideas. The patient is nervous/anxious. The patient is not hyperactive.     Per HPI unless specifically indicated above     Objective:    BP 105/68 (BP Location: Right Arm, Patient Position: Sitting, Cuff Size: Normal)   Pulse 81   Temp 98 F (36.7 C) (Oral)   SpO2 99%   Wt Readings from Last 3 Encounters:  12/11/17 134 lb 6.4 oz (61 kg)  12/04/17 134 lb (60.8 kg)  11/25/17 135 lb (61.2 kg)    Physical Exam Vitals and nursing note reviewed.  Constitutional:      General: She is not in acute distress.    Appearance: Normal appearance. She is not ill-appearing, toxic-appearing or diaphoretic.  HENT:     Head: Normocephalic and atraumatic.     Right Ear: External ear normal.     Left Ear: External ear normal.     Nose: Nose normal.  Mouth/Throat:     Mouth: Mucous membranes are moist.     Pharynx: Oropharynx is clear.  Eyes:     General: No scleral icterus.       Right eye: No discharge.        Left eye: No discharge.     Extraocular Movements: Extraocular movements intact.     Conjunctiva/sclera: Conjunctivae normal.     Pupils: Pupils are equal, round, and reactive to light.    Cardiovascular:     Rate and Rhythm: Normal rate and regular rhythm.     Pulses: Normal pulses.     Heart sounds: Normal heart sounds. No murmur. No friction rub. No gallop.   Pulmonary:     Effort: Pulmonary effort is normal. No respiratory distress.     Breath sounds: Normal breath sounds. No stridor. No wheezing, rhonchi or rales.  Chest:     Chest wall: No tenderness.  Musculoskeletal:        General: Normal range of motion.     Cervical back: Normal range of motion and neck supple.  Skin:    General: Skin is warm and dry.     Capillary Refill: Capillary refill takes less than 2 seconds.     Coloration: Skin is not jaundiced or pale.     Findings: No bruising, erythema, lesion or rash.  Neurological:     General: No focal deficit present.     Mental Status: She is alert and oriented to person, place, and time. Mental status is at baseline.  Psychiatric:        Mood and Affect: Mood normal.        Behavior: Behavior normal.        Thought Content: Thought content normal.        Judgment: Judgment normal.     Results for orders placed or performed in visit on 02/23/19  Novel Coronavirus, NAA ([facility])   Specimen: Nasopharyngeal(NP) swabs in vial transport medium   NASOPHARYNGE  TESTING  Result Value Ref Range   SARS-CoV-2, NAA Not Detected Not Detected      Assessment & Plan:   Problem List Items Addressed This Visit      Other   Depression, recurrent (HCC)    Not doing well off her medicine. Will restart her zoloft and recheck 1 month. Call with any concerns.       Relevant Medications   sertraline (ZOLOFT) 100 MG tablet   hydrOXYzine (ATARAX/VISTARIL) 25 MG tablet    Other Visit Diagnoses    Routine screening for STI (sexually transmitted infection)    -  Primary   Labs drawn today. Await results. Treat as needed.    Relevant Orders   HIV Antibody (routine testing w rflx)   RPR   GC/Chlamydia Probe Amp   Hepatitis, Acute   HSV(herpes simplex vrs) 1+2  ab-IgG   WET PREP FOR TRICH, YEAST, CLUE   BV (bacterial vaginosis)       Will treat with flagyl. Call with any concerns. Continue to monitor.    Relevant Medications   valACYclovir (VALTREX) 500 MG tablet   metroNIDAZOLE (FLAGYL) 500 MG tablet       Follow up plan: Return in about 4 weeks (around 06/16/2019) for follow up mood.

## 2019-05-24 ENCOUNTER — Telehealth: Payer: Self-pay | Admitting: Family Medicine

## 2019-05-24 NOTE — Telephone Encounter (Signed)
I spoke with patient yesterday because she had called in for results. Explained that she did not have them drawn and she is supposed to be coming by this afternoon to have them drawn.

## 2019-05-24 NOTE — Telephone Encounter (Signed)
LVM requesting call back.

## 2019-05-24 NOTE — Telephone Encounter (Signed)
Hey it doesn't look like her labs were drawn last week, but she needed to have them drawn- can we get her back in to get her labs done?

## 2019-05-24 NOTE — Telephone Encounter (Signed)
Called to, no answer, LVM to call me back.

## 2019-05-25 LAB — HEPATITIS PANEL, ACUTE
Hep A IgM: NEGATIVE
Hep B C IgM: NEGATIVE
Hep C Virus Ab: <0.1 s/co ratio (ref 0.0–0.9)
Hepatitis B Surface Ag: NEGATIVE

## 2019-05-25 LAB — RPR: RPR Ser Ql: NONREACTIVE

## 2019-05-25 LAB — HSV(HERPES SIMPLEX VRS) I + II AB-IGG
HSV 1 Glycoprotein G Ab, IgG: <0.91 index (ref 0.00–0.90)
HSV 2 IgG, Type Spec: 15.7 index — ABNORMAL HIGH (ref 0.00–0.90)

## 2019-05-25 LAB — HIV ANTIBODY (ROUTINE TESTING W REFLEX): HIV Screen 4th Generation wRfx: NONREACTIVE

## 2019-05-26 ENCOUNTER — Other Ambulatory Visit: Payer: Self-pay | Admitting: Family Medicine

## 2019-05-26 LAB — GC/CHLAMYDIA PROBE AMP
Chlamydia trachomatis, NAA: POSITIVE — AB
Neisseria Gonorrhoeae by PCR: NEGATIVE

## 2019-05-26 MED ORDER — AZITHROMYCIN 250 MG PO TABS: 1000.0000 mg | ORAL_TABLET | Freq: Once | ORAL | 0 refills | Status: AC

## 2019-05-26 NOTE — Telephone Encounter (Signed)
Routing to provider  

## 2019-05-26 NOTE — Telephone Encounter (Signed)
Pt returned call, plans to come to office to retrieve her missing medication.

## 2019-05-26 NOTE — Telephone Encounter (Signed)
Pt called to see if rx was going to be sent to her pharmacy today due to positive test results seen on Mychart. Please advise.  [facility] [address] - [place], [place] - [address] Phone:  [phone]  Fax:  [phone]

## 2019-05-27 ENCOUNTER — Other Ambulatory Visit: Payer: Self-pay | Admitting: Family Medicine

## 2019-05-27 ENCOUNTER — Telehealth: Payer: Self-pay

## 2019-05-27 MED ORDER — AZITHROMYCIN 250 MG PO TABS: 1000.0000 mg | ORAL_TABLET | Freq: Once | ORAL | 0 refills | Status: AC

## 2019-05-27 NOTE — Telephone Encounter (Signed)
Explained to patient that she takes the four pills at once and the treatment is completed.  Copied from CRM [id number]. Topic: General - Other >> May 27, 2019  4:07 PM Dalphine Handing A wrote: Patient would like a callback from nurse to go over instructions for medication azithromycin (ZITHROMAX) 250 MG tablet. Patient feels directions arent clear. Please advise

## 2019-05-27 NOTE — Telephone Encounter (Signed)
I don;t know what this means. We do not have any medicine for her here. Her antibiotic is at the pharmacy- as I told her through mychart.

## 2019-05-30 ENCOUNTER — Ambulatory Visit: Payer: Self-pay | Admitting: Family Medicine

## 2019-08-05 ENCOUNTER — Encounter: Payer: Self-pay | Admitting: Family Medicine

## 2019-08-05 ENCOUNTER — Other Ambulatory Visit: Payer: Self-pay

## 2019-08-05 ENCOUNTER — Ambulatory Visit (INDEPENDENT_AMBULATORY_CARE_PROVIDER_SITE_OTHER): Payer: PRIVATE HEALTH INSURANCE | Admitting: Family Medicine

## 2019-08-05 VITALS — BP 125/83 | HR 80 | Temp 98.2°F | Ht 64.08 in | Wt 139.0 lb

## 2019-08-05 DIAGNOSIS — A599 Trichomoniasis, unspecified: Secondary | ICD-10-CM

## 2019-08-05 DIAGNOSIS — F339 Major depressive disorder, recurrent, unspecified: Secondary | ICD-10-CM

## 2019-08-05 DIAGNOSIS — B9689 Other specified bacterial agents as the cause of diseases classified elsewhere: Secondary | ICD-10-CM

## 2019-08-05 DIAGNOSIS — Z113 Encounter for screening for infections with a predominantly sexual mode of transmission: Secondary | ICD-10-CM | POA: Diagnosis not present

## 2019-08-05 DIAGNOSIS — N760 Acute vaginitis: Secondary | ICD-10-CM

## 2019-08-05 DIAGNOSIS — R300 Dysuria: Secondary | ICD-10-CM

## 2019-08-05 LAB — WET PREP FOR TRICH, YEAST, CLUE
Clue Cell Exam: POSITIVE — AB
Trichomonas Exam: POSITIVE — AB
Yeast Exam: NEGATIVE

## 2019-08-05 MED ORDER — METRONIDAZOLE 500 MG PO TABS: 500.0000 mg | ORAL_TABLET | Freq: Two times a day (BID) | ORAL | 0 refills | Status: DC

## 2019-08-05 NOTE — Assessment & Plan Note (Signed)
Doing OK. Has not started her zoloft. Will continue to monitor. Call if she wants to starts her zoloft.

## 2019-08-05 NOTE — Patient Instructions (Signed)
Trichomoniasis Trichomoniasis is an STI (sexually transmitted infection) that can affect both women and men. In women, the outer area of the female genitalia (vulva) and the vagina are affected. In men, mainly the penis is affected, but the prostate and other reproductive organs can also be involved.  This condition can be treated with medicine. It often has no symptoms (is asymptomatic), especially in men. If not treated, trichomoniasis can last for months or years. What are the causes? This condition is caused by a parasite called Trichomonas vaginalis. Trichomoniasis most often spreads from person to person (is contagious) through sexual contact. What increases the risk? The following factors may make you more likely to develop this condition:  Having unprotected sex.  Having sex with a partner who has trichomoniasis.  Having multiple sexual partners.  Having had previous trichomoniasis infections or other STIs. What are the signs or symptoms? In women, symptoms of trichomoniasis include:  Abnormal vaginal discharge that is clear, white, gray, or yellow-green and foamy and has an unusual "fishy" odor.  Itching and irritation of the vagina and vulva.  Burning or pain during urination or sex.  Redness and swelling of the genitals. In men, symptoms of trichomoniasis include:  Penile discharge that may be foamy or contain pus.  Pain in the penis. This may happen only when urinating.  Itching or irritation inside the penis.  Burning after urination or ejaculation. How is this diagnosed? In women, this condition may be found during a routine Pap test or physical exam. It may be found in men during a routine physical exam. Your health care provider may do tests to help diagnose this infection, such as:  Urine tests (men and women).  The following in women: ? Testing the pH of the vagina. ? A vaginal swab test that checks for the Trichomonas vaginalis parasite. ? Testing vaginal  secretions. Your health care provider may test you for other STIs, including HIV (human immunodeficiency virus). How is this treated? This condition is treated with medicine taken by mouth (orally), such as metronidazole or tinidazole, to fight the infection. Your sexual partner(s) also need to be tested and treated.  If you are a woman and you plan to become pregnant or think you may be pregnant, tell your health care provider right away. Some medicines that are used to treat the infection should not be taken during pregnancy. Your health care provider may recommend over-the-counter medicines or creams to help relieve itching or irritation. You may be tested for infection again 3 months after treatment. Follow these instructions at home:  Take and use over-the-counter and prescription medicines, including creams, only as told by your health care provider.  Take your antibiotic medicine as told by your health care provider. Do not stop taking the antibiotic even if you start to feel better.  Do not have sex until 7-10 days after you finish your medicine, or until your health care provider approves. Ask your health care provider when you may start to have sex again.  (Women) Do not douche or wear tampons while you have the infection.  Discuss your infection with your sexual partner(s). Make sure that your partner gets tested and treated, if necessary.  Keep all follow-up visits as told by your health care provider. This is important. How is this prevented?   Use condoms every time you have sex. Using condoms correctly and consistently can help protect against STIs.  Avoid having multiple sexual partners.  Talk with your sexual partner about any   symptoms that either of you may have, as well as any history of STIs.  Get tested for STIs and STDs (sexually transmitted diseases) before you have sex. Ask your partner to do the same.  Do not have sexual contact if you have symptoms of  trichomoniasis or another STI. Contact a health care provider if:  You still have symptoms after you finish your medicine.  You develop pain in your abdomen.  You have pain when you urinate.  You have bleeding after sex.  You develop a rash.  You feel nauseous or you vomit.  You plan to become pregnant or think you may be pregnant. Summary  Trichomoniasis is an STI (sexually transmitted infection) that can affect both women and men.  This condition often has no symptoms (is asymptomatic), especially in men.  Without treatment, this condition can last for months or years.  You should not have sex until 7-10 days after you finish your medicine, or until your health care provider approves. Ask your health care provider when you may start to have sex again.  Discuss your infection with your sexual partner(s). Make sure that your partner gets tested and treated, if necessary. This information is not intended to replace advice given to you by your health care provider. Make sure you discuss any questions you have with your health care provider. Document Revised: 12/22/2017 Document Reviewed: 12/22/2017 Elsevier Patient Education  2020 [employer]  

## 2019-08-05 NOTE — Progress Notes (Signed)
BP 125/83 (BP Location: Left Arm, Patient Position: Sitting, Cuff Size: Normal)   Pulse 80   Temp 98.2 F (36.8 C) (Oral)   Ht 5' 4.08" (1.628 m)   Wt 139 lb (63 kg)   LMP 08/04/2019   SpO2 98%   BMI 23.80 kg/m    Subjective:    Patient ID: Brittany Page, female    DOB: Aug 17, 1990, 29 y.o.   MRN: [id number]  HPI: Brittany Page is a 29 y.o. female  Chief Complaint  Patient presents with  . Vaginal Discharge  . vaginal smell  . genital herpes    outbreak  . Vaginal Itching  . burning urination   STD SCREENING Sexual activity:  Practices careful partner selection, always uses condoms Contraception: no Recent unprotected intercourse: no History of sexually transmitted diseases: yes Previous sexually transmitted disease screening: no Genital lesions: yes Vaginal discharge: yes Dysuria: yes Swollen lymph nodes: no Fevers: no Rash: no  VAGINAL DISCHARGE Duration: 3 months Discharge description: yellow  Pruritus: yes Dysuria: yes Malodorous: yes Urinary frequency: no Fevers: no Abdominal pain: no  Sexual activity: practicing safe sex History of sexually transmitted diseases: no Recent antibiotic use: yes Context: recurrent BV  Treatments attempted: none  DEPRESSION- did not start her zoloft as she's trying to do things to get her feeling better before she starts that.  Mood status: stable Satisfied with current treatment?: yes Symptom severity: moderate  Duration of current treatment : off and on Previous psychiatric medications: zoloft and hydroxyzine Depressed mood: no Anxious mood: yes Anhedonia: no Significant weight loss or gain: no Insomnia: no  Fatigue: yes Feelings of worthlessness or guilt: no Impaired concentration/indecisiveness: no Suicidal ideations: no Hopelessness: no Crying spells: no Depression screen [facility] 2/9 08/05/2019 12/04/2017 11/17/2017 10/08/2017 09/01/2017  Decreased Interest 1 0 1 2 1   Down, Depressed, Hopeless 1 0 1 3 1   PHQ - 2  Score 2 0 2 5 2   Altered sleeping 0 0 1 1 0  Tired, decreased energy 1 1 2 2 1   Change in appetite 0 0 1 0 1  Feeling bad or failure about yourself  0 0 2 1 2   Trouble concentrating 0 0 2 2 1   Moving slowly or fidgety/restless 0 0 0 0 0  Suicidal thoughts 0 0 0 0 0  PHQ-9 Score 3 1 10 11 7   Difficult doing work/chores Not difficult at all Not difficult at all Very difficult Very difficult Somewhat difficult   GAD 7 : Generalized Anxiety Score 08/05/2019 11/17/2017 10/08/2017 09/01/2017  Nervous, Anxious, on Edge 2 2 3 3   Control/stop worrying 2 2 3 3   Worry too much - different things 2 2 3 3   Trouble relaxing 1 2 3 1   Restless 0 2 0 0  Easily annoyed or irritable 1 2 2 1   Afraid - awful might happen 1 2 3  0  Total GAD 7 Score 9 14 17 11   Anxiety Difficulty Somewhat difficult Somewhat difficult Somewhat difficult Somewhat difficult     Relevant past medical, surgical, family and social history reviewed and updated as indicated. Interim medical history since our last visit reviewed. Allergies and medications reviewed and updated.  Review of Systems  Constitutional: Negative.   Respiratory: Negative.   Cardiovascular: Negative.   Gastrointestinal: Negative.   Genitourinary: Positive for dysuria, frequency, hematuria and vaginal discharge. Negative for decreased urine volume, difficulty urinating, dyspareunia, enuresis, flank pain, genital sores, menstrual problem, pelvic pain, urgency, vaginal bleeding and vaginal pain.  Musculoskeletal:  Negative.   Psychiatric/Behavioral: Negative.     Per HPI unless specifically indicated above     Objective:    BP 125/83 (BP Location: Left Arm, Patient Position: Sitting, Cuff Size: Normal)   Pulse 80   Temp 98.2 F (36.8 C) (Oral)   Ht 5' 4.08" (1.628 m)   Wt 139 lb (63 kg)   LMP 08/04/2019   SpO2 98%   BMI 23.80 kg/m   Wt Readings from Last 3 Encounters:  08/05/19 139 lb (63 kg)  12/11/17 134 lb 6.4 oz (61 kg)  12/04/17 134 lb  (60.8 kg)    Physical Exam Vitals and nursing note reviewed.  Constitutional:      General: She is not in acute distress.    Appearance: Normal appearance. She is not ill-appearing, toxic-appearing or diaphoretic.  HENT:     Head: Normocephalic and atraumatic.     Right Ear: External ear normal.     Left Ear: External ear normal.     Nose: Nose normal.     Mouth/Throat:     Mouth: Mucous membranes are moist.     Pharynx: Oropharynx is clear.  Eyes:     General: No scleral icterus.       Right eye: No discharge.        Left eye: No discharge.     Extraocular Movements: Extraocular movements intact.     Conjunctiva/sclera: Conjunctivae normal.     Pupils: Pupils are equal, round, and reactive to light.  Cardiovascular:     Rate and Rhythm: Normal rate and regular rhythm.     Pulses: Normal pulses.     Heart sounds: Normal heart sounds. No murmur. No friction rub. No gallop.   Pulmonary:     Effort: Pulmonary effort is normal. No respiratory distress.     Breath sounds: Normal breath sounds. No stridor. No wheezing, rhonchi or rales.  Chest:     Chest wall: No tenderness.  Musculoskeletal:        General: Normal range of motion.     Cervical back: Normal range of motion and neck supple.  Skin:    General: Skin is warm and dry.     Capillary Refill: Capillary refill takes less than 2 seconds.     Coloration: Skin is not jaundiced or pale.     Findings: No bruising, erythema, lesion or rash.  Neurological:     General: No focal deficit present.     Mental Status: She is alert and oriented to person, place, and time. Mental status is at baseline.  Psychiatric:        Mood and Affect: Mood normal.        Behavior: Behavior normal.        Thought Content: Thought content normal.        Judgment: Judgment normal.     Results for orders placed or performed in visit on 05/19/19  GC/Chlamydia Probe Amp   Specimen: Urine   UR  Result Value Ref Range   Chlamydia trachomatis,  NAA Positive (A) Negative   Neisseria Gonorrhoeae by PCR Negative Negative  WET PREP FOR TRICH, YEAST, CLUE   Specimen: Vaginal; Sterile Swab   STERILE SWAB  Result Value Ref Range   Trichomonas Exam Negative Negative   Yeast Exam Negative Negative   Clue Cell Exam Positive (A) Negative  HIV Antibody (routine testing w rflx)  Result Value Ref Range   HIV Screen 4th Generation wRfx Non Reactive Non Reactive  RPR  Result Value Ref Range   RPR Ser Ql Non Reactive Non Reactive  Hepatitis, Acute  Result Value Ref Range   Hep A IgM Negative Negative   Hepatitis B Surface Ag Negative Negative   Hep B C IgM Negative Negative   Hep C Virus Ab <0.1 0.0 - 0.9 s/co ratio  HSV(herpes simplex vrs) 1+2 ab-IgG  Result Value Ref Range   HSV 1 Glycoprotein G Ab, IgG <0.91 0.00 - 0.90 index   HSV 2 IgG, Type Spec 15.70 (H) 0.00 - 0.90 index      Assessment & Plan:   Problem List Items Addressed This Visit      Other   Depression, recurrent (HCC)    Doing OK. Has not started her zoloft. Will continue to monitor. Call if she wants to starts her zoloft.        Other Visit Diagnoses    Trichimoniasis    -  Primary   Will treat with flagyl. Call with any concerns or if not getting better. Continue to monitor.    Relevant Medications   metroNIDAZOLE (FLAGYL) 500 MG tablet   BV (bacterial vaginosis)       Will treat with flagyl. Call with any concerns or if not getting better. Continue to monitor.    Relevant Medications   metroNIDAZOLE (FLAGYL) 500 MG tablet   Routine screening for STI (sexually transmitted infection)       Labs drawn today. Await results.    Relevant Orders   WET PREP FOR TRICH, YEAST, CLUE   HIV Antibody (routine testing w rflx)   GC/Chlamydia Probe Amp   RPR   HSV(herpes simplex vrs) 1+2 ab-IgG   Hepatitis, Acute   Dysuria       Checking labs today. Await results.    Relevant Orders   UA/M w/rflx Culture, Routine       Follow up plan: Return if symptoms  worsen or fail to improve.

## 2019-08-06 LAB — HSV(HERPES SIMPLEX VRS) I + II AB-IGG
HSV 1 Glycoprotein G Ab, IgG: <0.91 index (ref 0.00–0.90)
HSV 2 IgG, Type Spec: 15.4 index — ABNORMAL HIGH (ref 0.00–0.90)

## 2019-08-06 LAB — HEPATITIS PANEL, ACUTE
Hep A IgM: NEGATIVE
Hep B C IgM: NEGATIVE
Hep C Virus Ab: <0.1 s/co ratio (ref 0.0–0.9)
Hepatitis B Surface Ag: NEGATIVE

## 2019-08-06 LAB — HIV ANTIBODY (ROUTINE TESTING W REFLEX): HIV Screen 4th Generation wRfx: NONREACTIVE

## 2019-08-06 LAB — RPR: RPR Ser Ql: NONREACTIVE

## 2019-08-07 LAB — UA/M W/RFLX CULTURE, ROUTINE
Bilirubin, UA: NEGATIVE
Glucose, UA: NEGATIVE
Ketones, UA: NEGATIVE
Nitrite, UA: NEGATIVE
Protein,UA: NEGATIVE
Specific Gravity, UA: 1.02 (ref 1.005–1.030)
Urobilinogen, Ur: 1 mg/dL (ref 0.2–1.0)
pH, UA: 7.5 (ref 5.0–7.5)

## 2019-08-07 LAB — MICROSCOPIC EXAMINATION: Bacteria, UA: NONE SEEN

## 2019-08-07 LAB — GC/CHLAMYDIA PROBE AMP
Chlamydia trachomatis, NAA: NEGATIVE
Neisseria Gonorrhoeae by PCR: NEGATIVE

## 2019-08-07 LAB — URINE CULTURE, REFLEX

## 2019-08-12 ENCOUNTER — Telehealth: Payer: Self-pay | Admitting: Family Medicine

## 2019-08-12 NOTE — Telephone Encounter (Signed)
Routing to provider to advise.  

## 2019-08-12 NOTE — Telephone Encounter (Signed)
Patient called and she is confused by lab note 08/05/19. She states she is unsure what was meant by the statement " you still have been exposed to herpes" She states she has never had symptoms and her partner has never had symptom. She states that she was told she has the "herpes that is not contagious". Patient is requesting call back for clarification.

## 2019-08-12 NOTE — Telephone Encounter (Signed)
Spoke with Brittany Page, she verbalized understanding that everything was the same from her last visit. However, she stated the nurse on the phone told her she should not have intercourse which confused her because she was told she was fine to have sex with her partner.

## 2019-08-12 NOTE — Telephone Encounter (Signed)
Nothing has changed. This is the same thing I talked to her about at her appointment. This is not high priority

## 2019-09-16 ENCOUNTER — Encounter: Payer: Self-pay | Admitting: Family Medicine

## 2019-09-19 ENCOUNTER — Ambulatory Visit (INDEPENDENT_AMBULATORY_CARE_PROVIDER_SITE_OTHER): Payer: PRIVATE HEALTH INSURANCE | Admitting: Family Medicine

## 2019-09-19 ENCOUNTER — Other Ambulatory Visit: Payer: Self-pay

## 2019-09-19 ENCOUNTER — Encounter: Payer: Self-pay | Admitting: Family Medicine

## 2019-09-19 VITALS — BP 127/79 | HR 78 | Temp 98.7°F | Wt 138.0 lb

## 2019-09-19 DIAGNOSIS — A599 Trichomoniasis, unspecified: Secondary | ICD-10-CM

## 2019-09-19 DIAGNOSIS — N760 Acute vaginitis: Secondary | ICD-10-CM

## 2019-09-19 DIAGNOSIS — N898 Other specified noninflammatory disorders of vagina: Secondary | ICD-10-CM | POA: Diagnosis not present

## 2019-09-19 DIAGNOSIS — B9689 Other specified bacterial agents as the cause of diseases classified elsewhere: Secondary | ICD-10-CM

## 2019-09-19 LAB — WET PREP FOR TRICH, YEAST, CLUE
Clue Cell Exam: POSITIVE — AB
Trichomonas Exam: POSITIVE — AB
Yeast Exam: NEGATIVE

## 2019-09-19 MED ORDER — METRONIDAZOLE 500 MG PO TABS: 2000.0000 mg | ORAL_TABLET | Freq: Once | ORAL | 0 refills | Status: AC

## 2019-09-19 NOTE — Progress Notes (Signed)
BP 127/79   Pulse 78   Temp 98.7 F (37.1 C) (Oral)   Wt 138 lb (62.6 kg)   SpO2 98%   BMI 23.63 kg/m    Subjective:    Patient ID: Brittany Page, female    DOB: [birth year], 29 y.o.   MRN: [id number]  HPI: Brittany Page is a 29 y.o. female  Chief Complaint  Patient presents with  . Vaginal Discharge    x about 2 weeks   VAGINAL DISCHARGE Duration: 2 weeks Discharge description: clear  Pruritus: yes Dysuria: yes Malodorous: yes Urinary frequency: no Fevers: no Abdominal pain: yes  Sexual activity: practicing safe sex History of sexually transmitted diseases: yes Recent antibiotic use: yes Context: recurrent BV  Treatments attempted: none  Relevant past medical, surgical, family and social history reviewed and updated as indicated. Interim medical history since our last visit reviewed. Allergies and medications reviewed and updated.  Review of Systems  Per HPI unless specifically indicated above     Objective:    BP 127/79   Pulse 78   Temp 98.7 F (37.1 C) (Oral)   Wt 138 lb (62.6 kg)   SpO2 98%   BMI 23.63 kg/m   Wt Readings from Last 3 Encounters:  09/19/19 138 lb (62.6 kg)  08/05/19 139 lb (63 kg)  12/11/17 134 lb 6.4 oz (61 kg)    Physical Exam Vitals and nursing note reviewed.  Constitutional:      General: She is not in acute distress.    Appearance: Normal appearance. She is not ill-appearing, toxic-appearing or diaphoretic.  HENT:     Head: Normocephalic and atraumatic.     Right Ear: External ear normal.     Left Ear: External ear normal.     Nose: Nose normal.     Mouth/Throat:     Mouth: Mucous membranes are moist.     Pharynx: Oropharynx is clear.  Eyes:     General: No scleral icterus.       Right eye: No discharge.        Left eye: No discharge.     Extraocular Movements: Extraocular movements intact.     Conjunctiva/sclera: Conjunctivae normal.     Pupils: Pupils are equal, round, and reactive to light.  Cardiovascular:      Rate and Rhythm: Normal rate and regular rhythm.     Pulses: Normal pulses.     Heart sounds: Normal heart sounds. No murmur heard.  No friction rub. No gallop.   Pulmonary:     Effort: Pulmonary effort is normal. No respiratory distress.     Breath sounds: Normal breath sounds. No stridor. No wheezing, rhonchi or rales.  Chest:     Chest wall: No tenderness.  Musculoskeletal:        General: Normal range of motion.     Cervical back: Normal range of motion and neck supple.  Skin:    General: Skin is warm and dry.     Capillary Refill: Capillary refill takes less than 2 seconds.     Coloration: Skin is not jaundiced or pale.     Findings: No bruising, erythema, lesion or rash.  Neurological:     General: No focal deficit present.     Mental Status: She is alert and oriented to person, place, and time. Mental status is at baseline.  Psychiatric:        Mood and Affect: Mood normal.        Behavior: Behavior normal.  Thought Content: Thought content normal.        Judgment: Judgment normal.     Results for orders placed or performed in visit on 08/05/19  WET PREP FOR TRICH, YEAST, CLUE   Specimen: Vaginal; Sterile Swab   STERILE SWAB  Result Value Ref Range   Trichomonas Exam Positive (A) Negative   Yeast Exam Negative Negative   Clue Cell Exam Positive (A) Negative  GC/Chlamydia Probe Amp   Specimen: Urine   UR  Result Value Ref Range   Chlamydia trachomatis, NAA Negative Negative   Neisseria Gonorrhoeae by PCR Negative Negative  Microscopic Examination   URINE  Result Value Ref Range   WBC, UA 0-5 0 - 5 /hpf   RBC 0-2 0 - 2 /hpf   Epithelial Cells (non renal) 0-10 0 - 10 /hpf   Bacteria, UA None seen None seen/Few  Urine Culture, Reflex   URINE  Result Value Ref Range   Urine Culture, Routine Final report    Organism ID, Bacteria Comment   HIV Antibody (routine testing w rflx)  Result Value Ref Range   HIV Screen 4th Generation wRfx Non Reactive Non  Reactive  RPR  Result Value Ref Range   RPR Ser Ql Non Reactive Non Reactive  HSV(herpes simplex vrs) 1+2 ab-IgG  Result Value Ref Range   HSV 1 Glycoprotein G Ab, IgG <0.91 0.00 - 0.90 index   HSV 2 IgG, Type Spec 15.40 (H) 0.00 - 0.90 index  Hepatitis, Acute  Result Value Ref Range   Hep A IgM Negative Negative   Hepatitis B Surface Ag Negative Negative   Hep B C IgM Negative Negative   Hep C Virus Ab <0.1 0.0 - 0.9 s/co ratio  UA/M w/rflx Culture, Routine   Specimen: Urine   URINE  Result Value Ref Range   Specific Gravity, UA 1.020 1.005 - 1.030   pH, UA 7.5 5.0 - 7.5   Color, UA Yellow Yellow   Appearance Ur Clear Clear   Leukocytes,UA Trace (A) Negative   Protein,UA Negative Negative/Trace   Glucose, UA Negative Negative   Ketones, UA Negative Negative   RBC, UA Trace (A) Negative   Bilirubin, UA Negative Negative   Urobilinogen, Ur 1.0 0.2 - 1.0 mg/dL   Nitrite, UA Negative Negative   Microscopic Examination See below:    Urinalysis Reflex Comment       Assessment & Plan:   Problem List Items Addressed This Visit    None    Visit Diagnoses    Trichimoniasis    -  Primary   Will treat with flagyl 2000 x1. Discussed cleaning toys and refraining from sexual activity until her partner has been treated. Test of Cure on Friday.   Relevant Medications   metroNIDAZOLE (FLAGYL) 500 MG tablet   Vaginal discharge       + Trich and BV   Relevant Orders   WET PREP FOR TRICH, YEAST, CLUE   WET PREP FOR TRICH, YEAST, CLUE   BV (bacterial vaginosis)       Will treat with flagyl. Call with any concerns.    Relevant Medications   metroNIDAZOLE (FLAGYL) 500 MG tablet       Follow up plan: Return if symptoms worsen or fail to improve.

## 2019-09-23 ENCOUNTER — Other Ambulatory Visit: Payer: PRIVATE HEALTH INSURANCE

## 2019-09-23 ENCOUNTER — Other Ambulatory Visit: Payer: Self-pay

## 2019-09-23 DIAGNOSIS — N898 Other specified noninflammatory disorders of vagina: Secondary | ICD-10-CM

## 2019-09-24 ENCOUNTER — Encounter: Payer: Self-pay | Admitting: Family Medicine

## 2019-09-24 LAB — WET PREP FOR TRICH, YEAST, CLUE
Clue Cell Exam: POSITIVE — AB
Trichomonas Exam: NEGATIVE
Yeast Exam: NEGATIVE

## 2019-09-27 ENCOUNTER — Telehealth: Payer: Self-pay | Admitting: Family Medicine

## 2019-09-27 ENCOUNTER — Other Ambulatory Visit: Payer: Self-pay | Admitting: Family Medicine

## 2019-09-27 MED ORDER — CLINDAMYCIN PHOSPHATE 2 % VA CREA: 1.0000 | TOPICAL_CREAM | Freq: Every day | VAGINAL | 0 refills | Status: AC

## 2019-09-27 MED ORDER — CLINDAMYCIN PHOSPHATE 2 % VA CREA: 1.0000 | TOPICAL_CREAM | Freq: Every day | VAGINAL | 0 refills | Status: DC

## 2019-09-27 NOTE — Telephone Encounter (Signed)
Medication Refill - Medication: clindamycin (CLEOCIN) 2 % vaginal cream (Patient stated that medication was $43 at pharmacy and she would like medication sent to a different pharmacy due to cost of medication. Patient would like a callback once medication has been sent to new pharmacy.)   Has the patient contacted their pharmacy? yes (Agent: If no, request that the patient contact the pharmacy for the refill.) (Agent: If yes, when and what did the pharmacy advise?)contact pcp  Preferred Pharmacy (with phone number or street name): [facility] [address] - [place], [place] - [address] Phone:  [phone]  Fax:  [phone]       Agent: Please be advised that RX refills may take up to 3 business days. We ask that you follow-up with your pharmacy.

## 2019-10-17 ENCOUNTER — Encounter: Payer: Self-pay | Admitting: Family Medicine

## 2019-12-30 ENCOUNTER — Ambulatory Visit: Payer: Self-pay | Admitting: *Deleted

## 2019-12-30 NOTE — Telephone Encounter (Signed)
3rd attempt to contact pt; left message on voicemail; she is seen by Dr Olevia Perches, Dossie Arbour; will route to office for final disposition.

## 2020-06-26 ENCOUNTER — Encounter: Payer: Self-pay | Admitting: Family Medicine

## 2020-06-28 ENCOUNTER — Other Ambulatory Visit: Payer: Self-pay

## 2020-06-28 ENCOUNTER — Encounter: Payer: Self-pay | Admitting: Family Medicine

## 2020-06-28 ENCOUNTER — Ambulatory Visit (INDEPENDENT_AMBULATORY_CARE_PROVIDER_SITE_OTHER): Payer: BC Managed Care – PPO | Admitting: Family Medicine

## 2020-06-28 VITALS — BP 108/69 | HR 80 | Wt 141.0 lb

## 2020-06-28 DIAGNOSIS — N760 Acute vaginitis: Secondary | ICD-10-CM | POA: Diagnosis not present

## 2020-06-28 DIAGNOSIS — N898 Other specified noninflammatory disorders of vagina: Secondary | ICD-10-CM

## 2020-06-28 DIAGNOSIS — B9689 Other specified bacterial agents as the cause of diseases classified elsewhere: Secondary | ICD-10-CM | POA: Diagnosis not present

## 2020-06-28 MED ORDER — METRONIDAZOLE 500 MG PO TABS: 500.0000 mg | ORAL_TABLET | Freq: Two times a day (BID) | ORAL | 0 refills | Status: DC

## 2020-06-28 NOTE — Progress Notes (Signed)
BP 108/69   Pulse 80   Wt 141 lb (64 kg)   SpO2 98%   BMI 24.15 kg/m    Subjective:    Patient ID: Brittany Page, female    DOB: 1991-01-20, 30 y.o.   MRN: [id number]  HPI: Brittany Page is a 30 y.o. female  Chief Complaint  Patient presents with  . Vaginal Discharge    Patient states she is having vaginal discharge for a week. States she is having vaginal itching.    VAGINAL DISCHARGE Duration: about a week Discharge description: mucous  Pruritus: yes Dysuria: no Malodorous: yes Urinary frequency: no Fevers: no Abdominal pain: yes  Sexual activity: practicing safe sex History of sexually transmitted diseases: no Recent antibiotic use: no Context: just got back from the beach Treatments attempted: none  Relevant past medical, surgical, family and social history reviewed and updated as indicated. Interim medical history since our last visit reviewed. Allergies and medications reviewed and updated.  Review of Systems  Constitutional: Negative.   Respiratory: Negative.   Cardiovascular: Negative.   Gastrointestinal: Negative.   Genitourinary: Positive for vaginal discharge. Negative for decreased urine volume, difficulty urinating, dyspareunia, dysuria, enuresis, flank pain, frequency, genital sores, hematuria, menstrual problem, pelvic pain, urgency, vaginal bleeding and vaginal pain.  Musculoskeletal: Negative.   Psychiatric/Behavioral: Negative.     Per HPI unless specifically indicated above     Objective:    BP 108/69   Pulse 80   Wt 141 lb (64 kg)   SpO2 98%   BMI 24.15 kg/m   Wt Readings from Last 3 Encounters:  06/28/20 141 lb (64 kg)  09/19/19 138 lb (62.6 kg)  08/05/19 139 lb (63 kg)    Physical Exam Vitals and nursing note reviewed.  Constitutional:      General: She is not in acute distress.    Appearance: Normal appearance. She is not ill-appearing, toxic-appearing or diaphoretic.  HENT:     Head: Normocephalic and atraumatic.     Right  Ear: External ear normal.     Left Ear: External ear normal.     Nose: Nose normal.     Mouth/Throat:     Mouth: Mucous membranes are moist.     Pharynx: Oropharynx is clear.  Eyes:     General: No scleral icterus.       Right eye: No discharge.        Left eye: No discharge.     Extraocular Movements: Extraocular movements intact.     Conjunctiva/sclera: Conjunctivae normal.     Pupils: Pupils are equal, round, and reactive to light.  Cardiovascular:     Rate and Rhythm: Normal rate and regular rhythm.     Pulses: Normal pulses.     Heart sounds: Normal heart sounds. No murmur heard. No friction rub. No gallop.   Pulmonary:     Effort: Pulmonary effort is normal. No respiratory distress.     Breath sounds: Normal breath sounds. No stridor. No wheezing, rhonchi or rales.  Chest:     Chest wall: No tenderness.  Musculoskeletal:        General: Normal range of motion.     Cervical back: Normal range of motion and neck supple.  Skin:    General: Skin is warm and dry.     Capillary Refill: Capillary refill takes less than 2 seconds.     Coloration: Skin is not jaundiced or pale.     Findings: No bruising, erythema, lesion or rash.  Neurological:  General: No focal deficit present.     Mental Status: She is alert and oriented to person, place, and time. Mental status is at baseline.  Psychiatric:        Mood and Affect: Mood normal.        Behavior: Behavior normal.        Thought Content: Thought content normal.        Judgment: Judgment normal.     Results for orders placed or performed in visit on 09/23/19  WET PREP FOR TRICH, YEAST, CLUE   Specimen: Vaginal; Sterile Swab   Sterile Swab  Result Value Ref Range   Trichomonas Exam Negative Negative   Yeast Exam Negative Negative   Clue Cell Exam Positive (A) Negative      Assessment & Plan:   Problem List Items Addressed This Visit   None   Visit Diagnoses    BV (bacterial vaginosis)    -  Primary   Will  treat with flagyl. Call with any conerns. Continue to monitor.    Relevant Medications   metroNIDAZOLE (FLAGYL) 500 MG tablet   Vaginal discharge       + Clue cells.    Relevant Orders   WET PREP FOR TRICH, YEAST, CLUE       Follow up plan: Return 3-6 months physical.

## 2020-06-29 LAB — WET PREP FOR TRICH, YEAST, CLUE
Clue Cell Exam: POSITIVE — AB
Trichomonas Exam: NEGATIVE
Yeast Exam: NEGATIVE

## 2020-07-17 ENCOUNTER — Ambulatory Visit: Payer: PRIVATE HEALTH INSURANCE | Admitting: Family Medicine

## 2020-07-31 ENCOUNTER — Other Ambulatory Visit: Payer: Self-pay

## 2020-07-31 ENCOUNTER — Ambulatory Visit (INDEPENDENT_AMBULATORY_CARE_PROVIDER_SITE_OTHER): Payer: BC Managed Care – PPO | Admitting: Family Medicine

## 2020-07-31 ENCOUNTER — Encounter: Payer: Self-pay | Admitting: Family Medicine

## 2020-07-31 VITALS — BP 130/68 | HR 87 | Temp 98.5°F | Wt 141.0 lb

## 2020-07-31 DIAGNOSIS — B3741 Candidal cystitis and urethritis: Secondary | ICD-10-CM | POA: Diagnosis not present

## 2020-07-31 DIAGNOSIS — N3001 Acute cystitis with hematuria: Secondary | ICD-10-CM

## 2020-07-31 DIAGNOSIS — R300 Dysuria: Secondary | ICD-10-CM

## 2020-07-31 MED ORDER — CIPROFLOXACIN HCL 500 MG PO TABS: 500.0000 mg | ORAL_TABLET | Freq: Two times a day (BID) | ORAL | 0 refills | Status: AC

## 2020-07-31 MED ORDER — FLUCONAZOLE 150 MG PO TABS: 150.0000 mg | ORAL_TABLET | Freq: Every day | ORAL | 0 refills | Status: DC

## 2020-07-31 NOTE — Progress Notes (Signed)
BP 130/68   Pulse 87   Temp 98.5 F (36.9 C)   Wt 141 lb (64 kg)   SpO2 98%   BMI 24.15 kg/m    Subjective:    Patient ID: Brittany Page, female    DOB: [birth year], 30 y.o.   MRN: [id number]  HPI: Brittany Page is a 30 y.o. female  Chief Complaint  Patient presents with  . Urinary Tract Infection    Patient states she is having burning with urination. Has vaginal itching and discharge. Patient states she has been having symptoms for a few days    URINARY SYMPTOMS Duration: 2 days Dysuria: burning Urinary frequency: yes Urgency: yes Small volume voids: yes Symptom severity: moderate Urinary incontinence: yes Foul odor: yes Hematuria: no Abdominal pain: no Back pain: no Suprapubic pain/pressure: no Flank pain: no Fever:  no Vomiting: no Relief with cranberry juice: no Relief with pyridium: no Status: worse Previous urinary tract infection: yes Recurrent urinary tract infection: no Sexual activity: practicing safe sex Vaginal discharge: no Treatments attempted: increasing fluids  Relevant past medical, surgical, family and social history reviewed and updated as indicated. Interim medical history since our last visit reviewed. Allergies and medications reviewed and updated.  Review of Systems  Constitutional: Negative.   Respiratory: Negative.   Cardiovascular: Negative.   Gastrointestinal: Negative.   Genitourinary: Positive for dysuria, frequency and urgency. Negative for decreased urine volume, difficulty urinating, dyspareunia, enuresis, flank pain, genital sores, hematuria, menstrual problem, pelvic pain, vaginal bleeding, vaginal discharge and vaginal pain.  Musculoskeletal: Negative.   Skin: Negative.   Psychiatric/Behavioral: Negative.     Per HPI unless specifically indicated above     Objective:    BP 130/68   Pulse 87   Temp 98.5 F (36.9 C)   Wt 141 lb (64 kg)   SpO2 98%   BMI 24.15 kg/m   Wt Readings from Last 3 Encounters:  07/31/20  141 lb (64 kg)  06/28/20 141 lb (64 kg)  09/19/19 138 lb (62.6 kg)    Physical Exam Vitals and nursing note reviewed.  Constitutional:      General: She is not in acute distress.    Appearance: Normal appearance. She is not ill-appearing, toxic-appearing or diaphoretic.  HENT:     Head: Normocephalic and atraumatic.     Right Ear: External ear normal.     Left Ear: External ear normal.     Nose: Nose normal.     Mouth/Throat:     Mouth: Mucous membranes are moist.     Pharynx: Oropharynx is clear.  Eyes:     General: No scleral icterus.       Right eye: No discharge.        Left eye: No discharge.     Extraocular Movements: Extraocular movements intact.     Conjunctiva/sclera: Conjunctivae normal.     Pupils: Pupils are equal, round, and reactive to light.  Cardiovascular:     Rate and Rhythm: Normal rate and regular rhythm.     Pulses: Normal pulses.     Heart sounds: Normal heart sounds. No murmur heard. No friction rub. No gallop.   Pulmonary:     Effort: Pulmonary effort is normal. No respiratory distress.     Breath sounds: Normal breath sounds. No stridor. No wheezing, rhonchi or rales.  Chest:     Chest wall: No tenderness.  Musculoskeletal:        General: Normal range of motion.     Cervical back:  Normal range of motion and neck supple.  Skin:    General: Skin is warm and dry.     Capillary Refill: Capillary refill takes less than 2 seconds.     Coloration: Skin is not jaundiced or pale.     Findings: No bruising, erythema, lesion or rash.  Neurological:     General: No focal deficit present.     Mental Status: She is alert and oriented to person, place, and time. Mental status is at baseline.  Psychiatric:        Mood and Affect: Mood normal.        Behavior: Behavior normal.        Thought Content: Thought content normal.        Judgment: Judgment normal.     Results for orders placed or performed in visit on 06/28/20  WET PREP FOR TRICH, YEAST, CLUE    Specimen: Sterile Swab   Sterile Swab  Result Value Ref Range   Trichomonas Exam Negative Negative   Yeast Exam Negative Negative   Clue Cell Exam Positive (A) Negative      Assessment & Plan:   Problem List Items Addressed This Visit   None   Visit Diagnoses    Acute cystitis with hematuria    -  Primary   Will treat with cipro. Call with any concerns.    Yeast cystitis       Will treat with diflucan. Call with any concerns.    Relevant Medications   fluconazole (DIFLUCAN) 150 MG tablet   Dysuria       +UTI and and yeast- will treat. Call with any concerns.    Relevant Orders   Urinalysis, Routine w reflex microscopic   WET PREP FOR TRICH, YEAST, CLUE       Follow up plan: Return if symptoms worsen or fail to improve.

## 2020-08-01 LAB — URINALYSIS, ROUTINE W REFLEX MICROSCOPIC
Bilirubin, UA: NEGATIVE
Glucose, UA: NEGATIVE
Ketones, UA: NEGATIVE
Nitrite, UA: NEGATIVE
Specific Gravity, UA: 1.025 (ref 1.005–1.030)
Urobilinogen, Ur: 0.2 mg/dL (ref 0.2–1.0)
pH, UA: 7 (ref 5.0–7.5)

## 2020-08-01 LAB — MICROSCOPIC EXAMINATION

## 2020-08-01 LAB — WET PREP FOR TRICH, YEAST, CLUE
Clue Cell Exam: NEGATIVE
Trichomonas Exam: NEGATIVE
Yeast Exam: POSITIVE — AB

## 2020-08-31 ENCOUNTER — Encounter: Payer: Self-pay | Admitting: Family Medicine

## 2020-08-31 ENCOUNTER — Other Ambulatory Visit: Payer: Self-pay

## 2020-08-31 ENCOUNTER — Ambulatory Visit (INDEPENDENT_AMBULATORY_CARE_PROVIDER_SITE_OTHER): Payer: BC Managed Care – PPO | Admitting: Family Medicine

## 2020-08-31 VITALS — BP 99/64 | HR 82 | Temp 98.8°F | Wt 141.0 lb

## 2020-08-31 DIAGNOSIS — B9689 Other specified bacterial agents as the cause of diseases classified elsewhere: Secondary | ICD-10-CM | POA: Diagnosis not present

## 2020-08-31 DIAGNOSIS — Z113 Encounter for screening for infections with a predominantly sexual mode of transmission: Secondary | ICD-10-CM | POA: Diagnosis not present

## 2020-08-31 DIAGNOSIS — R300 Dysuria: Secondary | ICD-10-CM | POA: Diagnosis not present

## 2020-08-31 DIAGNOSIS — N760 Acute vaginitis: Secondary | ICD-10-CM | POA: Diagnosis not present

## 2020-08-31 LAB — URINALYSIS, ROUTINE W REFLEX MICROSCOPIC
Bilirubin, UA: NEGATIVE
Glucose, UA: NEGATIVE
Ketones, UA: NEGATIVE
Nitrite, UA: NEGATIVE
RBC, UA: NEGATIVE
Specific Gravity, UA: 1.025 (ref 1.005–1.030)
Urobilinogen, Ur: 1 mg/dL (ref 0.2–1.0)
pH, UA: 7 (ref 5.0–7.5)

## 2020-08-31 LAB — MICROSCOPIC EXAMINATION: RBC, Urine: NONE SEEN /hpf (ref 0–2)

## 2020-08-31 LAB — WET PREP FOR TRICH, YEAST, CLUE
Clue Cell Exam: POSITIVE — AB
Trichomonas Exam: NEGATIVE
Yeast Exam: NEGATIVE

## 2020-08-31 MED ORDER — METRONIDAZOLE 500 MG PO TABS: 500.0000 mg | ORAL_TABLET | Freq: Two times a day (BID) | ORAL | 0 refills | Status: DC

## 2020-08-31 NOTE — Progress Notes (Signed)
BP 99/64   Pulse 82   Temp 98.8 F (37.1 C)   Wt 141 lb (64 kg)   SpO2 97%   BMI 24.15 kg/m    Subjective:    Patient ID: Brittany Page, female    DOB: Sep 02, 1990, 30 y.o.   MRN: [id number]  HPI: Brittany Page is a 30 y.o. female  Chief Complaint  Patient presents with   Urinary Tract Infection    Patient states she is having urinary frequency and itching    URINARY SYMPTOMS Duration: 2 weeks Dysuria: burning Urinary frequency: yes Urgency: no Small volume voids: yes Symptom severity: moderate Urinary incontinence: no Foul odor: no Hematuria: no Abdominal pain: no Back pain: no Suprapubic pain/pressure: no Flank pain: no Fever:  no Vomiting: no Relief with cranberry juice: no Relief with pyridium: no Status: worse Previous urinary tract infection: yes Recurrent urinary tract infection: no Sexual activity: monogomous History of sexually transmitted disease: yes Penile discharge: no Treatments attempted: increasing fluids    Relevant past medical, surgical, family and social history reviewed and updated as indicated. Interim medical history since our last visit reviewed. Allergies and medications reviewed and updated.  Review of Systems  Constitutional: Negative.   Respiratory: Negative.    Cardiovascular: Negative.   Gastrointestinal: Negative.   Genitourinary:  Positive for dysuria, frequency, urgency and vaginal discharge. Negative for decreased urine volume, difficulty urinating, dyspareunia, enuresis, flank pain, genital sores, hematuria, menstrual problem, pelvic pain, vaginal bleeding and vaginal pain.  Musculoskeletal: Negative.   Psychiatric/Behavioral: Negative.     Per HPI unless specifically indicated above     Objective:    BP 99/64   Pulse 82   Temp 98.8 F (37.1 C)   Wt 141 lb (64 kg)   SpO2 97%   BMI 24.15 kg/m   Wt Readings from Last 3 Encounters:  08/31/20 141 lb (64 kg)  07/31/20 141 lb (64 kg)  06/28/20 141 lb (64 kg)     Physical Exam Vitals and nursing note reviewed.  Constitutional:      General: She is not in acute distress.    Appearance: Normal appearance. She is not ill-appearing, toxic-appearing or diaphoretic.  HENT:     Head: Normocephalic and atraumatic.     Right Ear: External ear normal.     Left Ear: External ear normal.     Nose: Nose normal.     Mouth/Throat:     Mouth: Mucous membranes are moist.     Pharynx: Oropharynx is clear.  Eyes:     General: No scleral icterus.       Right eye: No discharge.        Left eye: No discharge.     Extraocular Movements: Extraocular movements intact.     Conjunctiva/sclera: Conjunctivae normal.     Pupils: Pupils are equal, round, and reactive to light.  Cardiovascular:     Rate and Rhythm: Normal rate and regular rhythm.     Pulses: Normal pulses.     Heart sounds: Normal heart sounds. No murmur heard.   No friction rub. No gallop.  Pulmonary:     Effort: Pulmonary effort is normal. No respiratory distress.     Breath sounds: Normal breath sounds. No stridor. No wheezing, rhonchi or rales.  Chest:     Chest wall: No tenderness.  Musculoskeletal:        General: Normal range of motion.     Cervical back: Normal range of motion and neck supple.  Skin:  General: Skin is warm and dry.     Capillary Refill: Capillary refill takes less than 2 seconds.     Coloration: Skin is not jaundiced or pale.     Findings: No bruising, erythema, lesion or rash.  Neurological:     General: No focal deficit present.     Mental Status: She is alert and oriented to person, place, and time. Mental status is at baseline.  Psychiatric:        Mood and Affect: Mood normal.        Behavior: Behavior normal.        Thought Content: Thought content normal.        Judgment: Judgment normal.    Results for orders placed or performed in visit on 07/31/20  WET PREP FOR TRICH, YEAST, CLUE   Specimen: Sterile Swab   Sterile Swab  Result Value Ref Range    Trichomonas Exam Negative Negative   Yeast Exam Positive (A) Negative   Clue Cell Exam Negative Negative  Microscopic Examination   Urine  Result Value Ref Range   WBC, UA 6-10 (A) 0 - 5 /hpf   RBC 3-10 (A) 0 - 2 /hpf   Epithelial Cells (non renal) 0-10 0 - 10 /hpf   Mucus, UA Present (A) Not Estab.   Bacteria, UA Few (A) None seen/Few  Urinalysis, Routine w reflex microscopic  Result Value Ref Range   Specific Gravity, UA 1.025 1.005 - 1.030   pH, UA 7.0 5.0 - 7.5   Color, UA Yellow Yellow   Appearance Ur Cloudy (A) Clear   Leukocytes,UA 2+ (A) Negative   Protein,UA Trace (A) Negative/Trace   Glucose, UA Negative Negative   Ketones, UA Negative Negative   RBC, UA 2+ (A) Negative   Bilirubin, UA Negative Negative   Urobilinogen, Ur 0.2 0.2 - 1.0 mg/dL   Nitrite, UA Negative Negative   Microscopic Examination See below:       Assessment & Plan:   Problem List Items Addressed This Visit   None Visit Diagnoses     BV (bacterial vaginosis)    -  Primary   Will treat with metronidazole. Call with any concerns.   Relevant Medications   metroNIDAZOLE (FLAGYL) 500 MG tablet   Dysuria       UA showed 1+ leuks- will send culture. + clue cells.    Relevant Orders   WET PREP FOR TRICH, YEAST, CLUE   Urinalysis, Routine w reflex microscopic   Urine Culture   Routine screening for STI (sexually transmitted infection)       Partner has also had "uti"- we will check STI panel.   Relevant Orders   HIV Antibody (routine testing w rflx)   GC/Chlamydia Probe Amp   RPR   HSV(herpes simplex vrs) 1+2 ab-IgG   Acute Viral Hepatitis (HAV, HBV, HCV)        Follow up plan: Return if symptoms worsen or fail to improve.

## 2020-09-01 LAB — HIV ANTIBODY (ROUTINE TESTING W REFLEX): HIV Screen 4th Generation wRfx: NONREACTIVE

## 2020-09-01 LAB — HCV INTERPRETATION

## 2020-09-01 LAB — ACUTE VIRAL HEPATITIS (HAV, HBV, HCV)
HCV Ab: <0.1 s/co ratio (ref 0.0–0.9)
Hep A IgM: NEGATIVE
Hep B C IgM: NEGATIVE
Hepatitis B Surface Ag: NEGATIVE

## 2020-09-01 LAB — HSV(HERPES SIMPLEX VRS) I + II AB-IGG
HSV 1 Glycoprotein G Ab, IgG: <0.91 index (ref 0.00–0.90)
HSV 2 IgG, Type Spec: 19.1 index — ABNORMAL HIGH (ref 0.00–0.90)

## 2020-09-01 LAB — RPR: RPR Ser Ql: NONREACTIVE

## 2020-09-02 LAB — GC/CHLAMYDIA PROBE AMP
Chlamydia trachomatis, NAA: NEGATIVE
Neisseria Gonorrhoeae by PCR: NEGATIVE

## 2020-09-03 LAB — URINE CULTURE

## 2020-09-05 ENCOUNTER — Ambulatory Visit: Payer: Self-pay

## 2020-09-05 NOTE — Telephone Encounter (Signed)
Pt. States she has "miosis disease and would that stop me from having sex?" Do not find this in her chart. Offered to send message to PCP for clarification. Declines.

## 2020-09-05 NOTE — Telephone Encounter (Signed)
Pt has a question seeking nursing advise. She wants to know if you have miosis disease can you still have intercourse. Please advise  Best contact: [phone]  Called from community line

## 2020-09-12 ENCOUNTER — Ambulatory Visit (INDEPENDENT_AMBULATORY_CARE_PROVIDER_SITE_OTHER): Payer: BC Managed Care – PPO | Admitting: Obstetrics and Gynecology

## 2020-09-12 ENCOUNTER — Other Ambulatory Visit (HOSPITAL_COMMUNITY)
Admission: RE | Admit: 2020-09-12 | Discharge: 2020-09-12 | Disposition: A | Discharge status: Home / Self Care | Payer: BC Managed Care – PPO | Source: Ambulatory Visit | Attending: Obstetrics and Gynecology | Admitting: Obstetrics and Gynecology

## 2020-09-12 ENCOUNTER — Encounter: Payer: Self-pay | Admitting: Obstetrics and Gynecology

## 2020-09-12 ENCOUNTER — Other Ambulatory Visit: Payer: Self-pay

## 2020-09-12 VITALS — BP 126/87 | HR 85 | Ht 64.08 in | Wt 132.3 lb

## 2020-09-12 DIAGNOSIS — Z124 Encounter for screening for malignant neoplasm of cervix: Secondary | ICD-10-CM | POA: Diagnosis not present

## 2020-09-12 DIAGNOSIS — Z30432 Encounter for removal of intrauterine contraceptive device: Secondary | ICD-10-CM | POA: Diagnosis not present

## 2020-09-12 NOTE — Addendum Note (Signed)
Addended by: Silvano Bilis on: 09/12/2020 04:41 PM   Modules accepted: Orders

## 2020-09-12 NOTE — Progress Notes (Signed)
     [facility] [address] [place], [place]  [address] Phone:  [phone]   Fax:  [phone]  GYNECOLOGY OFFICE PROCEDURE NOTE  Brittany Page is a 30 y.o. G2P1101 here for Mirena IUD removal. No GYN concerns.  Notes that she is not sexually active, and desires to be off hormones at this time.  Also desires more predictable cycles.  Wants to give her body a break. Last pap smear was on 09/04/2015 and was normal.  IUD Removal  Patient identified, informed consent performed, consent signed.  Patient was in the dorsal lithotomy position, normal external genitalia was noted.  A speculum was placed in the patient's vagina, normal discharge was noted, no lesions. The cervix was visualized, no lesions, no abnormal discharge.  The strings of the IUD were grasped and pulled using ring forceps. The IUD was removed in its entirety.  Patient tolerated the procedure well.  A pap smear wad done as patient is overdue for cervical cancer screening.   Patient will use abstinence (condoms if she becomes active) for contraception.  Routine preventative health maintenance measures emphasized. Up to date, sees PCP routinely.    Hildred Laser, MD [facility]

## 2020-09-12 NOTE — Patient Instructions (Signed)
Health Maintenance, Female Adopting a healthy lifestyle and getting preventive care are important in promoting health and wellness. Ask your health care provider about: The right schedule for you to have regular tests and exams. Things you can do on your own to prevent diseases and keep yourself healthy. What should I know about diet, weight, and exercise? Eat a healthy diet  Eat a diet that includes plenty of vegetables, fruits, low-fat dairy products, and lean protein. Do not eat a lot of foods that are high in solid fats, added sugars, or sodium.  Maintain a healthy weight Body mass index (BMI) is used to identify weight problems. It estimates body fat based on height and weight. Your health care provider can help determineyour BMI and help you achieve or maintain a healthy weight. Get regular exercise Get regular exercise. This is one of the most important things you can do for your health. Most adults should: Exercise for at least 150 minutes each week. The exercise should increase your heart rate and make you sweat (moderate-intensity exercise). Do strengthening exercises at least twice a week. This is in addition to the moderate-intensity exercise. Spend less time sitting. Even light physical activity can be beneficial. Watch cholesterol and blood lipids Have your blood tested for lipids and cholesterol at 31 years of age, then havethis test every 5 years. Have your cholesterol levels checked more often if: Your lipid or cholesterol levels are high. You are older than 31 years of age. You are at high risk for heart disease. What should I know about cancer screening? Depending on your health history and family history, you may need to have cancer screening at various ages. This may include screening for: Breast cancer. Cervical cancer. Colorectal cancer. Skin cancer. Lung cancer. What should I know about heart disease, diabetes, and high blood pressure? Blood pressure and heart  disease High blood pressure causes heart disease and increases the risk of stroke. This is more likely to develop in people who have high blood pressure readings, are of African descent, or are overweight. Have your blood pressure checked: Every 3-5 years if you are 18-39 years of age. Every year if you are 40 years old or older. Diabetes Have regular diabetes screenings. This checks your fasting blood sugar level. Have the screening done: Once every three years after age 40 if you are at a normal weight and have a low risk for diabetes. More often and at a younger age if you are overweight or have a high risk for diabetes. What should I know about preventing infection? Hepatitis B If you have a higher risk for hepatitis B, you should be screened for this virus. Talk with your health care provider to find out if you are at risk forhepatitis B infection. Hepatitis C Testing is recommended for: Everyone born from 1945 through 1965. Anyone with known risk factors for hepatitis C. Sexually transmitted infections (STIs) Get screened for STIs, including gonorrhea and chlamydia, if: You are sexually active and are younger than 31 years of age. You are older than 31 years of age and your health care provider tells you that you are at risk for this type of infection. Your sexual activity has changed since you were last screened, and you are at increased risk for chlamydia or gonorrhea. Ask your health care provider if you are at risk. Ask your health care provider about whether you are at high risk for HIV. Your health care provider may recommend a prescription medicine to help   prevent HIV infection. If you choose to take medicine to prevent HIV, you should first get tested for HIV. You should then be tested every 3 months for as long as you are taking the medicine. Pregnancy If you are about to stop having your period (premenopausal) and you may become pregnant, seek counseling before you get  pregnant. Take 400 to 800 micrograms (mcg) of folic acid every day if you become pregnant. Ask for birth control (contraception) if you want to prevent pregnancy. Osteoporosis and menopause Osteoporosis is a disease in which the bones lose minerals and strength with aging. This can result in bone fractures. If you are 65 years old or older, or if you are at risk for osteoporosis and fractures, ask your health care provider if you should: Be screened for bone loss. Take a calcium or vitamin D supplement to lower your risk of fractures. Be given hormone replacement therapy (HRT) to treat symptoms of menopause. Follow these instructions at home: Lifestyle Do not use any products that contain nicotine or tobacco, such as cigarettes, e-cigarettes, and chewing tobacco. If you need help quitting, ask your health care provider. Do not use street drugs. Do not share needles. Ask your health care provider for help if you need support or information about quitting drugs. Alcohol use Do not drink alcohol if: Your health care provider tells you not to drink. You are pregnant, may be pregnant, or are planning to become pregnant. If you drink alcohol: Limit how much you use to 0-1 drink a day. Limit intake if you are breastfeeding. Be aware of how much alcohol is in your drink. In the [place] one drink equals one 12 oz bottle of beer (355 mL), one 5 oz glass of wine (148 mL), or one 1 oz glass of hard liquor (44 mL). General instructions Schedule regular health, dental, and eye exams. Stay current with your vaccines. Tell your health care provider if: You often feel depressed. You have ever been abused or do not feel safe at home. Summary Adopting a healthy lifestyle and getting preventive care are important in promoting health and wellness. Follow your health care provider's instructions about healthy diet, exercising, and getting tested or screened for diseases. Follow your health care provider's  instructions on monitoring your cholesterol and blood pressure. This information is not intended to replace advice given to you by your health care provider. Make sure you discuss any questions you have with your healthcare provider. Document Revised: 03/03/2018 Document Reviewed: 03/03/2018 Elsevier Patient Education  2022 Elsevier Inc.  

## 2020-09-12 NOTE — Progress Notes (Signed)
Pt present today due to wanting her IUD removed. Pt is currently not sexually activity and is concerned about the hormones in birth control.

## 2020-09-17 LAB — CYTOLOGY - PAP
Comment: NEGATIVE
Diagnosis: NEGATIVE
High risk HPV: NEGATIVE

## 2020-09-28 ENCOUNTER — Ambulatory Visit (INDEPENDENT_AMBULATORY_CARE_PROVIDER_SITE_OTHER): Payer: BC Managed Care – PPO | Admitting: Family Medicine

## 2020-09-28 ENCOUNTER — Telehealth: Payer: Self-pay | Admitting: *Deleted

## 2020-09-28 ENCOUNTER — Encounter: Payer: Self-pay | Admitting: Family Medicine

## 2020-09-28 VITALS — BP 105/66 | HR 79 | Temp 98.0°F | Ht 64.0 in | Wt 135.2 lb

## 2020-09-28 DIAGNOSIS — B9689 Other specified bacterial agents as the cause of diseases classified elsewhere: Secondary | ICD-10-CM | POA: Diagnosis not present

## 2020-09-28 DIAGNOSIS — N898 Other specified noninflammatory disorders of vagina: Secondary | ICD-10-CM | POA: Diagnosis not present

## 2020-09-28 DIAGNOSIS — N760 Acute vaginitis: Secondary | ICD-10-CM

## 2020-09-28 DIAGNOSIS — Z0000 Encounter for general adult medical examination without abnormal findings: Secondary | ICD-10-CM

## 2020-09-28 LAB — URINALYSIS, ROUTINE W REFLEX MICROSCOPIC
Bilirubin, UA: NEGATIVE
Glucose, UA: NEGATIVE
Ketones, UA: NEGATIVE
Leukocytes,UA: NEGATIVE
Nitrite, UA: NEGATIVE
Protein,UA: NEGATIVE
Specific Gravity, UA: >1.03 — ABNORMAL HIGH (ref 1.005–1.030)
Urobilinogen, Ur: 0.2 mg/dL (ref 0.2–1.0)
pH, UA: 6.5 (ref 5.0–7.5)

## 2020-09-28 LAB — WET PREP FOR TRICH, YEAST, CLUE
Clue Cell Exam: POSITIVE — AB
Trichomonas Exam: NEGATIVE
Yeast Exam: NEGATIVE

## 2020-09-28 LAB — MICROSCOPIC EXAMINATION
Bacteria, UA: NONE SEEN
WBC, UA: NONE SEEN /hpf (ref 0–5)

## 2020-09-28 MED ORDER — METRONIDAZOLE 500 MG PO TABS: 500.0000 mg | ORAL_TABLET | Freq: Two times a day (BID) | ORAL | 0 refills | Status: DC

## 2020-09-28 NOTE — Progress Notes (Signed)
BP 105/66   Pulse 79   Temp 98 F (36.7 C)   Ht 5\' 4"  (1.626 m)   Wt 135 lb 3.2 oz (61.3 kg)   LMP 09/04/2020   SpO2 97%   BMI 23.21 kg/m    Subjective:    Patient ID: 09/06/2020, female    DOB: [birth year], 31 y.o.   MRN: [id number]  HPI: Brittany Page is a 30 y.o. female presenting on 09/28/2020 for comprehensive medical examination. Current medical complaints include:  VAGINAL DISCHARGE Duration:  1 day Discharge description: white  Pruritus: no Dysuria: no Malodorous: yes Urinary frequency: no Fevers: no Abdominal pain: no  Sexual activity: not sexually active History of sexually transmitted diseases: no Recent antibiotic use: no Context: recurrent BV  Treatments attempted: none  Menopausal Symptoms: no  Depression Screen done today and results listed below:  Depression screen [facility] 2/9 09/28/2020 08/05/2019 12/04/2017 11/17/2017 10/08/2017  Decreased Interest 0 1 0 1 2  Down, Depressed, Hopeless 0 1 0 1 3  PHQ - 2 Score 0 2 0 2 5  Altered sleeping 0 0 0 1 1  Tired, decreased energy 0 1 1 2 2   Change in appetite 0 0 0 1 0  Feeling bad or failure about yourself  0 0 0 2 1  Trouble concentrating 0 0 0 2 2  Moving slowly or fidgety/restless 0 0 0 0 0  Suicidal thoughts 0 0 0 0 0  PHQ-9 Score 0 3 1 10 11   Difficult doing work/chores Not difficult at all Not difficult at all Not difficult at all Very difficult Very difficult    Past Medical History:  Past Medical History:  Diagnosis Date   Anemia 01/15/2015   Low hemoglobin    Miscarriage 04/2014   lost at 8 month gestation (still born)    Surgical History:  Past Surgical History:  Procedure Laterality Date   WISDOM TOOTH EXTRACTION     tooth in nasal cavity    Medications:  No current outpatient medications on file prior to visit.   No current facility-administered medications on file prior to visit.    Allergies:  No Known Allergies  Social History:  Social History   Socioeconomic History    Marital status: Single    Spouse name: Not on file   Number of children: Not on file   Years of education: Not on file   Highest education level: Not on file  Occupational History   Not on file  Tobacco Use   Smoking status: Never   Smokeless tobacco: Never  Vaping Use   Vaping Use: Never used  Substance and Sexual Activity   Alcohol use: Yes    Alcohol/week: 0.0 standard drinks    Comment: occas   Drug use: No   Sexual activity: Yes    Partners: Male    Birth control/protection: None  Other Topics Concern   Not on file  Social History Narrative   Not on file   Social Determinants of Health   Financial Resource Strain: Not on file  Food Insecurity: Not on file  Transportation Needs: Not on file  Physical Activity: Not on file  Stress: Not on file  Social Connections: Not on file  Intimate Partner Violence: Not on file   Social History   Tobacco Use  Smoking Status Never  Smokeless Tobacco Never   Social History   Substance and Sexual Activity  Alcohol Use Yes   Alcohol/week: 0.0 standard drinks   Comment: occas  Family History:  Family History  Problem Relation Age of Onset   Healthy Mother    Hypertension Father    Migraines Father    Sleep apnea Father        sinus problems   Cancer Maternal Grandmother        cervical cancer   Hepatitis B Maternal Grandfather    Pancreatic cancer Maternal Grandfather    Heart attack Paternal Grandfather     Past medical history, surgical history, medications, allergies, family history and social history reviewed with patient today and changes made to appropriate areas of the chart.   Review of Systems  Constitutional: Negative.   HENT: Negative.    Eyes: Negative.   Respiratory: Negative.    Cardiovascular: Negative.   Gastrointestinal: Negative.   Genitourinary: Negative.   Musculoskeletal: Negative.   Skin: Negative.   Neurological: Negative.   Endo/Heme/Allergies: Negative.    Psychiatric/Behavioral: Negative.    All other ROS negative except what is listed above and in the HPI.      Objective:    BP 105/66   Pulse 79   Temp 98 F (36.7 C)   Ht 5\' 4"  (1.626 m)   Wt 135 lb 3.2 oz (61.3 kg)   LMP 09/04/2020   SpO2 97%   BMI 23.21 kg/m   Wt Readings from Last 3 Encounters:  09/28/20 135 lb 3.2 oz (61.3 kg)  09/12/20 132 lb 4.8 oz (60 kg)  08/31/20 141 lb (64 kg)    Physical Exam Vitals and nursing note reviewed.  Constitutional:      General: She is not in acute distress.    Appearance: Normal appearance. She is not ill-appearing, toxic-appearing or diaphoretic.  HENT:     Head: Normocephalic and atraumatic.     Right Ear: Tympanic membrane, ear canal and external ear normal. There is no impacted cerumen.     Left Ear: Tympanic membrane, ear canal and external ear normal. There is no impacted cerumen.     Nose: Nose normal. No congestion or rhinorrhea.     Mouth/Throat:     Mouth: Mucous membranes are moist.     Pharynx: Oropharynx is clear. No oropharyngeal exudate or posterior oropharyngeal erythema.  Eyes:     General: No scleral icterus.       Right eye: No discharge.        Left eye: No discharge.     Extraocular Movements: Extraocular movements intact.     Conjunctiva/sclera: Conjunctivae normal.     Pupils: Pupils are equal, round, and reactive to light.  Neck:     Vascular: No carotid bruit.  Cardiovascular:     Rate and Rhythm: Normal rate and regular rhythm.     Pulses: Normal pulses.     Heart sounds: No murmur heard.   No friction rub. No gallop.  Pulmonary:     Effort: Pulmonary effort is normal. No respiratory distress.     Breath sounds: Normal breath sounds. No stridor. No wheezing, rhonchi or rales.  Chest:     Chest wall: No tenderness.  Abdominal:     General: Abdomen is flat. Bowel sounds are normal. There is no distension.     Palpations: Abdomen is soft. There is no mass.     Tenderness: There is no abdominal  tenderness. There is no right CVA tenderness, left CVA tenderness, guarding or rebound.     Hernia: No hernia is present.  Genitourinary:    Comments: Breast and pelvic exams deferred  with shared decision making Musculoskeletal:        General: No swelling, tenderness, deformity or signs of injury.     Cervical back: Normal range of motion and neck supple. No rigidity. No muscular tenderness.     Right lower leg: No edema.     Left lower leg: No edema.  Lymphadenopathy:     Cervical: No cervical adenopathy.  Skin:    General: Skin is warm and dry.     Capillary Refill: Capillary refill takes less than 2 seconds.     Coloration: Skin is not jaundiced or pale.     Findings: No bruising, erythema, lesion or rash.  Neurological:     General: No focal deficit present.     Mental Status: She is alert and oriented to person, place, and time. Mental status is at baseline.     Cranial Nerves: No cranial nerve deficit.     Sensory: No sensory deficit.     Motor: No weakness.     Coordination: Coordination normal.     Gait: Gait normal.     Deep Tendon Reflexes: Reflexes normal.  Psychiatric:        Mood and Affect: Mood normal.        Behavior: Behavior normal.        Thought Content: Thought content normal.        Judgment: Judgment normal.    Results for orders placed or performed in visit on 09/12/20  Cytology - PAP  Result Value Ref Range   High risk HPV Negative    Adequacy      Satisfactory for evaluation; transformation zone component PRESENT.   Diagnosis      - Negative for intraepithelial lesion or malignancy (NILM)   Comment Normal Reference Range HPV - Negative       Assessment & Plan:   Problem List Items Addressed This Visit   None Visit Diagnoses     Routine general medical examination at a health care facility    -  Primary   Vaccines up to date. Screening labs checked today. Pap up to date. Continue diet and exercise. Call with any concerns.    Relevant Orders    CBC with Differential/Platelet   Comprehensive metabolic panel   Lipid Panel w/o Chol/HDL Ratio   Urinalysis, Routine w reflex microscopic   TSH   Vaginal discharge       + clue cells   Relevant Orders   WET PREP FOR TRICH, YEAST, CLUE   BV (bacterial vaginosis)       Will treat with flagyl. Call with any concerns.    Relevant Medications   metroNIDAZOLE (FLAGYL) 500 MG tablet        Follow up plan: Return in about 1 year (around 09/28/2021) for physical.   LABORATORY TESTING:  - Pap smear: up to date  IMMUNIZATIONS:   - Tdap: Tetanus vaccination status reviewed: last tetanus booster within 10 years. - Influenza: Postponed to flu season - Pneumovax: Not applicable - COVID: Refused - HPV: Up to date   PATIENT COUNSELING:   Advised to take 1 mg of folate supplement per day if capable of pregnancy.   Sexuality: Discussed sexually transmitted diseases, partner selection, use of condoms, avoidance of unintended pregnancy  and contraceptive alternatives.   Advised to avoid cigarette smoking.  I discussed with the patient that most people either abstain from alcohol or drink within safe limits (<=14/week and <=4 drinks/occasion for males, <=7/weeks and <= 3 drinks/occasion  for females) and that the risk for alcohol disorders and other health effects rises proportionally with the number of drinks per week and how often a drinker exceeds daily limits.  Discussed cessation/primary prevention of drug use and availability of treatment for abuse.   Diet: Encouraged to adjust caloric intake to maintain  or achieve ideal body weight, to reduce intake of dietary saturated fat and total fat, to limit sodium intake by avoiding high sodium foods and not adding table salt, and to maintain adequate dietary potassium and calcium preferably from fresh fruits, vegetables, and low-fat dairy products.    stressed the importance of regular exercise  Injury prevention: Discussed safety belts,  safety helmets, smoke detector, smoking near bedding or upholstery.   Dental health: Discussed importance of regular tooth brushing, flossing, and dental visits.    NEXT PREVENTATIVE PHYSICAL DUE IN 1 YEAR. Return in about 1 year (around 09/28/2021) for physical.

## 2020-09-28 NOTE — Telephone Encounter (Signed)
Pt. Need appt. For a year not six months. Appt for Apr 02 2021 need to be cancelled and rescheduled for July 2023

## 2020-09-29 LAB — COMPREHENSIVE METABOLIC PANEL
ALT: 16 IU/L (ref 0–32)
AST: 18 IU/L (ref 0–40)
Albumin/Globulin Ratio: 1.8 (ref 1.2–2.2)
Albumin: 4.2 g/dL (ref 3.9–5.0)
Alkaline Phosphatase: 65 IU/L (ref 44–121)
BUN/Creatinine Ratio: 13 (ref 9–23)
BUN: 12 mg/dL (ref 6–20)
Bilirubin Total: 0.2 mg/dL (ref 0.0–1.2)
CO2: 23 mmol/L (ref 20–29)
Calcium: 9.3 mg/dL (ref 8.7–10.2)
Chloride: 103 mmol/L (ref 96–106)
Creatinine, Ser: 0.91 mg/dL (ref 0.57–1.00)
Globulin, Total: 2.4 g/dL (ref 1.5–4.5)
Glucose: 81 mg/dL (ref 65–99)
Potassium: 3.9 mmol/L (ref 3.5–5.2)
Sodium: 139 mmol/L (ref 134–144)
Total Protein: 6.6 g/dL (ref 6.0–8.5)
eGFR: 87 mL/min/{1.73_m2} (ref >59)

## 2020-09-29 LAB — LIPID PANEL W/O CHOL/HDL RATIO
Cholesterol, Total: 139 mg/dL (ref 100–199)
HDL: 68 mg/dL (ref >39)
LDL Chol Calc (NIH): 54 mg/dL (ref 0–99)
Triglycerides: 94 mg/dL (ref 0–149)
VLDL Cholesterol Cal: 17 mg/dL (ref 5–40)

## 2020-09-29 LAB — CBC WITH DIFFERENTIAL/PLATELET
Basophils Absolute: 0 10*3/uL (ref 0.0–0.2)
Basos: 0 %
EOS (ABSOLUTE): 0.1 10*3/uL (ref 0.0–0.4)
Eos: 1 %
Hematocrit: 35 % (ref 34.0–46.6)
Hemoglobin: 11.8 g/dL (ref 11.1–15.9)
Immature Grans (Abs): 0 10*3/uL (ref 0.0–0.1)
Immature Granulocytes: 0 %
Lymphocytes Absolute: 2.7 10*3/uL (ref 0.7–3.1)
Lymphs: 31 %
MCH: 32.1 pg (ref 26.6–33.0)
MCHC: 33.7 g/dL (ref 31.5–35.7)
MCV: 95 fL (ref 79–97)
Monocytes Absolute: 0.8 10*3/uL (ref 0.1–0.9)
Monocytes: 10 %
Neutrophils Absolute: 5 10*3/uL (ref 1.4–7.0)
Neutrophils: 58 %
Platelets: 208 10*3/uL (ref 150–450)
RBC: 3.68 x10E6/uL — ABNORMAL LOW (ref 3.77–5.28)
RDW: 12.8 % (ref 11.7–15.4)
WBC: 8.7 10*3/uL (ref 3.4–10.8)

## 2020-09-29 LAB — TSH: TSH: 0.877 u[IU]/mL (ref 0.450–4.500)

## 2020-11-21 ENCOUNTER — Encounter: Payer: PRIVATE HEALTH INSURANCE | Admitting: Obstetrics and Gynecology

## 2020-12-07 ENCOUNTER — Encounter: Payer: Self-pay | Admitting: Family Medicine

## 2020-12-07 NOTE — Telephone Encounter (Signed)
Lmom asking pt to call back to schedule an appt. °

## 2020-12-10 NOTE — Telephone Encounter (Signed)
Called pt again to get her scheduled for an appt. Her phone is going straight to vm. Lmom asking her to call back to get scheduled.

## 2020-12-10 NOTE — Progress Notes (Signed)
Acute Office Visit  Subjective:    Patient ID: Brittany Page, female    DOB: 21-Sep-1990, 30 y.o.   MRN: [id number]  Chief Complaint  Patient presents with   Vaginal Discharge    And itching x1 week    HPI Patient is in today for vaginal discharge and odor.   VAGINAL DISCHARGE  Duration: days 1 week after coming back from the beach Discharge description: white  Pruritus: yes Dysuria: no Malodorous: yes Urinary frequency: no Fevers: no Abdominal pain: no  Sexual activity: not sexually active History of sexually transmitted diseases: yes Recent antibiotic use: no Context: recurrent BV  Treatments attempted: none   Past Medical History:  Diagnosis Date   Anemia 01/15/2015   Low hemoglobin    Miscarriage 04/2014   lost at 8 month gestation (still born)    Past Surgical History:  Procedure Laterality Date   WISDOM TOOTH EXTRACTION     tooth in nasal cavity    Family History  Problem Relation Age of Onset   Healthy Mother    Hypertension Father    Migraines Father    Sleep apnea Father        sinus problems   Cancer Maternal Grandmother        cervical cancer   Hepatitis B Maternal Grandfather    Pancreatic cancer Maternal Grandfather    Heart attack Paternal Grandfather     Social History   Socioeconomic History   Marital status: Single    Spouse name: Not on file   Number of children: Not on file   Years of education: Not on file   Highest education level: Not on file  Occupational History   Not on file  Tobacco Use   Smoking status: Never   Smokeless tobacco: Never  Vaping Use   Vaping Use: Never used  Substance and Sexual Activity   Alcohol use: Yes    Alcohol/week: 0.0 standard drinks    Comment: occas   Drug use: No   Sexual activity: Yes    Partners: Male    Birth control/protection: None  Other Topics Concern   Not on file  Social History Narrative   Not on file   Social Determinants of Health   Financial Resource Strain: Not  on file  Food Insecurity: Not on file  Transportation Needs: Not on file  Physical Activity: Not on file  Stress: Not on file  Social Connections: Not on file  Intimate Partner Violence: Not on file    Outpatient Medications Prior to Visit  Medication Sig Dispense Refill   metroNIDAZOLE (FLAGYL) 500 MG tablet Take 1 tablet (500 mg total) by mouth 2 (two) times daily. (Patient not taking: Reported on 12/11/2020) 14 tablet 0   No facility-administered medications prior to visit.    No Known Allergies  Review of Systems  Constitutional: Negative.   Respiratory: Negative.    Cardiovascular: Negative.   Gastrointestinal: Negative.   Genitourinary:  Positive for vaginal discharge. Negative for dysuria, frequency and hematuria.      Objective:    Physical Exam Vitals and nursing note reviewed.  Constitutional:      General: She is not in acute distress.    Appearance: Normal appearance.  HENT:     Head: Normocephalic.  Eyes:     Conjunctiva/sclera: Conjunctivae normal.  Cardiovascular:     Rate and Rhythm: Normal rate and regular rhythm.     Pulses: Normal pulses.     Heart sounds: Normal heart sounds.  Pulmonary:     Effort: Pulmonary effort is normal.     Breath sounds: Normal breath sounds.  Musculoskeletal:     Cervical back: Normal range of motion.  Skin:    General: Skin is warm.  Neurological:     General: No focal deficit present.     Mental Status: She is alert and oriented to person, place, and time.  Psychiatric:        Mood and Affect: Mood normal.        Behavior: Behavior normal.        Thought Content: Thought content normal.        Judgment: Judgment normal.    BP 115/77 (BP Location: Right Arm, Patient Position: Sitting)   Pulse 82   Temp 98.5 F (36.9 C) (Oral)   Resp 16   Wt 136 lb 12.8 oz (62.1 kg)   SpO2 98%   BMI 23.48 kg/m  Wt Readings from Last 3 Encounters:  12/11/20 136 lb 12.8 oz (62.1 kg)  09/28/20 135 lb 3.2 oz (61.3 kg)   09/12/20 132 lb 4.8 oz (60 kg)    Health Maintenance Due  Topic Date Due   COVID-19 Vaccine (1) Never done   INFLUENZA VACCINE  Never done    There are no preventive care reminders to display for this patient.   Lab Results  Component Value Date   TSH 0.877 09/28/2020   Lab Results  Component Value Date   WBC 8.7 09/28/2020   HGB 11.8 09/28/2020   HCT 35.0 09/28/2020   MCV 95 09/28/2020   PLT 208 09/28/2020   Lab Results  Component Value Date   NA 139 09/28/2020   K 3.9 09/28/2020   CO2 23 09/28/2020   GLUCOSE 81 09/28/2020   BUN 12 09/28/2020   CREATININE 0.91 09/28/2020   BILITOT 0.2 09/28/2020   ALKPHOS 65 09/28/2020   AST 18 09/28/2020   ALT 16 09/28/2020   PROT 6.6 09/28/2020   ALBUMIN 4.2 09/28/2020   CALCIUM 9.3 09/28/2020   ANIONGAP 8 09/24/2015   EGFR 87 09/28/2020   Lab Results  Component Value Date   CHOL 139 09/28/2020   Lab Results  Component Value Date   HDL 68 09/28/2020   Lab Results  Component Value Date   LDLCALC 54 09/28/2020   Lab Results  Component Value Date   TRIG 94 09/28/2020   No results found for: CHOLHDL No results found for: HGBA1C     Assessment & Plan:   Problem List Items Addressed This Visit   None Visit Diagnoses     Vaginal discharge    -  Primary   Wet prep positive for BV   Relevant Orders   WET PREP FOR TRICH, YEAST, CLUE   Bacterial vaginosis       Will treat with flagyl. Discussed avoiding alcohol. F/U if symptoms worsen or don't improve.    Relevant Medications   metroNIDAZOLE (FLAGYL) 500 MG tablet        Meds ordered this encounter  Medications   metroNIDAZOLE (FLAGYL) 500 MG tablet    Sig: Take 1 tablet (500 mg total) by mouth 2 (two) times daily.    Dispense:  14 tablet    Refill:  0      Charyl Dancer, NP

## 2020-12-11 ENCOUNTER — Encounter: Payer: Self-pay | Admitting: Nurse Practitioner

## 2020-12-11 ENCOUNTER — Other Ambulatory Visit: Payer: Self-pay

## 2020-12-11 ENCOUNTER — Ambulatory Visit (INDEPENDENT_AMBULATORY_CARE_PROVIDER_SITE_OTHER): Payer: BC Managed Care – PPO | Admitting: Nurse Practitioner

## 2020-12-11 VITALS — BP 115/77 | HR 82 | Temp 98.5°F | Resp 16 | Wt 136.8 lb

## 2020-12-11 DIAGNOSIS — N760 Acute vaginitis: Secondary | ICD-10-CM

## 2020-12-11 DIAGNOSIS — N898 Other specified noninflammatory disorders of vagina: Secondary | ICD-10-CM | POA: Diagnosis not present

## 2020-12-11 DIAGNOSIS — B9689 Other specified bacterial agents as the cause of diseases classified elsewhere: Secondary | ICD-10-CM

## 2020-12-11 LAB — WET PREP FOR TRICH, YEAST, CLUE
Clue Cell Exam: POSITIVE — AB
Trichomonas Exam: NEGATIVE
Yeast Exam: NEGATIVE

## 2020-12-11 MED ORDER — METRONIDAZOLE 500 MG PO TABS: 500.0000 mg | ORAL_TABLET | Freq: Two times a day (BID) | ORAL | 0 refills | Status: DC

## 2020-12-13 ENCOUNTER — Encounter: Payer: PRIVATE HEALTH INSURANCE | Admitting: Obstetrics and Gynecology

## 2021-01-01 ENCOUNTER — Encounter: Payer: Self-pay | Admitting: Nurse Practitioner

## 2021-01-01 ENCOUNTER — Ambulatory Visit (INDEPENDENT_AMBULATORY_CARE_PROVIDER_SITE_OTHER): Payer: BC Managed Care – PPO | Admitting: Nurse Practitioner

## 2021-01-01 ENCOUNTER — Other Ambulatory Visit: Payer: Self-pay

## 2021-01-01 VITALS — BP 99/67 | HR 86 | Temp 98.7°F | Ht 63.86 in | Wt 140.4 lb

## 2021-01-01 DIAGNOSIS — N898 Other specified noninflammatory disorders of vagina: Secondary | ICD-10-CM

## 2021-01-01 DIAGNOSIS — N760 Acute vaginitis: Secondary | ICD-10-CM | POA: Diagnosis not present

## 2021-01-01 DIAGNOSIS — B9689 Other specified bacterial agents as the cause of diseases classified elsewhere: Secondary | ICD-10-CM

## 2021-01-01 LAB — URINALYSIS, ROUTINE W REFLEX MICROSCOPIC
Bilirubin, UA: NEGATIVE
Glucose, UA: NEGATIVE
Ketones, UA: NEGATIVE
Leukocytes,UA: NEGATIVE
Nitrite, UA: NEGATIVE
RBC, UA: NEGATIVE
Specific Gravity, UA: 1.02 (ref 1.005–1.030)
Urobilinogen, Ur: 1 mg/dL (ref 0.2–1.0)
pH, UA: 7.5 (ref 5.0–7.5)

## 2021-01-01 LAB — MICROSCOPIC EXAMINATION: WBC, UA: NONE SEEN /hpf (ref 0–5)

## 2021-01-01 LAB — WET PREP FOR TRICH, YEAST, CLUE
Clue Cell Exam: POSITIVE — AB
Trichomonas Exam: NEGATIVE
Yeast Exam: NEGATIVE

## 2021-01-01 MED ORDER — METRONIDAZOLE 0.75 % VA GEL: 1.0000 | VAGINAL | 5 refills | Status: DC

## 2021-01-01 MED ORDER — METRONIDAZOLE 500 MG PO TABS: 500.0000 mg | ORAL_TABLET | Freq: Two times a day (BID) | ORAL | 0 refills | Status: AC

## 2021-01-01 NOTE — Progress Notes (Signed)
Acute Office Visit  Subjective:    Patient ID: Brittany Page, female    DOB: [birth year], 31 y.o.   MRN: [id number]  Chief Complaint  Patient presents with   Vaginal odor    With discharge Started yesterday    Vaginal irritation    Recently had sex with a condom and now inside of vagina is red and irritated.     HPI Patient is in today for vaginal itching and discharge. She recently had intercourse with a condom about 2 days ago and is unsure if she is having an allergic reaction.   VAGINAL DISCHARGE  Duration: days Discharge description: white  Pruritus: yes Dysuria: no Malodorous: yes Urinary frequency: no Fevers: no Abdominal pain: no  Sexual activity: practicing safe sex History of sexually transmitted diseases: no Recent antibiotic use: no Context:  prior BV   Treatments attempted: none   Past Medical History:  Diagnosis Date   Anemia 01/15/2015   Low hemoglobin    Miscarriage 04/2014   lost at 8 month gestation (still born)    Past Surgical History:  Procedure Laterality Date   WISDOM TOOTH EXTRACTION     tooth in nasal cavity    Family History  Problem Relation Age of Onset   Healthy Mother    Hypertension Father    Migraines Father    Sleep apnea Father        sinus problems   Cancer Maternal Grandmother        cervical cancer   Hepatitis B Maternal Grandfather    Pancreatic cancer Maternal Grandfather    Heart attack Paternal Grandfather     Social History   Socioeconomic History   Marital status: Single    Spouse name: Not on file   Number of children: Not on file   Years of education: Not on file   Highest education level: Not on file  Occupational History   Not on file  Tobacco Use   Smoking status: Never   Smokeless tobacco: Never  Vaping Use   Vaping Use: Never used  Substance and Sexual Activity   Alcohol use: Yes    Alcohol/week: 0.0 standard drinks    Comment: occas   Drug use: No   Sexual activity: Yes    Partners:  Male    Birth control/protection: None  Other Topics Concern   Not on file  Social History Narrative   Not on file   Social Determinants of Health   Financial Resource Strain: Not on file  Food Insecurity: Not on file  Transportation Needs: Not on file  Physical Activity: Not on file  Stress: Not on file  Social Connections: Not on file  Intimate Partner Violence: Not on file    Outpatient Medications Prior to Visit  Medication Sig Dispense Refill   metroNIDAZOLE (FLAGYL) 500 MG tablet Take 1 tablet (500 mg total) by mouth 2 (two) times daily. 14 tablet 0   No facility-administered medications prior to visit.    No Known Allergies  Review of Systems  Constitutional: Negative.   Respiratory: Negative.    Cardiovascular: Negative.   Gastrointestinal: Negative.   Genitourinary:  Positive for vaginal discharge.       Vaginal itching and irritation  Musculoskeletal: Negative.   Skin: Negative.   Neurological: Negative.       Objective:    Physical Exam Vitals and nursing note reviewed.  Constitutional:      General: She is not in acute distress.    Appearance:  Normal appearance.  HENT:     Head: Normocephalic.  Eyes:     Conjunctiva/sclera: Conjunctivae normal.  Cardiovascular:     Rate and Rhythm: Normal rate.     Pulses: Normal pulses.  Pulmonary:     Effort: Pulmonary effort is normal.  Musculoskeletal:     Cervical back: Normal range of motion.  Skin:    General: Skin is warm.  Neurological:     General: No focal deficit present.     Mental Status: She is alert and oriented to person, place, and time.  Psychiatric:        Mood and Affect: Mood normal.        Behavior: Behavior normal.        Thought Content: Thought content normal.        Judgment: Judgment normal.    BP 99/67   Pulse 86   Temp 98.7 F (37.1 C) (Oral)   Ht 5' 3.86" (1.622 m)   Wt 140 lb 6.4 oz (63.7 kg)   SpO2 97%   BMI 24.21 kg/m  Wt Readings from Last 3 Encounters:   01/01/21 140 lb 6.4 oz (63.7 kg)  12/11/20 136 lb 12.8 oz (62.1 kg)  09/28/20 135 lb 3.2 oz (61.3 kg)    Health Maintenance Due  Topic Date Due   COVID-19 Vaccine (1) Never done    There are no preventive care reminders to display for this patient.   Lab Results  Component Value Date   TSH 0.877 09/28/2020   Lab Results  Component Value Date   WBC 8.7 09/28/2020   HGB 11.8 09/28/2020   HCT 35.0 09/28/2020   MCV 95 09/28/2020   PLT 208 09/28/2020   Lab Results  Component Value Date   NA 139 09/28/2020   K 3.9 09/28/2020   CO2 23 09/28/2020   GLUCOSE 81 09/28/2020   BUN 12 09/28/2020   CREATININE 0.91 09/28/2020   BILITOT 0.2 09/28/2020   ALKPHOS 65 09/28/2020   AST 18 09/28/2020   ALT 16 09/28/2020   PROT 6.6 09/28/2020   ALBUMIN 4.2 09/28/2020   CALCIUM 9.3 09/28/2020   ANIONGAP 8 09/24/2015   EGFR 87 09/28/2020   Lab Results  Component Value Date   CHOL 139 09/28/2020   Lab Results  Component Value Date   HDL 68 09/28/2020   Lab Results  Component Value Date   LDLCALC 54 09/28/2020   Lab Results  Component Value Date   TRIG 94 09/28/2020   No results found for: CHOLHDL No results found for: HGBA1C     Assessment & Plan:   Problem List Items Addressed This Visit       Genitourinary   Bacterial vaginosis    Positive BV on wet prep. Will treat with flagyl BID x7 days. She has had 4 BV infections over the past year, with 3 in the last 3 months. Will start her on metronidazole gel twice a week after completing the 7 day course of PO flagyl. Given information on BV. Follow up in 6 months or sooner with any concerns. Discussed if she has symptoms in the future, she still needs to come in and get tested as the vaginal gel at this dose is a preventative measure and not treatment.       Relevant Medications   metroNIDAZOLE (FLAGYL) 500 MG tablet   Other Visit Diagnoses     Vaginal odor    -  Primary   U/A showed few bacteria. Will  add on a urine  culture. Wet prep positive for clue cells, will treat for BV.    Relevant Orders   WET PREP FOR [place], YEAST, CLUE (Completed)   Urinalysis, Routine w reflex microscopic (Completed)   Urine Culture        Meds ordered this encounter  Medications   metroNIDAZOLE (METROGEL) 0.75 % vaginal gel    Sig: Place 1 Applicatorful vaginally 2 (two) times a week.    Dispense:  70 g    Refill:  5   metroNIDAZOLE (FLAGYL) 500 MG tablet    Sig: Take 1 tablet (500 mg total) by mouth 2 (two) times daily for 7 days.    Dispense:  14 tablet    Refill:  0      Charyl Dancer, NP

## 2021-01-02 DIAGNOSIS — N760 Acute vaginitis: Secondary | ICD-10-CM | POA: Insufficient documentation

## 2021-01-02 DIAGNOSIS — B9689 Other specified bacterial agents as the cause of diseases classified elsewhere: Secondary | ICD-10-CM | POA: Insufficient documentation

## 2021-01-02 NOTE — Assessment & Plan Note (Addendum)
Positive BV on wet prep. Will treat with flagyl BID x7 days. She has had 4 BV infections over the past year, with 3 in the last 3 months. Will start her on metronidazole gel twice a week after completing the 7 day course of PO flagyl. Given information on BV. Follow up in 6 months or sooner with any concerns. Discussed if she has symptoms in the future, she still needs to come in and get tested as the vaginal gel at this dose is a preventative measure and not treatment.

## 2021-01-04 LAB — URINE CULTURE

## 2021-02-21 NOTE — Patient Instructions (Incomplete)
Breast Self-Awareness °Breast self-awareness is knowing how your breasts look and feel. Doing breast self-awareness is important. It allows you to catch a breast problem early while it is still small and can be treated. All women should do breast self-awareness, including women who have had breast implants. Tell your doctor if you notice a change in your breasts. °What you need: °A mirror. °A well-lit room. °How to do a breast self-exam °A breast self-exam is one way to learn what is normal for your breasts and to check for changes. To do a breast self-exam: °Look for changes ° °Take off all the clothes above your waist. °Stand in front of a mirror in a room with good lighting. °Put your hands on your hips. °Push your hands down. °Look at your breasts and nipples in the mirror to see if one breast or nipple looks different from the other. Check to see if: °The shape of one breast is different. °The size of one breast is different. °There are wrinkles, dips, and bumps in one breast and not the other. °Look at each breast for changes in the skin, such as: °Redness. °Scaly areas. °Look for changes in your nipples, such as: °Liquid around the nipples. °Bleeding. °Dimpling. °Redness. °A change in where the nipples are. °Feel for changes ° °Lie on your back on the floor. °Feel each breast. To do this, follow these steps: °Pick a breast to feel. °Put the arm closest to that breast above your head. °Use your other arm to feel the nipple area of your breast. Feel the area with the pads of your three middle fingers by making small circles with your fingers. For the first circle, press lightly. For the second circle, press harder. For the third circle, press even harder. °Keep making circles with your fingers at the different pressures as you move down your breast. Stop when you feel your ribs. °Move your fingers a little toward the center of your body. °Start making circles with your fingers again, this time going up until  you reach your collarbone. °Keep making up-and-down circles until you reach your armpit. Remember to keep using the three pressures. °Feel the other breast in the same way. °Sit or stand in the tub or shower. °With soapy water on your skin, feel each breast the same way you did in step 2 when you were lying on the floor. °Write down what you find °Writing down what you find can help you remember what to tell your doctor. Write down: °What is normal for each breast. °Any changes you find in each breast, including: °The kind of changes you find. °Whether you have pain. °Size and location of any lumps. °When you last had your menstrual period. °General tips °Check your breasts every month. °If you are breastfeeding, the best time to check your breasts is after you feed your baby or after you use a breast pump. °If you get menstrual periods, the best time to check your breasts is 5-7 days after your menstrual period is over. °With time, you will become comfortable with the self-exam, and you will begin to know if there are changes in your breasts. °Contact a doctor if you: °See a change in the shape or size of your breasts or nipples. °See a change in the skin of your breast or nipples, such as red or scaly skin. °Have fluid coming from your nipples that is not normal. °Find a lump or thick area that was not there before. °Have pain in   your breasts. °Have any concerns about your breast health. °Summary °Breast self-awareness includes looking for changes in your breasts, as well as feeling for changes within your breasts. °Breast self-awareness should be done in front of a mirror in a well-lit room. °You should check your breasts every month. If you get menstrual periods, the best time to check your breasts is 5-7 days after your menstrual period is over. °Let your doctor know of any changes you see in your breasts, including changes in size, changes on the skin, pain or tenderness, or fluid from your nipples that is not  normal. °This information is not intended to replace advice given to you by your health care provider. Make sure you discuss any questions you have with your health care provider. °Document Revised: 10/27/2017 Document Reviewed: 10/27/2017 °Elsevier Patient Education © 2022 [employer] °Preventive Care 21-39 Years Old, Female °Preventive care refers to lifestyle choices and visits with your health care provider that can promote health and wellness. Preventive care visits are also called wellness exams. °What can I expect for my preventive care visit? °Counseling °During your preventive care visit, your health care provider may ask about your: °Medical history, including: °Past medical problems. °Family medical history. °Pregnancy history. °Current health, including: °Menstrual cycle. °Method of birth control. °Emotional well-being. °Home life and relationship well-being. °Sexual activity and sexual health. °Lifestyle, including: °Alcohol, nicotine or tobacco, and drug use. °Access to firearms. °Diet, exercise, and sleep habits. °Work and work environment. °Sunscreen use. °Safety issues such as seatbelt and bike helmet use. °Physical exam °Your health care provider may check your: °Height and weight. These may be used to calculate your BMI (body mass index). BMI is a measurement that tells if you are at a healthy weight. °Waist circumference. This measures the distance around your waistline. This measurement also tells if you are at a healthy weight and may help predict your risk of certain diseases, such as type 2 diabetes and high blood pressure. °Heart rate and blood pressure. °Body temperature. °Skin for abnormal spots. °What immunizations do I need? °Vaccines are usually given at various ages, according to a schedule. Your health care provider will recommend vaccines for you based on your age, medical history, and lifestyle or other factors, such as travel or where you work. °What tests do I  need? °Screening °Your health care provider may recommend screening tests for certain conditions. This may include: °Pelvic exam and Pap test. °Lipid and cholesterol levels. °Diabetes screening. This is done by checking your blood sugar (glucose) after you have not eaten for a while (fasting). °Hepatitis B test. °Hepatitis C test. °HIV (human immunodeficiency virus) test. °STI (sexually transmitted infection) testing, if you are at risk. °BRCA-related cancer screening. This may be done if you have a family history of breast, ovarian, tubal, or peritoneal cancers. °Talk with your health care provider about your test results, treatment options, and if necessary, the need for more tests. °Follow these instructions at home: °Eating and drinking ° °Eat a healthy diet that includes fresh fruits and vegetables, whole grains, lean protein, and low-fat dairy products. °Take vitamin and mineral supplements as recommended by your health care provider. °Do not drink alcohol if: °Your health care provider tells you not to drink. °You are pregnant, may be pregnant, or are planning to become pregnant. °If you drink alcohol: °Limit how much you have to 0-1 drink a day. °Know how much alcohol is in your drink. In the [place] one drink equals one 12 oz   bottle of beer (355 mL), one 5 oz glass of wine (148 mL), or one 1 oz glass of hard liquor (44 mL). Lifestyle Brush your teeth every morning and night with fluoride toothpaste. Floss one time each day. Exercise for at least 30 minutes 5 or more days each week. Do not use any products that contain nicotine or tobacco. These products include cigarettes, chewing tobacco, and vaping devices, such as e-cigarettes. If you need help quitting, ask your health care provider. Do not use drugs. If you are sexually active, practice safe sex. Use a condom or other form of protection to prevent STIs. If you do not wish to become pregnant, use a form of birth control. If you plan to become  pregnant, see your health care provider for a prepregnancy visit. Find healthy ways to manage stress, such as: Meditation, yoga, or listening to music. Journaling. Talking to a trusted person. Spending time with friends and family. Minimize exposure to UV radiation to reduce your risk of skin cancer. Safety Always wear your seat belt while driving or riding in a vehicle. Do not drive: If you have been drinking alcohol. Do not ride with someone who has been drinking. If you have been using any mind-altering substances or drugs. While texting. When you are tired or distracted. Wear a helmet and other protective equipment during sports activities. If you have firearms in your house, make sure you follow all gun safety procedures. Seek help if you have been physically or sexually abused. What's next? Go to your health care provider once a year for an annual wellness visit. Ask your health care provider how often you should have your eyes and teeth checked. Stay up to date on all vaccines. This information is not intended to replace advice given to you by your health care provider. Make sure you discuss any questions you have with your health care provider. Document Revised: 09/05/2020 Document Reviewed: 09/05/2020 Elsevier Patient Education  [employer].

## 2021-02-21 NOTE — Progress Notes (Deleted)
GYNECOLOGY ANNUAL PHYSICAL EXAM PROGRESS NOTE  Subjective:    Brittany Page is a 30 y.o. G60P1101 female who presents for an annual exam. The patient wears seatbelts: yes. The patient participates in regular exercise: no. Has the patient ever been transfused or tattooed?: no. The patient reports that there is not currently domestic violence in her life (but does report a past history).   Menstrual History: Menarche age: 27 No LMP recorded. (Menstrual status: IUD).    Gynecologic History:  Contraception: IUD History of STI's:  Last Pap: 09/12/2020. Results were: normal.  Denies h/o abnormal pap smears. Last mammogram: Not age appropriate  OB History  Gravida Para Term Preterm AB Living  2 2 1 1  0 1  SAB IAB Ectopic Multiple Live Births  0 0 0 0 1    # Outcome Date GA Lbr Len/2nd Weight Sex Delivery Anes PTL Lv  2 Term 03/18/16 [redacted]w[redacted]d 19:00 / 00:55 7 lb 10.8 oz (3.48 kg) M Vag-Spont EPI  LIV     Name: Matchett,BOY Glory     Apgar1: 7  Apgar5: 9  1 Preterm 05/03/14 [redacted]w[redacted]d   M Vag-Spont EPI N FD    Obstetric Comments  05/03/2014 stillbirth (cord twisted), unsure this was why.    Past Medical History:  Diagnosis Date   Anemia 01/15/2015   Low hemoglobin    Miscarriage 04/2014   lost at 8 month gestation (still born)    Past Surgical History:  Procedure Laterality Date   WISDOM TOOTH EXTRACTION     tooth in nasal cavity    Family History  Problem Relation Age of Onset   Healthy Mother    Hypertension Father    Migraines Father    Sleep apnea Father        sinus problems   Cancer Maternal Grandmother        cervical cancer   Hepatitis B Maternal Grandfather    Pancreatic cancer Maternal Grandfather    Heart attack Paternal Grandfather     Social History   Socioeconomic History   Marital status: Single    Spouse name: Not on file   Number of children: Not on file   Years of education: Not on file   Highest education level: Not on file  Occupational  History   Not on file  Tobacco Use   Smoking status: Never   Smokeless tobacco: Never  Vaping Use   Vaping Use: Never used  Substance and Sexual Activity   Alcohol use: Yes    Alcohol/week: 0.0 standard drinks    Comment: occas   Drug use: No   Sexual activity: Yes    Partners: Male    Birth control/protection: None  Other Topics Concern   Not on file  Social History Narrative   Not on file   Social Determinants of Health   Financial Resource Strain: Not on file  Food Insecurity: Not on file  Transportation Needs: Not on file  Physical Activity: Not on file  Stress: Not on file  Social Connections: Not on file  Intimate Partner Violence: Not on file    Current Outpatient Medications on File Prior to Visit  Medication Sig Dispense Refill   metroNIDAZOLE (METROGEL) 0.75 % vaginal gel Place 1 Applicatorful vaginally 2 (two) times a week. 70 g 5   No current facility-administered medications on file prior to visit.    No Known Allergies   Review of Systems Constitutional: negative for chills, fatigue, fevers and sweats Eyes: negative  for irritation, redness and visual disturbance Ears, nose, mouth, throat, and face: negative for hearing loss, nasal congestion, snoring and tinnitus Respiratory: negative for asthma, cough, sputum Cardiovascular: negative for chest pain, dyspnea, exertional chest pressure/discomfort, irregular heart beat, palpitations and syncope Gastrointestinal: negative for abdominal pain, change in bowel habits, nausea and vomiting Genitourinary: negative for abnormal menstrual periods, genital lesions, sexual problems and vaginal discharge, dysuria and urinary incontinence Integument/breast: negative for breast lump, breast tenderness and nipple discharge Hematologic/lymphatic: negative for bleeding and easy bruising Musculoskeletal:negative for back pain and muscle weakness Neurological: negative for dizziness, headaches, vertigo and  weakness Endocrine: negative for diabetic symptoms including polydipsia, polyuria and skin dryness Allergic/Immunologic: negative for hay fever and urticaria      Objective:  There were no vitals taken for this visit. There is no height or weight on file to calculate BMI.    General Appearance:    Alert, cooperative, no distress, appears stated age  Head:    Normocephalic, without obvious abnormality, atraumatic  Eyes:    PERRL, conjunctiva/corneas clear, EOM's intact, both eyes  Ears:    Normal external ear canals, both ears  Nose:   Nares normal, septum midline, mucosa normal, no drainage or sinus tenderness  Throat:   Lips, mucosa, and tongue normal; teeth and gums normal  Neck:   Supple, symmetrical, trachea midline, no adenopathy; thyroid: no enlargement/tenderness/nodules; no carotid bruit or JVD  Back:     Symmetric, no curvature, ROM normal, no CVA tenderness  Lungs:     Clear to auscultation bilaterally, respirations unlabored  Chest Wall:    No tenderness or deformity   Heart:    Regular rate and rhythm, S1 and S2 normal, no murmur, rub or gallop  Breast Exam:    No tenderness, masses, or nipple abnormality  Abdomen:     Soft, non-tender, bowel sounds active all four quadrants, no masses, no organomegaly.    Genitalia:    Pelvic:external genitalia normal, vagina without lesions, discharge, or tenderness, rectovaginal septum  normal. Cervix normal in appearance, no cervical motion tenderness, no adnexal masses or tenderness.  Uterus normal size, shape, mobile, regular contours, nontender.  Rectal:    Normal external sphincter.  No hemorrhoids appreciated. Internal exam not done.   Extremities:   Extremities normal, atraumatic, no cyanosis or edema  Pulses:   2+ and symmetric all extremities  Skin:   Skin color, texture, turgor normal, no rashes or lesions  Lymph nodes:   Cervical, supraclavicular, and axillary nodes normal  Neurologic:   CNII-XII intact, normal strength,  sensation and reflexes throughout   .  Labs:  Lab Results  Component Value Date   WBC 8.7 09/28/2020   HGB 11.8 09/28/2020   HCT 35.0 09/28/2020   MCV 95 09/28/2020   PLT 208 09/28/2020    Lab Results  Component Value Date   CREATININE 0.91 09/28/2020   BUN 12 09/28/2020   NA 139 09/28/2020   K 3.9 09/28/2020   CL 103 09/28/2020   CO2 23 09/28/2020    Lab Results  Component Value Date   ALT 16 09/28/2020   AST 18 09/28/2020   ALKPHOS 65 09/28/2020   BILITOT 0.2 09/28/2020    Lab Results  Component Value Date   TSH 0.877 09/28/2020     Assessment:   No diagnosis found.   Plan:  Blood tests: Labs are UTD, done by PCP. Breast self exam technique reviewed and patient encouraged to perform self-exam monthly. Contraception: IUD. Discussed healthy  lifestyle modifications. Mammogram  Not age appropriate Pap smear  UTD . COVID vaccination status: Follow up in 1 year for annual exam   Rubie Maid, MD [facility]

## 2021-02-22 ENCOUNTER — Encounter: Payer: PRIVATE HEALTH INSURANCE | Admitting: Obstetrics and Gynecology

## 2021-03-27 NOTE — Progress Notes (Signed)
Acute Office Visit  Subjective:    Patient ID: Brittany Page, female    DOB: 15-Nov-1990, 31 y.o.   MRN: [id number]  Chief Complaint  Patient presents with   Bacterial Vaginosis    Patient states her symptoms started this week a couple of days ago. Patient states she has tried the medication and started she feels better. Patient states she is not having any more symptoms. Patient states it just feels irritated.     HPI Patient is in today for vaginal discomfort and irritation.   VAGINAL IRRITATION   Duration: days Discharge description: white  Pruritus: yes Dysuria: no Malodorous: yes Urinary frequency: no Fevers: no Abdominal pain: no  Sexual activity:  sexually active History of sexually transmitted diseases: no Recent antibiotic use: no Context: worse and recurrent BV  Treatments attempted: none   Past Medical History:  Diagnosis Date   Anemia 01/15/2015   Low hemoglobin    Miscarriage 04/2014   lost at 8 month gestation (still born)    Past Surgical History:  Procedure Laterality Date   WISDOM TOOTH EXTRACTION     tooth in nasal cavity    Family History  Problem Relation Age of Onset   Healthy Mother    Hypertension Father    Migraines Father    Sleep apnea Father        sinus problems   Cancer Maternal Grandmother        cervical cancer   Hepatitis B Maternal Grandfather    Pancreatic cancer Maternal Grandfather    Heart attack Paternal Grandfather     Social History   Socioeconomic History   Marital status: Single    Spouse name: Not on file   Number of children: Not on file   Years of education: Not on file   Highest education level: Not on file  Occupational History   Not on file  Tobacco Use   Smoking status: Never   Smokeless tobacco: Never  Vaping Use   Vaping Use: Never used  Substance and Sexual Activity   Alcohol use: Yes    Alcohol/week: 0.0 standard drinks    Comment: occas   Drug use: No   Sexual activity: Yes     Partners: Male    Birth control/protection: None  Other Topics Concern   Not on file  Social History Narrative   Not on file   Social Determinants of Health   Financial Resource Strain: Not on file  Food Insecurity: Not on file  Transportation Needs: Not on file  Physical Activity: Not on file  Stress: Not on file  Social Connections: Not on file  Intimate Partner Violence: Not on file    Outpatient Medications Prior to Visit  Medication Sig Dispense Refill   metroNIDAZOLE (METROGEL) 0.75 % vaginal gel Place 1 Applicatorful vaginally 2 (two) times a week. 70 g 5   No facility-administered medications prior to visit.    No Known Allergies  Review of Systems  Constitutional: Negative.   Respiratory: Negative.    Cardiovascular: Negative.   Gastrointestinal: Negative.   Genitourinary:  Positive for vaginal discharge. Negative for dysuria and frequency.  Musculoskeletal: Negative.   Skin: Negative.   Neurological: Negative.       Objective:    Physical Exam Vitals and nursing note reviewed.  Constitutional:      General: She is not in acute distress.    Appearance: Normal appearance.  HENT:     Head: Normocephalic.  Eyes:  Conjunctiva/sclera: Conjunctivae normal.  Cardiovascular:     Rate and Rhythm: Normal rate.  Pulmonary:     Effort: Pulmonary effort is normal.  Musculoskeletal:     Cervical back: Normal range of motion.  Skin:    General: Skin is warm.  Neurological:     General: No focal deficit present.     Mental Status: She is alert and oriented to person, place, and time.  Psychiatric:        Mood and Affect: Mood normal.        Behavior: Behavior normal.        Thought Content: Thought content normal.        Judgment: Judgment normal.    BP 120/70    Pulse 93    Wt 140 lb 9.6 oz (63.8 kg)    SpO2 98%    BMI 24.24 kg/m  Wt Readings from Last 3 Encounters:  03/28/21 140 lb 9.6 oz (63.8 kg)  01/01/21 140 lb 6.4 oz (63.7 kg)  12/11/20 136  lb 12.8 oz (62.1 kg)    Health Maintenance Due  Topic Date Due   COVID-19 Vaccine (1) Never done    There are no preventive care reminders to display for this patient.   Lab Results  Component Value Date   TSH 0.877 09/28/2020   Lab Results  Component Value Date   WBC 8.7 09/28/2020   HGB 11.8 09/28/2020   HCT 35.0 09/28/2020   MCV 95 09/28/2020   PLT 208 09/28/2020   Lab Results  Component Value Date   NA 139 09/28/2020   K 3.9 09/28/2020   CO2 23 09/28/2020   GLUCOSE 81 09/28/2020   BUN 12 09/28/2020   CREATININE 0.91 09/28/2020   BILITOT 0.2 09/28/2020   ALKPHOS 65 09/28/2020   AST 18 09/28/2020   ALT 16 09/28/2020   PROT 6.6 09/28/2020   ALBUMIN 4.2 09/28/2020   CALCIUM 9.3 09/28/2020   ANIONGAP 8 09/24/2015   EGFR 87 09/28/2020   Lab Results  Component Value Date   CHOL 139 09/28/2020   Lab Results  Component Value Date   HDL 68 09/28/2020   Lab Results  Component Value Date   LDLCALC 54 09/28/2020   Lab Results  Component Value Date   TRIG 94 09/28/2020   No results found for: CHOLHDL No results found for: HGBA1C     Assessment & Plan:   Problem List Items Addressed This Visit       Genitourinary   BV (bacterial vaginosis)    Recurrent BV. Did not remember to continue metronidazole vaginal gel twice a week after finishing last treatment. Will treat with metronidazole PO x7 days and then start metronidazole gel twice a week for at least 6 months.       Relevant Medications   metroNIDAZOLE (FLAGYL) 500 MG tablet   Other Visit Diagnoses     Vaginal itching    -  Primary   wet prep showed BV   Relevant Orders   WET PREP FOR [place], YEAST, CLUE (Completed)        Meds ordered this encounter  Medications   metroNIDAZOLE (FLAGYL) 500 MG tablet    Sig: Take 1 tablet (500 mg total) by mouth 2 (two) times daily for 7 days.    Dispense:  14 tablet    Refill:  0     Charyl Dancer, NP

## 2021-03-28 ENCOUNTER — Encounter: Payer: Self-pay | Admitting: Nurse Practitioner

## 2021-03-28 ENCOUNTER — Other Ambulatory Visit: Payer: Self-pay

## 2021-03-28 ENCOUNTER — Ambulatory Visit (INDEPENDENT_AMBULATORY_CARE_PROVIDER_SITE_OTHER): Payer: BC Managed Care – PPO | Admitting: Nurse Practitioner

## 2021-03-28 VITALS — BP 120/70 | HR 93 | Wt 140.6 lb

## 2021-03-28 DIAGNOSIS — N898 Other specified noninflammatory disorders of vagina: Secondary | ICD-10-CM

## 2021-03-28 DIAGNOSIS — B9689 Other specified bacterial agents as the cause of diseases classified elsewhere: Secondary | ICD-10-CM | POA: Diagnosis not present

## 2021-03-28 DIAGNOSIS — N760 Acute vaginitis: Secondary | ICD-10-CM

## 2021-03-28 LAB — WET PREP FOR TRICH, YEAST, CLUE
Clue Cell Exam: POSITIVE — AB
Trichomonas Exam: NEGATIVE
Yeast Exam: NEGATIVE

## 2021-03-28 MED ORDER — METRONIDAZOLE 500 MG PO TABS: 500.0000 mg | ORAL_TABLET | Freq: Two times a day (BID) | ORAL | 0 refills | Status: AC

## 2021-03-28 NOTE — Assessment & Plan Note (Signed)
Recurrent BV. Did not remember to continue metronidazole vaginal gel twice a week after finishing last treatment. Will treat with metronidazole PO x7 days and then start metronidazole gel twice a week for at least 6 months.

## 2021-03-28 NOTE — Patient Instructions (Addendum)
Metronidazole pills twice a day for 1 week  Then start metronidazole gel twice a week for 6 months

## 2021-04-02 ENCOUNTER — Ambulatory Visit: Payer: PRIVATE HEALTH INSURANCE | Admitting: Family Medicine

## 2021-05-17 ENCOUNTER — Encounter: Payer: Self-pay | Admitting: Family Medicine

## 2021-05-28 ENCOUNTER — Ambulatory Visit: Payer: Self-pay | Admitting: *Deleted

## 2021-05-28 ENCOUNTER — Telehealth (INDEPENDENT_AMBULATORY_CARE_PROVIDER_SITE_OTHER): Payer: BC Managed Care – PPO | Admitting: Internal Medicine

## 2021-05-28 ENCOUNTER — Encounter: Payer: Self-pay | Admitting: Internal Medicine

## 2021-05-28 ENCOUNTER — Ambulatory Visit: Payer: Self-pay

## 2021-05-28 DIAGNOSIS — J020 Streptococcal pharyngitis: Secondary | ICD-10-CM | POA: Insufficient documentation

## 2021-05-28 DIAGNOSIS — R52 Pain, unspecified: Secondary | ICD-10-CM | POA: Diagnosis not present

## 2021-05-28 DIAGNOSIS — J069 Acute upper respiratory infection, unspecified: Secondary | ICD-10-CM | POA: Insufficient documentation

## 2021-05-28 LAB — RAPID STREP SCREEN (MED CTR MEBANE ONLY): Strep Gp A Ag, IA W/Reflex: POSITIVE — AB

## 2021-05-28 LAB — VERITOR FLU A/B WAIVED
Influenza A: NEGATIVE
Influenza B: NEGATIVE

## 2021-05-28 MED ORDER — AMOXICILLIN 500 MG PO CAPS: 500.0000 mg | ORAL_CAPSULE | Freq: Two times a day (BID) | ORAL | 0 refills | Status: AC

## 2021-05-28 NOTE — Telephone Encounter (Signed)
?  Chief Complaint: Body aches, possible fever ?Symptoms: BA, scratchy throat ?Frequency: since yesterday ?Pertinent Negatives: Patient denies High fever - COVID test negative ?Disposition: [] ED /[] Urgent Care (no appt availability in office) / [] Appointment(In office/virtual)/ []  [facility] [facility]/ [] Home Care/ [] Refused Recommended Disposition /[] [place] [facility]/ [x]  Follow-up with PCP ?Additional Notes: Pt has virtual appt later today. Relative is getting IBU or Tylenol and a thermometer for pt.  ? ? ? ?Summary: patient body aches, dizzy lightheaded  ? Patient called in stated that she woke up and took a covid test since she was feeling lightheaded, dizzy,  lots of body ache and pain. Say that the mucus is very yellow. I was able to schedule her for a virtual visit this afternoon. Please advise Can be reached at Ph#  [phone]   ?  ? ?Reason for Disposition ? [1] COVID-19 infection suspected by caller or triager AND [2] mild symptoms (cough, fever, or others) AND [3] negative COVID-19 rapid test ? ?Answer Assessment - Initial Assessment Questions ?1. COVID-19 DIAGNOSIS: "Who made your COVID-19 diagnosis?" "Was it confirmed by a positive lab test or self-test?" If not diagnosed by a doctor (or NP/PA), ask "Are there lots of cases (community spread) where you live?" Note: See public health department website, if unsure. ?    Pt states she felt like this when had COVID before ?2. COVID-19 EXPOSURE: "Was there any known exposure to COVID before the symptoms began?" CDC Definition of close contact: within 6 feet (2 meters) for a total of 15 minutes or more over a 24-hour period.  ?    no ?3. ONSET: "When did the COVID-19 symptoms start?"  ?    yesterday ?4. WORST SYMPTOM: "What is your worst symptom?" (e.g., cough, fever, shortness of breath, muscle aches) ?    Body aches ?5. COUGH: "Do you have a cough?" If Yes, ask: "How bad is the cough?"   ?    no ?6. FEVER: "Do you have a fever?" If Yes, ask:  "What is your temperature, how was it measured, and when did it start?" ?    Unsure  - ?7. RESPIRATORY STATUS: "Describe your breathing?" (e.g., shortness of breath, wheezing, unable to speak)  ?    ok ?8. BETTER-SAME-WORSE: "Are you getting better, staying the same or getting worse compared to yesterday?"  If getting worse, ask, "In what way?" ?    worse ?9. HIGH RISK DISEASE: "Do you have any chronic medical problems?" (e.g., asthma, heart or lung disease, weak immune system, obesity, etc.) ?     ?10. VACCINE: "Have you had the COVID-19 vaccine?" If Yes, ask: "Which one, how many shots, when did you get it?" ?       ?11. BOOSTER: "Have you received your COVID-19 booster?" If Yes, ask: "Which one and when did you get it?" ?       ?12. PREGNANCY: "Is there any chance you are pregnant?" "When was your last menstrual period?" ?       ?13. OTHER SYMPTOMS: "Do you have any other symptoms?"  (e.g., chills, fatigue, headache, loss of smell or taste, muscle pain, sore throat) ?      Sore throat ?14. O2 SATURATION MONITOR:  "Do you use an oxygen saturation monitor (pulse oximeter) at home?" If Yes, ask "What is your reading (oxygen level) today?" "What is your usual oxygen saturation reading?" (e.g., 95%) ? ?Protocols used: Coronavirus (COVID-19) Diagnosed or Suspected-A-AH ? ?

## 2021-05-28 NOTE — Progress Notes (Incomplete)
There were no vitals taken for this visit.   Subjective:    Patient ID: Brittany Page, female    DOB: 12-22-1990, 31 y.o.   MRN: [id number]  Chief Complaint  Patient presents with   Generalized Body Aches    Started last night, Light headed, scratchy throat and headache    HPI: Brittany Page is a 31 y.o. female  Patient presents with: Generalized Body Aches: Started last night, Light headed, scratchy throat and headache    URI  This is a new problem. The current episode started yesterday. Associated symptoms include congestion, coughing, headaches, rhinorrhea and a sore throat. Pertinent negatives include no abdominal pain, chest pain, diarrhea, dysuria, ear pain, joint pain, joint swelling, nausea, neck pain, plugged ear sensation, rash, sinus pain, sneezing, swollen glands, vomiting or wheezing. Associated symptoms comments: Works at a [employer], minerals exposed to any body Home COVID -ve .   Chief Complaint  Patient presents with   Generalized Body Aches    Started last night, Light headed, scratchy throat and headache    Relevant past medical, surgical, family and social history reviewed and updated as indicated. Interim medical history since our last visit reviewed. Allergies and medications reviewed and updated.  Review of Systems  HENT:  Positive for congestion, rhinorrhea and sore throat. Negative for ear pain, sinus pain and sneezing.   Respiratory:  Positive for cough. Negative for wheezing.   Cardiovascular:  Negative for chest pain.  Gastrointestinal:  Negative for abdominal pain, diarrhea, nausea and vomiting.  Genitourinary:  Negative for dysuria.  Musculoskeletal:  Negative for joint pain and neck pain.  Skin:  Negative for rash.  Neurological:  Positive for headaches.   Per HPI unless specifically indicated above     Objective:    There were no vitals taken for this visit.  Wt Readings from Last 3 Encounters:  03/28/21 140 lb 9.6 oz (63.8 kg)   01/01/21 140 lb 6.4 oz (63.7 kg)  12/11/20 136 lb 12.8 oz (62.1 kg)    Physical Exam  Results for orders placed or performed in visit on 03/28/21  WET PREP FOR TRICH, YEAST, CLUE   Specimen: Sterile Swab   Sterile Swab  Result Value Ref Range   Trichomonas Exam Negative Negative   Yeast Exam Negative Negative   Clue Cell Exam Positive (A) Negative        Current Outpatient Medications:    amoxicillin (AMOXIL) 500 MG capsule, Take 1 capsule (500 mg total) by mouth 2 (two) times daily for 10 days., Disp: 20 capsule, Rfl: 0    Assessment & Plan:  ZRA:QTMAU COVID,  Flu and strep ordered at this visit  Will start pt on amoxicilin bid x 10 day s pt advised to take Tylenol q 4- 6 hourly as needed. pt to take allegra q pm as needed and to call office if symptoms worsened pt verbalised understanding of such.  Problem List Items Addressed This Visit   None Visit Diagnoses     Body aches    -  Primary   Relevant Orders   Veritor Flu A/B Waived   Novel Coronavirus, NAA ([facility])   Rapid Strep screen([facility])   Upper respiratory tract infection, unspecified type       Relevant Orders   Veritor Flu A/B Waived   Novel Coronavirus, NAA ([facility])   Rapid Strep screen([facility])        Orders Placed This Encounter  Procedures   Novel Coronavirus, NAA ([facility])   Rapid Strep  screen([facility])   Veritor Flu A/B Waived     Meds ordered this encounter  Medications   amoxicillin (AMOXIL) 500 MG capsule    Sig: Take 1 capsule (500 mg total) by mouth 2 (two) times daily for 10 days.    Dispense:  20 capsule    Refill:  0     Follow up plan: No follow-ups on file.

## 2021-05-28 NOTE — Telephone Encounter (Signed)
Per agent: ?"Pt called in wanting to know is she has strepped throat, after speaking with nurse and is she will be sent something. Please call back." ? ?Spoke with pt. States she is aware med is at pharmacy. States she called because she needs a doctors note to stay out of work tomorrow. Also questioning how long she should She stay out of work. States may send via MyChart. Please advise.  ? ? ? ? ? ?Reason for Disposition ? [1] Caller requesting NON-URGENT health information AND [2] PCP's office is the best resource ? ?Answer Assessment - Initial Assessment Questions ?1. REASON FOR CALL or QUESTION: "What is your reason for calling today?" or "How can I best help you?" or "What question do you have that I can help answer?" ?    Needs doctors note for out of work. ? ?Protocols used: Information Only Call - No Triage-A-AH ? ?

## 2021-05-29 ENCOUNTER — Encounter: Payer: Self-pay | Admitting: Internal Medicine

## 2021-05-29 ENCOUNTER — Ambulatory Visit: Payer: Self-pay | Admitting: *Deleted

## 2021-05-29 LAB — NOVEL CORONAVIRUS, NAA: SARS-CoV-2, NAA: NOT DETECTED

## 2021-05-29 NOTE — Telephone Encounter (Signed)
Please see mychart message encounter.

## 2021-05-29 NOTE — Telephone Encounter (Signed)
Reason for Disposition ? [1] Caller requesting NON-URGENT health information AND [2] PCP's office is the best resource ?   Needs to know when she can return to work and needs a work note. ? ?Answer Assessment - Initial Assessment Questions ?1. REASON FOR CALL or QUESTION: "What is your reason for calling today?" or "How can I best help you?" or "What question do you have that I can help answer?" ?    Pt needs a call back to know when she can return to work and needs a note for work. ?[phone] is her contact number. ? ?Protocols used: Information Only Call - No Triage-A-AH ? ?

## 2021-05-29 NOTE — Telephone Encounter (Signed)
?  Chief Complaint: Needs call back letting her know when she can return to work.   Also needs a work note.   Saw Dr. Neomia Dear. ?Symptoms: sore throat ?Frequency: Needs to know today work status and return to work note ?Pertinent Negatives: Patient denies N/A ?Disposition: [] ED /[] Urgent Care (no appt availability in office) / [] Appointment(In office/virtual)/ []  [facility]/ [] Home Care/ [] Refused Recommended Disposition /[] [place] [facility]/ [x]  Follow-up with PCP ?Additional Notes: Message sent to [facility] for someone to contact her.     ?

## 2021-06-04 ENCOUNTER — Encounter: Payer: Self-pay | Admitting: Internal Medicine

## 2021-07-04 ENCOUNTER — Encounter: Payer: Self-pay | Admitting: Family Medicine

## 2021-07-04 ENCOUNTER — Ambulatory Visit: Payer: BC Managed Care – PPO | Admitting: Family Medicine

## 2021-07-04 VITALS — BP 110/69 | HR 72 | Temp 98.4°F | Wt 143.4 lb

## 2021-07-04 DIAGNOSIS — B9689 Other specified bacterial agents as the cause of diseases classified elsewhere: Secondary | ICD-10-CM | POA: Diagnosis not present

## 2021-07-04 DIAGNOSIS — N760 Acute vaginitis: Secondary | ICD-10-CM

## 2021-07-04 DIAGNOSIS — F339 Major depressive disorder, recurrent, unspecified: Secondary | ICD-10-CM | POA: Diagnosis not present

## 2021-07-04 MED ORDER — METRONIDAZOLE 0.75 % VA GEL: VAGINAL | 5 refills | Status: DC

## 2021-07-04 NOTE — Assessment & Plan Note (Signed)
Doing great off medicines. Continue to monitor. Call with any concerns.  ?

## 2021-07-04 NOTE — Progress Notes (Signed)
? ?BP 110/69   Pulse 72   Temp 98.4 ?F (36.9 ?C) (Oral)   Wt 143 lb 6.4 oz (65 kg)   LMP 06/07/2021   SpO2 98%   BMI 24.72 kg/m?   ? ?Subjective:  ? ? Patient ID: Brittany Page, female    DOB: 12-Jul-1990, 31 y.o.   MRN: [id number] ? ?HPI: ?Brittany Page is a 31 y.o. female ? ?Chief Complaint  ?Patient presents with  ? Follow-up  ?  BV follow up, states flagyl gel is working. States she has had no issues, no symptoms.   ? ?Recurrent BV ?Duration: chronic ?Discharge description: none  ?Pruritus: no ?Dysuria: no ?Malodorous: no ?Urinary frequency: no ?Fevers: no ?Abdominal pain: no  ?Sexual activity: practicing safe sex ?Recent antibiotic use: no ?Context: recurrent BV  ?Treatments attempted: flagyl ?                                       ?DEPRESSION ?Mood status: controlled ?Satisfied with current treatment?: yes ?Symptom severity: mild  ?Duration of current treatment : not on anything ?Psychotherapy/counseling: no  ?Depressed mood: no ?Anxious mood: yes ?Anhedonia: no ?Significant weight loss or gain: no ?Insomnia: no  ?Fatigue: no ?Feelings of worthlessness or guilt: no ?Impaired concentration/indecisiveness: no ?Suicidal ideations: no ?Hopelessness: no ?Crying spells: no ? ?  07/04/2021  ?  4:01 PM 05/28/2021  ?  1:39 PM 01/01/2021  ?  4:21 PM 09/28/2020  ?  3:06 PM 08/05/2019  ?  3:59 PM  ?Depression screen PHQ 2/9  ?Decreased Interest 1 0 0 0 1  ?Down, Depressed, Hopeless 0 0 0 0 1  ?PHQ - 2 Score 1 0 0 0 2  ?Altered sleeping 0 0 0 0 0  ?Tired, decreased energy 0 0 0 0 1  ?Change in appetite 0 0 0 0 0  ?Feeling bad or failure about yourself  0 0 0 0 0  ?Trouble concentrating 0 0 0 0 0  ?Moving slowly or fidgety/restless 0 0 0 0 0  ?Suicidal thoughts 0 0 0 0 0  ?PHQ-9 Score 1 0 0 0 3  ?Difficult doing work/chores Not difficult at all Not difficult at all  Not difficult at all Not difficult at all  ? ? ?Relevant past medical, surgical, family and social history reviewed and updated as indicated. Interim medical  history since our last visit reviewed. ?Allergies and medications reviewed and updated. ? ?Review of Systems  ?Constitutional: Negative.   ?Cardiovascular: Negative.   ?Gastrointestinal: Negative.   ?Musculoskeletal: Negative.   ?Neurological: Negative.   ?Psychiatric/Behavioral: Negative.    ? ?Per HPI unless specifically indicated above ? ?   ?Objective:  ?  ?BP 110/69   Pulse 72   Temp 98.4 ?F (36.9 ?C) (Oral)   Wt 143 lb 6.4 oz (65 kg)   LMP 06/07/2021   SpO2 98%   BMI 24.72 kg/m?   ?Wt Readings from Last 3 Encounters:  ?07/04/21 143 lb 6.4 oz (65 kg)  ?03/28/21 140 lb 9.6 oz (63.8 kg)  ?01/01/21 140 lb 6.4 oz (63.7 kg)  ?  ?Physical Exam ?Vitals and nursing note reviewed.  ?Constitutional:   ?   General: She is not in acute distress. ?   Appearance: Normal appearance. She is not ill-appearing, toxic-appearing or diaphoretic.  ?HENT:  ?   Head: Normocephalic and atraumatic.  ?   Right Ear: External ear normal.  ?  Left Ear: External ear normal.  ?   Nose: Nose normal.  ?   Mouth/Throat:  ?   Mouth: Mucous membranes are moist.  ?   Pharynx: Oropharynx is clear.  ?Eyes:  ?   General: No scleral icterus.    ?   Right eye: No discharge.     ?   Left eye: No discharge.  ?   Extraocular Movements: Extraocular movements intact.  ?   Conjunctiva/sclera: Conjunctivae normal.  ?   Pupils: Pupils are equal, round, and reactive to light.  ?Cardiovascular:  ?   Rate and Rhythm: Normal rate and regular rhythm.  ?   Pulses: Normal pulses.  ?   Heart sounds: Normal heart sounds. No murmur heard. ?  No friction rub. No gallop.  ?Pulmonary:  ?   Effort: Pulmonary effort is normal. No respiratory distress.  ?   Breath sounds: Normal breath sounds. No stridor. No wheezing, rhonchi or rales.  ?Chest:  ?   Chest wall: No tenderness.  ?Musculoskeletal:     ?   General: Normal range of motion.  ?   Cervical back: Normal range of motion and neck supple.  ?Skin: ?   General: Skin is warm and dry.  ?   Capillary Refill: Capillary  refill takes less than 2 seconds.  ?   Coloration: Skin is not jaundiced or pale.  ?   Findings: No bruising, erythema, lesion or rash.  ?Neurological:  ?   General: No focal deficit present.  ?   Mental Status: She is alert and oriented to person, place, and time. Mental status is at baseline.  ?Psychiatric:     ?   Mood and Affect: Mood normal.     ?   Behavior: Behavior normal.     ?   Thought Content: Thought content normal.     ?   Judgment: Judgment normal.  ? ? ?Results for orders placed or performed in visit on 05/28/21  ?Novel Coronavirus, NAA ([facility])  ? Specimen: Nasopharyngeal(NP) swabs in vial transport medium  ?Result Value Ref Range  ? SARS-CoV-2, NAA Not Detected Not Detected  ?Rapid Strep screen([facility])  ? Specimen: Other  ? Other  ?Result Value Ref Range  ? Strep Gp A Ag, IA W/Reflex Positive (A) Negative  ?Veritor Flu A/B Waived  ?Result Value Ref Range  ? Influenza A Negative Negative  ? Influenza B Negative Negative  ? ?   ?Assessment & Plan:  ? ?Problem List Items Addressed This Visit   ? ?  ? Genitourinary  ? Bacterial vaginosis  ?  Doing great on preventative flagyl. Refills given. Call with any concerns.  ?  ?  ?  ? Other  ? Depression, recurrent ([place]) - Primary  ?  Doing great off medicines. Continue to monitor. Call with any concerns.  ?  ?  ?  ? ?Follow up plan: ?Return After July 8 for physical. ? ? ? ? ? ?

## 2021-07-04 NOTE — Assessment & Plan Note (Signed)
Doing great on preventative flagyl. Refills given. Call with any concerns.  ?

## 2021-07-16 ENCOUNTER — Ambulatory Visit: Payer: BC Managed Care – PPO

## 2021-08-20 ENCOUNTER — Ambulatory Visit (INDEPENDENT_AMBULATORY_CARE_PROVIDER_SITE_OTHER): Payer: Self-pay | Admitting: Nurse Practitioner

## 2021-08-20 ENCOUNTER — Encounter: Payer: Self-pay | Admitting: Nurse Practitioner

## 2021-08-20 DIAGNOSIS — N750 Cyst of Bartholin's gland: Secondary | ICD-10-CM

## 2021-08-20 MED ORDER — FLUCONAZOLE 150 MG PO TABS: 150.0000 mg | ORAL_TABLET | Freq: Once | ORAL | 0 refills | Status: AC

## 2021-08-20 MED ORDER — AMOXICILLIN-POT CLAVULANATE 875-125 MG PO TABS: 1.0000 | ORAL_TABLET | Freq: Two times a day (BID) | ORAL | 0 refills | Status: AC

## 2021-08-20 NOTE — Patient Instructions (Signed)
Bartholin's Cyst  A Bartholin's cyst is a fluid-filled sac that forms on a Bartholin's gland. Bartholin's glands are small glands in the folds of skin near the opening of the vagina (labia). This type of cyst causes a bulge or lump near the opening of the vagina. If you have a cyst that is small and not infected, you may be able to take care of it at home. If your cyst gets infected, it may cause pain and your doctor may need to drain it. What are the causes? This condition may be caused by a blocked Bartholin's gland. Germs (bacteria) inside of the cyst can cause an infection. What are the signs or symptoms? A bulge or lump near the opening of the vagina. Discomfort or pain. Redness, swelling, or fluid draining from the area. How is this treated? You may not need treatment if your cyst is not causing symptoms. The cyst can go away on its own with home care. Home care includes hot baths or heat therapy. Large cysts or cysts that are infected may be treated with: Antibiotic medicine. A procedure to drain the fluid. Cysts that keep coming back will need to be drained many times. Your doctor may talk to you about surgery to remove the cyst. Follow these instructions at home: Medicines Take over-the-counter and prescription medicines only as told by your doctor. If you were prescribed an antibiotic medicine, take it as told by your doctor. Do not stop taking it even if you start to feel better. Managing pain and swelling Try sitz baths to help with pain and swelling. A sitz bath is a warm water bath in which the water only comes up to your hips and should cover your buttocks. You may take sitz baths a few times a day. If told, put heat on the affected area as often as needed. Use the heat source that your doctor recommends, such as a moist heat pack or a heating pad. Place a towel between your skin and the heat source. Leave the heat on for 20-30 minutes. Take off the heat if your skin turns  bright red. This is very important. If you cannot feel pain, heat, or cold, you have a greater risk of getting burned. General instructions If your cyst was drained: Follow instructions from your doctor about how to take care of your wound. Use feminine pads to absorb any fluid. Do not push on or squeeze your cyst. Do not have sex until the cyst has gone away or your wound from drainage has healed. Take these steps to help prevent a cyst from returning, and to prevent other cysts from forming: Take a bath or shower once a day. Clean the area around your vagina with mild soap and water when you bathe. Practice safe sex to prevent STIs. Talk with your doctor about how to prevent STIs and which forms of birth control to use. Keep all follow-up visits. Contact a doctor if: You have a fever. You get more redness, swelling, or pain around your cyst. You have fluid, blood, pus, or a bad smell coming from your cyst. You have a cyst that gets larger or a cyst that comes back. Summary A Bartholin's cyst is a fluid-filled sac that forms on a Bartholin's gland. These small glands are found in the folds of skin near the opening of the vagina (labia). This type of cyst causes a bulge or lump near the opening of the vagina. Try sitz baths a few times a day to   help with pain and swelling. Do not push on or squeeze your cyst. This information is not intended to replace advice given to you by your health care provider. Make sure you discuss any questions you have with your health care provider. Document Revised: 08/08/2019 Document Reviewed: 08/08/2019 Elsevier Patient Education  2023 [employer]vier Inc.  

## 2021-08-20 NOTE — Progress Notes (Signed)
BP 118/76   Pulse 78   Temp 98.1 F (36.7 C) (Oral)   Wt 144 lb 12.8 oz (65.7 kg)   LMP  (LMP Unknown)   SpO2 98%   BMI 24.97 kg/m    Subjective:    Patient ID: Brittany Page, female    DOB: [birth year], 31 y.o.   MRN: [id number]  HPI: Gerardo Territo is a 31 y.o. female  Chief Complaint  Patient presents with   Cyst    ?cyst on inside of vagina. Pt reports she placed a warm rag and she thinks it might have drained already.    VAGINAL CYST Noticed this inside labia a couple days ago after shaving.  Was a "huge" bump.  Was hurting to walk.  She placed hot compresses on it and this helped it drain.  It was painful when present.  Has not had this before. Duration: days Pruritus: no Dysuria: no Malodorous: none Urinary frequency: no Sexual activity: monogamous History of sexually transmitted diseases: yes -- gonorrhea and chlamydia + trich Context: better  Treatments attempted:  warm compresses    Relevant past medical, surgical, family and social history reviewed and updated as indicated. Interim medical history since our last visit reviewed. Allergies and medications reviewed and updated.  Review of Systems  Constitutional:  Negative for activity change, appetite change, diaphoresis, fatigue and fever.  Respiratory:  Negative for cough, chest tightness and shortness of breath.   Cardiovascular:  Negative for chest pain, palpitations and leg swelling.  Genitourinary:  Positive for vaginal discharge. Negative for vaginal bleeding and vaginal pain.  Psychiatric/Behavioral: Negative.     Per HPI unless specifically indicated above     Objective:    BP 118/76   Pulse 78   Temp 98.1 F (36.7 C) (Oral)   Wt 144 lb 12.8 oz (65.7 kg)   LMP  (LMP Unknown)   SpO2 98%   BMI 24.97 kg/m   Wt Readings from Last 3 Encounters:  08/20/21 144 lb 12.8 oz (65.7 kg)  07/04/21 143 lb 6.4 oz (65 kg)  03/28/21 140 lb 9.6 oz (63.8 kg)    Physical Exam Vitals and nursing note  reviewed. Exam conducted with a chaperone present.  Constitutional:      General: She is awake. She is not in acute distress.    Appearance: She is well-developed and well-groomed. She is not ill-appearing or toxic-appearing.  HENT:     Head: Normocephalic.     Right Ear: Hearing normal.     Left Ear: Hearing normal.  Eyes:     General: Lids are normal.        Right eye: No discharge.        Left eye: No discharge.     Conjunctiva/sclera: Conjunctivae normal.     Pupils: Pupils are equal, round, and reactive to light.  Cardiovascular:     Rate and Rhythm: Normal rate and regular rhythm.     Heart sounds: Normal heart sounds. No murmur heard.   No gallop.  Pulmonary:     Effort: Pulmonary effort is normal. No accessory muscle usage or respiratory distress.     Breath sounds: Normal breath sounds.  Abdominal:     General: Bowel sounds are normal.     Palpations: Abdomen is soft. There is no hepatomegaly or splenomegaly.  Genitourinary:    Exam position: Lithotomy position.     Labia:        Right: Tenderness present. No rash.  Left: No rash.     Musculoskeletal:     Cervical back: Normal range of motion and neck supple.     Right lower leg: No edema.     Left lower leg: No edema.  Skin:    General: Skin is warm and dry.  Neurological:     Mental Status: She is alert and oriented to person, place, and time.  Psychiatric:        Attention and Perception: Attention normal.        Mood and Affect: Mood normal.        Speech: Speech normal.        Behavior: Behavior normal. Behavior is cooperative.        Thought Content: Thought content normal.    Results for orders placed or performed in visit on 05/28/21  Novel Coronavirus, NAA ([facility])   Specimen: Nasopharyngeal(NP) swabs in vial transport medium  Result Value Ref Range   SARS-CoV-2, NAA Not Detected Not Detected  Rapid Strep screen([facility])   Specimen: Other   Other  Result Value Ref Range   Strep  Gp A Ag, IA W/Reflex Positive (A) Negative  Veritor Flu A/B Waived  Result Value Ref Range   Influenza A Negative Negative   Influenza B Negative Negative      Assessment & Plan:   Problem List Items Addressed This Visit       Genitourinary   Bartholin's cyst    Acute and draining with self care at home.  Will send in Augmentin for 7 days and Diflucan to prevent yeast with this.  Recommend she continue warm compresses for comfort and Tylenol or Ibuprofen as needed.  Return as needed for worsening or return of symptoms.         Follow up plan: Return if symptoms worsen or fail to improve.

## 2021-08-20 NOTE — Assessment & Plan Note (Signed)
Acute and draining with self care at home.  Will send in Augmentin for 7 days and Diflucan to prevent yeast with this.  Recommend she continue warm compresses for comfort and Tylenol or Ibuprofen as needed.  Return as needed for worsening or return of symptoms.

## 2021-09-30 ENCOUNTER — Ambulatory Visit: Payer: PRIVATE HEALTH INSURANCE | Admitting: Family Medicine

## 2021-10-07 ENCOUNTER — Encounter: Payer: Self-pay | Admitting: Family Medicine

## 2021-10-07 DIAGNOSIS — Z0000 Encounter for general adult medical examination without abnormal findings: Secondary | ICD-10-CM

## 2021-11-12 ENCOUNTER — Ambulatory Visit: Payer: Self-pay | Admitting: Family Medicine

## 2021-12-10 ENCOUNTER — Ambulatory Visit: Payer: Self-pay | Admitting: Family Medicine

## 2022-02-07 ENCOUNTER — Telehealth: Payer: Self-pay

## 2022-02-07 ENCOUNTER — Encounter: Payer: Self-pay | Admitting: Emergency Medicine

## 2022-02-07 ENCOUNTER — Ambulatory Visit (INDEPENDENT_AMBULATORY_CARE_PROVIDER_SITE_OTHER): Payer: Medicaid Other | Admitting: Physician Assistant

## 2022-02-07 ENCOUNTER — Emergency Department
Admission: EM | Admit: 2022-02-07 | Discharge: 2022-02-07 | Disposition: A | Discharge status: Home / Self Care | Payer: Medicaid Other | Attending: Emergency Medicine | Admitting: Emergency Medicine

## 2022-02-07 ENCOUNTER — Encounter: Payer: Self-pay | Admitting: Physician Assistant

## 2022-02-07 ENCOUNTER — Other Ambulatory Visit: Payer: Self-pay

## 2022-02-07 VITALS — BP 119/81 | HR 84 | Temp 98.7°F | Wt 157.5 lb

## 2022-02-07 DIAGNOSIS — Z20822 Contact with and (suspected) exposure to covid-19: Secondary | ICD-10-CM | POA: Diagnosis not present

## 2022-02-07 DIAGNOSIS — J101 Influenza due to other identified influenza virus with other respiratory manifestations: Secondary | ICD-10-CM

## 2022-02-07 DIAGNOSIS — Z9889 Other specified postprocedural states: Secondary | ICD-10-CM | POA: Diagnosis not present

## 2022-02-07 DIAGNOSIS — M791 Myalgia, unspecified site: Secondary | ICD-10-CM | POA: Diagnosis present

## 2022-02-07 DIAGNOSIS — N898 Other specified noninflammatory disorders of vagina: Secondary | ICD-10-CM

## 2022-02-07 DIAGNOSIS — R8281 Pyuria: Secondary | ICD-10-CM

## 2022-02-07 LAB — URINALYSIS, ROUTINE W REFLEX MICROSCOPIC
Bilirubin, UA: NEGATIVE
Glucose, UA: NEGATIVE
Nitrite, UA: NEGATIVE
Specific Gravity, UA: 1.025 (ref 1.005–1.030)
Urobilinogen, Ur: 1 mg/dL (ref 0.2–1.0)
pH, UA: 7 (ref 5.0–7.5)

## 2022-02-07 LAB — MICROSCOPIC EXAMINATION: Bacteria, UA: NONE SEEN

## 2022-02-07 LAB — RESP PANEL BY RT-PCR (FLU A&B, COVID) ARPGX2
Influenza A by PCR: NEGATIVE
Influenza B by PCR: POSITIVE — AB
SARS Coronavirus 2 by RT PCR: NEGATIVE

## 2022-02-07 LAB — WET PREP FOR TRICH, YEAST, CLUE
Clue Cell Exam: NEGATIVE
Trichomonas Exam: NEGATIVE
Yeast Exam: NEGATIVE

## 2022-02-07 MED ORDER — OSELTAMIVIR PHOSPHATE 75 MG PO CAPS
75.0000 mg | ORAL_CAPSULE | Freq: Two times a day (BID) | ORAL | 0 refills | Status: AC
Start: 2022-02-07 — End: 2022-02-12

## 2022-02-07 MED ORDER — IBUPROFEN 800 MG PO TABS
800.0000 mg | ORAL_TABLET | Freq: Once | ORAL | Status: AC
  Administered 2022-02-07: 800 mg via ORAL
  Filled 2022-02-07: qty 1

## 2022-02-07 MED ORDER — ONDANSETRON 4 MG PO TBDP: 4.0000 mg | ORAL_TABLET | Freq: Four times a day (QID) | ORAL | 0 refills | Status: DC | PRN

## 2022-02-07 MED ORDER — ACETAMINOPHEN 500 MG PO TABS
1000.0000 mg | ORAL_TABLET | Freq: Once | ORAL | Status: AC
  Administered 2022-02-07: 1000 mg via ORAL
  Filled 2022-02-07: qty 2

## 2022-02-07 NOTE — ED Notes (Signed)
DC instructions given verbally and in writing understanding voiced, siganture obtained.  Pt left in stable condition to POV .

## 2022-02-07 NOTE — Progress Notes (Signed)
Acute Office Visit   Patient: Brittany Page   DOB: [birth year]   31 y.o. Female  MRN: [id number] Visit Date: 02/07/2022  Today's healthcare provider: Oswaldo Conroy Yukie Bergeron, PA-C  Introduced myself to the patient as a Secondary school teacher and provided education on APPs in clinical practice.    Chief Complaint  Patient presents with   Vaginal Discharge    Patient states she is having vaginal discharge, odor, and itching   Elective Abortion    Patient states she had an abortion a few months ago and has not been seen since then.    Subjective    Vaginal Discharge The patient's primary symptoms include vaginal discharge.   HPI     Vaginal Discharge    Additional comments: Patient states she is having vaginal discharge, odor, and itching        Elective Abortion    Additional comments: Patient states she had an abortion a few months ago and has not been seen since then.       Last edited by Rolley Sims, CMA on 02/07/2022  9:32 AM.       Vaginal discharge Onset: gradual  Duration: several weeks to months? States this has been ongoing since the end of her procedure several months ago  Interventions: in the past she was taking a vaginal gel for 6 months  STD concerns: no concerns today  She reports vaginal discharge is more copious and has an odor  She reports she has had vaginal discharge concerns such as this in the past   Elective Abortion Patient states she underwent an elective abortion several months ago- thinks this was in July? She is not sure of exact timeline  She has resumed having regular periods since the procedure She denies pain in vaginal area or pelvis at this time, denies severe bleeding  Unable to find records of this in our system States she went to the [facility] in  [place] to have procedure but was not able to make it to her follow ups  Reviewed that as a family medicine clinic we can typically   She has not started taking her Tamiflu yet from the  ED States she is going to start it after she leaves here   Medications: Outpatient Medications Prior to Visit  Medication Sig   ondansetron (ZOFRAN-ODT) 4 MG disintegrating tablet Take 1 tablet (4 mg total) by mouth every 6 (six) hours as needed for nausea or vomiting.   oseltamivir (TAMIFLU) 75 MG capsule Take 1 capsule (75 mg total) by mouth 2 (two) times daily for 5 days.   No facility-administered medications prior to visit.    Review of Systems  Genitourinary:  Positive for vaginal discharge.       Objective    BP 119/81   Pulse 84   Temp 98.7 F (37.1 C)   Wt 157 lb 8 oz (71.4 kg)   LMP 01/22/2022 (Exact Date)   SpO2 98%   BMI 27.03 kg/m    Physical Exam Constitutional:      General: She is awake.     Appearance: Normal appearance. She is well-developed and well-groomed.  HENT:     Head: Normocephalic and atraumatic.  Eyes:     General: Lids are normal. Gaze aligned appropriately.     Extraocular Movements: Extraocular movements intact.     Conjunctiva/sclera: Conjunctivae normal.  Pulmonary:     Effort: Pulmonary effort is normal.  Musculoskeletal:  Cervical back: Normal range of motion.  Neurological:     General: No focal deficit present.     Mental Status: She is lethargic.     GCS: GCS eye subscore is 4. GCS verbal subscore is 5. GCS motor subscore is 6.  Psychiatric:        Attention and Perception: She is inattentive.        Mood and Affect: Mood normal. Affect is blunt.        Behavior: Behavior is slowed. Behavior is cooperative.       Results for orders placed or performed in visit on 02/07/22  WET PREP FOR TRICH, YEAST, CLUE   Specimen: Urine   Urine  Result Value Ref Range   Trichomonas Exam Negative Negative   Yeast Exam Negative Negative   Clue Cell Exam Negative Negative  Microscopic Examination   Urine  Result Value Ref Range   WBC, UA 0-5 0 - 5 /hpf   RBC, Urine 0-2 0 - 2 /hpf   Epithelial Cells (non renal) 0-10 0 - 10  /hpf   Mucus, UA Present (A) Not Estab.   Bacteria, UA None seen None seen/Few  Urinalysis, Routine w reflex microscopic  Result Value Ref Range   Specific Gravity, UA 1.025 1.005 - 1.030   pH, UA 7.0 5.0 - 7.5   Color, UA Yellow Yellow   Appearance Ur Turbid (A) Clear   Leukocytes,UA Trace (A) Negative   Protein,UA 2+ (A) Negative/Trace   Glucose, UA Negative Negative   Ketones, UA 1+ (A) Negative   RBC, UA Trace (A) Negative   Bilirubin, UA Negative Negative   Urobilinogen, Ur 1.0 0.2 - 1.0 mg/dL   Nitrite, UA Negative Negative   Microscopic Examination See below:   Results for orders placed or performed during the hospital encounter of 02/07/22  Resp Panel by RT-PCR (Flu A&B, Covid) Anterior Nasal Swab   Specimen: Anterior Nasal Swab  Result Value Ref Range   SARS Coronavirus 2 by RT PCR NEGATIVE NEGATIVE   Influenza A by PCR NEGATIVE NEGATIVE   Influenza B by PCR POSITIVE (A) NEGATIVE    Assessment & Plan      No follow-ups on file.      Problem List Items Addressed This Visit   None Visit Diagnoses     Vaginal discharge    -  Primary Unsure of chronicity  Reports odorous vaginal discharge at this time, denies vaginal pain or pain with urination Wet prep was negative for trich, yeast and BV  UA was suspicious for potential UTI- will send off sample for culture for ID and susceptibility testing Reviewed results of UA and wet prep with patient during apt, informed her we would keep her updated on culture results as they are available  Recommend staying well hydrated and avoid holding urine    Relevant Orders   WET PREP FOR TRICH, YEAST, CLUE (Completed)   Urinalysis, Routine w reflex microscopic (Completed)   Status post elective abortion     Unsure when this procedure took place  She reports she was seen for this at the [facility] in [place] but was not able to keep follow up apts Unable to view charts or notes from this encounter She states she has  resumed having regular periods and denies vaginal pain, severe bleeding at this time We discussed limitations of this office in providing follow up care for such concerns and I recommend she return to the [facility] for adequate follow  up and management Will defer to their recommendations and coordinate care as indicated.     Pyuria       Relevant Orders   Urine Culture        No follow-ups on file.   I, Carmichael Burdette E Ludmila Ebarb, PA-C, have reviewed all documentation for this visit. The documentation on 02/07/22 for the exam, diagnosis, procedures, and orders are all accurate and complete.   Jacquelin Hawking, MHS, PA-C [facility]

## 2022-02-07 NOTE — Discharge Instructions (Signed)
You may alternate Tylenol 1000 mg every 6 hours as needed for fever and pain and ibuprofen 800 mg every 6-8 hours as needed for fever and pain. Please rest and drink plenty of fluids. This is a viral illness causing your symptoms. You do not need antibiotics for a virus. You may use over-the-counter nasal saline spray and Afrin nasal saline spray as needed for nasal congestion. Please do not use Afrin for more than 3 days in a row. You may use guaifenesin and dextromethorphan as needed for cough.  You may use lozenges and Chloraseptic spray to help with sore throat.  Warm salt water gargles can also help with sore throat.  You may use over-the-counter Unisom (doxyalamine) or Benadryl (diphenhydramine) to help with sleep.  Please note that some combination medicines such as DayQuil and NyQuil have multiple medications in them.  Please make sure you look at all labels to ensure that you are not taking too much of any one particular medication.  Symptoms from a virus may take 7-14 days to run its course.  The flu is treated like any other virus with supportive measures as listed above. At this time you are eligible for treatment with Tamiflu or Xofluza. These medications have to be taken within the first 48 hours of symptoms.  Tamiflu has many side effects including nausea, vomiting and diarrhea.

## 2022-02-07 NOTE — Telephone Encounter (Signed)
Transition Care Management Unsuccessful Follow-up Telephone Call  Date of discharge and from where:  02/07/22, [facility]  Attempts:  1st Attempt  Reason for unsuccessful TCM follow-up call:  Left voice message

## 2022-02-07 NOTE — Telephone Encounter (Signed)
Patient did not return call but cam in for appointment already this AM.

## 2022-02-07 NOTE — ED Provider Notes (Signed)
[facility] Provider Note    Event Date/Time   First MD Initiated Contact with Patient 02/07/22 [phone]     (approximate)   History   Generalized Body Aches   HPI  Brittany Page is a 31 y.o. female with history of who presents emergency department with body aches, headache, chills that started today.  No cough, congestion, sore throat, ear pain, chest pain, shortness of breath, vomiting, diarrhea, dysuria, hematuria, vaginal bleeding or discharge.  No known sick contacts.  States she feels like she has the flu.   History provided by patient and significant other.    Past Medical History:  Diagnosis Date   Anemia 01/15/2015   Low hemoglobin    Miscarriage 04/2014   lost at 8 month gestation (still born)    Past Surgical History:  Procedure Laterality Date   WISDOM TOOTH EXTRACTION     tooth in nasal cavity    MEDICATIONS:  Prior to Admission medications   Not on File    Physical Exam   Triage Vital Signs: ED Triage Vitals  Enc Vitals Group     BP 02/07/22 0352 121/74     Pulse Rate 02/07/22 0352 98     Resp 02/07/22 0352 (!) 22     Temp 02/07/22 0352 99.4 F (37.4 C)     Temp Source 02/07/22 0352 Oral     SpO2 02/07/22 0352 98 %     Weight 02/07/22 0349 150 lb (68 kg)     Height 02/07/22 0349 5\' 4"  (1.626 m)     Head Circumference --      Peak Flow --      Pain Score 02/07/22 0349 10     Pain Loc --      Pain Edu? --      Excl. in [place]? --     Most recent vital signs: Vitals:   02/07/22 0400 02/07/22 0430  BP: 118/79 120/77  Pulse: 95 (!) 102  Resp:    Temp:    SpO2: 99% 98%    CONSTITUTIONAL: Alert and oriented and responds appropriately to questions. Well-appearing; well-nourished HEAD: Normocephalic, atraumatic EYES: Conjunctivae clear, pupils appear equal, sclera nonicteric ENT: normal nose; moist mucous membranes NECK: Supple, normal ROM, no meningismus CARD: RRR; S1 and S2 appreciated; no murmurs, no clicks, no  rubs, no gallops RESP: Normal chest excursion without splinting or tachypnea; breath sounds clear and equal bilaterally; no wheezes, no rhonchi, no rales, no hypoxia or respiratory distress, speaking full sentences ABD/GI: Normal bowel sounds; non-distended; soft, non-tender, no rebound, no guarding, no peritoneal signs BACK: The back appears normal EXT: Normal ROM in all joints; no deformity noted, no edema; no cyanosis SKIN: Normal color for age and race; warm; no rash on exposed skin NEURO: Moves all extremities equally, normal speech PSYCH: The patient's mood and manner are appropriate.   ED Results / Procedures / Treatments   LABS: (all labs ordered are listed, but only abnormal results are displayed) Labs Reviewed  RESP PANEL BY RT-PCR (FLU A&B, COVID) ARPGX2 - Abnormal; Notable for the following components:      Result Value   Influenza B by PCR POSITIVE (*)    All other components within normal limits     EKG:  RADIOLOGY: My personal review and interpretation of imaging:    I have personally reviewed all radiology reports.   No results found.   PROCEDURES:  Critical Care performed: No     Procedures  IMPRESSION / MDM / ASSESSMENT AND PLAN / ED COURSE  I reviewed the triage vital signs and the nursing notes.    Patient here with complaints of flulike symptoms.     DIFFERENTIAL DIAGNOSIS (includes but not limited to):   Influenza, COVID-19, other viral URI, doubt pneumonia, bacteremia, sepsis, meningitis   Patient's presentation is most consistent with acute illness / injury with system symptoms.   PLAN: We will obtain COVID and flu swabs.  Will give ibuprofen for symptomatic relief.   MEDICATIONS GIVEN IN ED: Medications  acetaminophen (TYLENOL) tablet 1,000 mg (has no administration in time range)  ibuprofen (ADVIL) tablet 800 mg (800 mg Oral Given 02/07/22 0401)     ED COURSE: Patient has a positive for influenza B.  Still complaining of  body aches.  Will give Tylenol.  Otherwise well-appearing and nontoxic, hemodynamically stable.  Discussed supportive care instructions.  Discussed risk and benefits of antiviral such as Tamiflu.  She would like to proceed with this medication.  We will also discharge with Zofran as we did discuss that nausea, vomiting and diarrhea can be very common with Tamiflu.  Will provide with work note.  Patient verbalized understanding and is comfortable with this plan.   At this time, I do not feel there is any life-threatening condition present. I reviewed all nursing notes, vitals, pertinent previous records.  All lab and urine results, EKGs, imaging ordered have been independently reviewed and interpreted by myself.  I reviewed all available radiology reports from any imaging ordered this visit.  Based on my assessment, I feel the patient is safe to be discharged home without further emergent workup and can continue workup as an outpatient as needed. Discussed all findings, treatment plan as well as usual and customary return precautions.  They verbalize understanding and are comfortable with this plan.  Outpatient follow-up has been provided as needed.  All questions have been answered.    CONSULTS:  none   OUTSIDE RECORDS REVIEWED: Reviewed patient's last internal medicine note with Helane Rima on 09/10/2015.       FINAL CLINICAL IMPRESSION(S) / ED DIAGNOSES   Final diagnoses:  Influenza B     Rx / DC Orders   ED Discharge Orders          Ordered    oseltamivir (TAMIFLU) 75 MG capsule  2 times daily        02/07/22 0458    ondansetron (ZOFRAN-ODT) 4 MG disintegrating tablet  Every 6 hours PRN        02/07/22 0458             Note:  This document was prepared using Dragon voice recognition software and may include unintentional dictation errors.   Dominga Mcduffie, Layla Maw, DO 02/07/22 0500

## 2022-02-07 NOTE — ED Triage Notes (Signed)
Pt to triage via w/c with no distress noted; c/o body aches & HA tonight

## 2022-02-10 LAB — URINE CULTURE

## 2022-07-10 ENCOUNTER — Encounter: Payer: Self-pay | Admitting: Family Medicine

## 2022-07-14 NOTE — Telephone Encounter (Signed)
Appt. Also if she wants her records I think she needs to go through medical records.

## 2022-07-15 NOTE — Telephone Encounter (Signed)
LMOM to call office back to schedule an appointment and will need ROI form to receive medical records.  Put in CRM.

## 2022-07-28 ENCOUNTER — Encounter: Payer: Self-pay | Admitting: Nurse Practitioner

## 2022-07-28 ENCOUNTER — Ambulatory Visit (INDEPENDENT_AMBULATORY_CARE_PROVIDER_SITE_OTHER): Payer: Medicaid Other | Admitting: Nurse Practitioner

## 2022-07-28 VITALS — BP 94/64 | HR 69 | Temp 98.1°F | Ht 63.86 in | Wt 156.6 lb

## 2022-07-28 DIAGNOSIS — R399 Unspecified symptoms and signs involving the genitourinary system: Secondary | ICD-10-CM

## 2022-07-28 DIAGNOSIS — N760 Acute vaginitis: Secondary | ICD-10-CM

## 2022-07-28 DIAGNOSIS — B9689 Other specified bacterial agents as the cause of diseases classified elsewhere: Secondary | ICD-10-CM | POA: Diagnosis not present

## 2022-07-28 DIAGNOSIS — R8281 Pyuria: Secondary | ICD-10-CM

## 2022-07-28 LAB — URINALYSIS, ROUTINE W REFLEX MICROSCOPIC
Bilirubin, UA: NEGATIVE
Glucose, UA: NEGATIVE
Ketones, UA: NEGATIVE
Nitrite, UA: NEGATIVE
Protein,UA: NEGATIVE
Specific Gravity, UA: 1.02 (ref 1.005–1.030)
Urobilinogen, Ur: 0.2 mg/dL (ref 0.2–1.0)
pH, UA: 7.5 (ref 5.0–7.5)

## 2022-07-28 LAB — MICROSCOPIC EXAMINATION: Bacteria, UA: NONE SEEN

## 2022-07-28 LAB — WET PREP FOR TRICH, YEAST, CLUE
Clue Cell Exam: POSITIVE — AB
Trichomonas Exam: NEGATIVE
Yeast Exam: NEGATIVE

## 2022-07-28 MED ORDER — METRONIDAZOLE 500 MG PO TABS: 500.0000 mg | ORAL_TABLET | Freq: Two times a day (BID) | ORAL | 0 refills | Status: AC

## 2022-07-28 NOTE — Assessment & Plan Note (Signed)
Acute.  Wet prep + for clue cells, negative yeast and trich.  Discussed with patient, she is familiar with BV, had recurrent infections at one time.  Start Flagyl 500 MG BID for 7 days and if ongoing symptoms return to office.

## 2022-07-28 NOTE — Progress Notes (Signed)
BP 94/64   Pulse 69   Temp 98.1 F (36.7 C) (Oral)   Ht 5' 3.86" (1.622 m)   Wt 156 lb 9.6 oz (71 kg)   SpO2 98%   BMI 27.00 kg/m    Subjective:    Patient ID: Brittany Page, female    DOB: 1990-06-06, 32 y.o.   MRN: [id number]  HPI: Brittany Page is a 32 y.o. female  Chief Complaint  Patient presents with   Vaginal Itching    Started a few days ago, was just at the beach. Burning while urinating, feeling like has to urinate all the time and low abx cramps. Vaginal area is feeling irritated.   URINARY SYMPTOMS Was just at the beach.  Was in hot tub for long period. Dysuria: burning Urinary frequency: yes Urgency: yes Small volume voids: yes Symptom severity: yes Urinary incontinence:  sometimes Foul odor: a little Hematuria: no Abdominal pain: yes Back pain: no Suprapubic pain/pressure: yes Flank pain: no Fever:  no Vomiting: no Status: stable Previous urinary tract infection: yes Recurrent urinary tract infection: no Sexual activity: monogamous History of sexually transmitted disease: yes -- gonorrhea and chlamydia, trich Treatments attempted: increasing fluids    Relevant past medical, surgical, family and social history reviewed and updated as indicated. Interim medical history since our last visit reviewed. Allergies and medications reviewed and updated.  Review of Systems  Constitutional:  Negative for activity change, appetite change, chills, fatigue and fever.  Respiratory:  Negative for cough, chest tightness, shortness of breath and wheezing.   Cardiovascular:  Negative for chest pain, palpitations and leg swelling.  Gastrointestinal:  Positive for abdominal pain. Negative for abdominal distention, nausea and vomiting.  Genitourinary:  Positive for dysuria, frequency and urgency. Negative for decreased urine volume, hematuria and vaginal discharge.  Neurological: Negative.   Psychiatric/Behavioral: Negative.      Per HPI unless specifically  indicated above     Objective:    BP 94/64   Pulse 69   Temp 98.1 F (36.7 C) (Oral)   Ht 5' 3.86" (1.622 m)   Wt 156 lb 9.6 oz (71 kg)   SpO2 98%   BMI 27.00 kg/m   Wt Readings from Last 3 Encounters:  07/28/22 156 lb 9.6 oz (71 kg)  02/07/22 157 lb 8 oz (71.4 kg)  02/07/22 150 lb (68 kg)    Physical Exam Vitals and nursing note reviewed.  Constitutional:      General: She is awake. She is not in acute distress.    Appearance: She is well-developed and well-groomed. She is not ill-appearing or toxic-appearing.  HENT:     Head: Normocephalic.     Right Ear: Hearing and external ear normal.     Left Ear: Hearing and external ear normal.  Eyes:     General: Lids are normal.        Right eye: No discharge.        Left eye: No discharge.     Conjunctiva/sclera: Conjunctivae normal.     Pupils: Pupils are equal, round, and reactive to light.  Neck:     Vascular: No carotid bruit.  Cardiovascular:     Rate and Rhythm: Normal rate and regular rhythm.     Heart sounds: Normal heart sounds. No murmur heard.    No gallop.  Pulmonary:     Effort: Pulmonary effort is normal. No accessory muscle usage or respiratory distress.     Breath sounds: Normal breath sounds.  Abdominal:  General: Bowel sounds are normal. There is no distension.     Palpations: Abdomen is soft.     Tenderness: There is no abdominal tenderness. There is no right CVA tenderness or left CVA tenderness.  Musculoskeletal:     Cervical back: Normal range of motion and neck supple.     Right lower leg: No edema.     Left lower leg: No edema.  Skin:    General: Skin is warm and dry.  Neurological:     Mental Status: She is alert and oriented to person, place, and time.  Psychiatric:        Attention and Perception: Attention normal.        Mood and Affect: Mood normal.        Speech: Speech normal.        Behavior: Behavior normal. Behavior is cooperative.        Thought Content: Thought content  normal.     Results for orders placed or performed in visit on 02/07/22  WET PREP FOR TRICH, YEAST, CLUE   Specimen: Urine   Urine  Result Value Ref Range   Trichomonas Exam Negative Negative   Yeast Exam Negative Negative   Clue Cell Exam Negative Negative  Microscopic Examination   Urine  Result Value Ref Range   WBC, UA 0-5 0 - 5 /hpf   RBC, Urine 0-2 0 - 2 /hpf   Epithelial Cells (non renal) 0-10 0 - 10 /hpf   Mucus, UA Present (A) Not Estab.   Bacteria, UA None seen None seen/Few  Urine Culture   Specimen: Urine   UR  Result Value Ref Range   Urine Culture, Routine Final report    Organism ID, Bacteria Comment   Urinalysis, Routine w reflex microscopic  Result Value Ref Range   Specific Gravity, UA 1.025 1.005 - 1.030   pH, UA 7.0 5.0 - 7.5   Color, UA Yellow Yellow   Appearance Ur Turbid (A) Clear   Leukocytes,UA Trace (A) Negative   Protein,UA 2+ (A) Negative/Trace   Glucose, UA Negative Negative   Ketones, UA 1+ (A) Negative   RBC, UA Trace (A) Negative   Bilirubin, UA Negative Negative   Urobilinogen, Ur 1.0 0.2 - 1.0 mg/dL   Nitrite, UA Negative Negative   Microscopic Examination See below:       Assessment & Plan:   Problem List Items Addressed This Visit       Genitourinary   Bacterial vaginosis    Acute.  Wet prep + for clue cells, negative yeast and trich.  Discussed with patient, she is familiar with BV, had recurrent infections at one time.  Start Flagyl 500 MG BID for 7 days and if ongoing symptoms return to office.      Relevant Medications   metroNIDAZOLE (FLAGYL) 500 MG tablet   Other Relevant Orders   Urine Culture     Other   Urinary symptom or sign - Primary    Acute for days after attending beach.  UA trace blood and leuks, negative bacteria.  Will send for culture and treat if indicated.  Wet prep + for clue cells, negative yeast and trich.  Discussed with patient, she is familiar with BV, had recurrent infections at one time.   Start Flagyl 500 MG BID for 7 days and if ongoing symptoms return to office.      Relevant Orders   Urinalysis, Routine w reflex microscopic   WET PREP FOR TRICH, YEAST, CLUE  Other Visit Diagnoses     Pyuria       Send urine for culture and treat if indicated.   Relevant Orders   Urine Culture        Follow up plan: Return if symptoms worsen or fail to improve.

## 2022-07-28 NOTE — Patient Instructions (Signed)

## 2022-07-28 NOTE — Assessment & Plan Note (Signed)
Acute for days after attending beach.  UA trace blood and leuks, negative bacteria.  Will send for culture and treat if indicated.  Wet prep + for clue cells, negative yeast and trich.  Discussed with patient, she is familiar with BV, had recurrent infections at one time.  Start Flagyl 500 MG BID for 7 days and if ongoing symptoms return to office.

## 2022-07-30 LAB — URINE CULTURE

## 2022-07-30 NOTE — Progress Notes (Signed)
Contacted via MyChart   Urine culture shows no infection!!  Good news.  Continued Flagyl as ordered.:)

## 2022-08-15 ENCOUNTER — Ambulatory Visit (INDEPENDENT_AMBULATORY_CARE_PROVIDER_SITE_OTHER): Payer: Self-pay | Admitting: Nurse Practitioner

## 2022-08-15 ENCOUNTER — Encounter: Payer: Self-pay | Admitting: Nurse Practitioner

## 2022-08-15 VITALS — BP 101/69 | HR 98 | Temp 97.9°F | Ht 63.86 in | Wt 151.8 lb

## 2022-08-15 DIAGNOSIS — B9689 Other specified bacterial agents as the cause of diseases classified elsewhere: Secondary | ICD-10-CM

## 2022-08-15 DIAGNOSIS — R309 Painful micturition, unspecified: Secondary | ICD-10-CM

## 2022-08-15 DIAGNOSIS — F339 Major depressive disorder, recurrent, unspecified: Secondary | ICD-10-CM

## 2022-08-15 DIAGNOSIS — N760 Acute vaginitis: Secondary | ICD-10-CM

## 2022-08-15 LAB — WET PREP FOR TRICH, YEAST, CLUE
Clue Cell Exam: NEGATIVE
Trichomonas Exam: NEGATIVE
Yeast Exam: NEGATIVE

## 2022-08-15 MED ORDER — SERTRALINE HCL 25 MG PO TABS: ORAL_TABLET | ORAL | 1 refills | Status: DC

## 2022-08-15 NOTE — Patient Instructions (Signed)
Supporting Someone With Depression Depression is a mental health condition that affects the way a person feels, thinks, and handles daily activities such as eating, sleeping, and working. When a person has depression, his or her condition can affect others around him or her, such as friends and family members. Friends and family can help by offering support and understanding. What do I need to know about this condition? The main symptoms of depression are: Constant depressed or irritable mood. Loss of interest in things and activities that were enjoyed in the past. Other symptoms of depression include: Fatigue. Sleeping too much or too little. Difficulty falling asleep, or waking up early and not being able to get back to sleep. Difficulty concentrating or making decisions. Changes in appetite and weight. Staying away from others (isolating oneself). Expressing feelings of guilt. Expressing suicidal thoughts or feelings. What do I need to know about the treatment options? This condition is usually treated by mental health providers such as psychologists, psychiatrists, psychiatric nurse practitioners, and clinical social workers. Treatment may include one or more of the following: Psychotherapy, also called talk therapy or counseling. Types of psychotherapy include individual or group therapy, and they usually involve the following approaches: Cognitive behavioral therapy (CBT). This type of therapy teaches a person how to recognize feelings, thoughts, and behaviors that contribute to depression. The person is taught to make a choice about how to respond to these feelings, thoughts, and behaviors so that he or she can experience fewer symptoms. Interpersonal therapy (IPT). This helps to improve the way that someone with depression relates to and communicates with others. This type of therapy may involve caregivers, friends, and family members. Family therapy. This treatment helps family members  communicate and deal with conflict in healthy ways. Medicine. This is often used to help with certain emotions and behaviors. Combining medicine and therapy is often the most effective approach. The following lifestyle changes may also help with managing symptoms of depression: Limiting alcohol and drug use. Exercising regularly. Getting enough good-quality sleep. Making healthy eating choices. Reducing distressing situations. Spending time outside. Following regular daily routines. How can I support a person with depression? Talk about the condition Good communication is the key to supporting a person with depression. Here are a few things to keep in mind: Ask the person how you can support him or her. Respect the person's right to make decisions. Be careful about too much prodding. Try not to overdo reminders to an adult about things like taking medicines. Ask the person how he or she prefers that you help. Be encouraging and offer emotional support. This can help to lower stress. Even saying something simple to comfort the person may help. Listening is very important. Be available if the person wants to talk. Make an effort to acknowledge his or her feelings, and stay calm and realistic. Never ignore comments about suicide, and do not try to avoid the subject of suicide. Talking about suicide will not make the person want to act on it. You or the person with depression can reach out 24 hours a day to get free, private support (on the phone or a live online chat) from a suicide crisis helpline, such as the National Suicide Prevention Lifeline at 7207844153 or 988 in the [place] Find support and resources A health care provider may be able to recommend mental health resources that are available online or over the phone. You could start with: Government sites such as the Substance Abuse and Mental Health Services  Administration (SAMHSA): SkateOasis.com.pt SAMHSA'S Helpline: 1-800-662-HELP  (4357). National mental health organizations such as the [employer] Mental [employer]ness (NAMI): www.nami.org You may also consider: Joining self-help and support groups, not only for the person with depression, but also for yourself. People in these peer and family support groups understand what you and the person with depression are going through. They can help you feel a sense of hope and connect you with local resources to help you learn more. Attending family therapy with the person you are supporting. General support Make an effort to learn all you can about depression. Help the person with depression follow his or her treatment plan as directed by health care providers. This could mean driving him or her to therapy sessions or suggesting ways to manage stress. Ask the person if you may join him or her for a therapy session or go with him or her to health care visits. Joining the person with his or her permission can give you an opportunity to learn how to be more supportive. Include the person with depression in activities. Invite her or him to go for walks and outings. At first, she or he may not want to go, but keep trying. Be patient anddo not expect the person to do too much too soon. Help with daily responsibilities, such as laundry or meals. Sometimes daily tasks seem overwhelming to a person with depression. Remember that your support really matters. Social support is a huge benefit for someone who is coping with depression. How can I create a safe environment? If the person with depression feels unable to control his or her behavior, it may be necessary to take steps to keep his or her home safe. Such steps may include: Locking up alcohol and prescription pills that he or she may turn to. Count prescription pills often. You may want to consider removing alcohol from the home. Removing or locking up guns and other weapons. If you do not have a safe place to keep a gun, local law  enforcement may store a gun for you. Making a written crisis plan. Include important phone numbers, such as the local crisis intervention team. Make sure that: The person with depression knows about this plan. Everyone who has regular contact with this person knows about the plan and knows what to do in an emergency. How should I care for myself? It is important to find ways to care for your body, mind, and well-being while supporting someone with depression. Spend time with friends and family. Find someone you can talk to who will also help you work on using coping skills to manage stress. Consider seeking therapy for yourself. Try to maintain your normal routines. This can help you remember that your life is about more than the condition of the person with depression. Understand what your limits are. Say "no" to requests or events that lead to a schedule that is too busy. Make time for activities that help you relax, and try to not feel guilty about taking time for yourself. Consider trying meditation and deep breathing exercises to lower stress. Get plenty of sleep. Exercise, even if it is just taking a short walk a few times a week. What are some signs that the condition is getting worse? Signs that the person's condition may be getting worse include: Symptoms return or get worse. Not taking medicines or attending therapy as prescribed. Having more trouble sleeping or doing everyday activities. Withdrawal from friends and family. Get help right away  if the person you support: Expresses serious thoughts about self-harm or about hurting others. Sees, hears, tastes, smells, or feels things that are not present (hallucinations). If you ever feel like your loved one may hurt himself or herself or others, or if he or she shares thoughts about taking his or her own life, get help right away. You can go to your nearest emergency department or: Call your local emergency services (911 in the  [place] Call a suicide crisis helpline, such as the National Suicide Prevention Lifeline at 206-478-7757 or 988 in the [place] This is open 24 hours a day in the [place] Text the Crisis Text Line at 203-809-4405 (in the [place] Summary Depression is a mental health condition that affects the way a person feels, thinks, and handles daily activities. Depression is usually treated by mental health professionals. Treatment may include psychotherapy, medicine, lifestyle changes, or a combination of these approaches. When you support someone with depression, it is important to keep yourself healthy and safe. Get help right away if the person with depression expresses serious thoughts about self-harm. This information is not intended to replace advice given to you by your health care provider. Make sure you discuss any questions you have with your health care provider. Document Revised: 10/03/2020 Document Reviewed: 06/19/2020 Elsevier Patient Education  2024 [employer].

## 2022-08-15 NOTE — Progress Notes (Signed)
BP 101/69   Pulse 98   Temp 97.9 F (36.6 C) (Oral)   Ht 5' 3.86" (1.622 m)   Wt 151 lb 12.8 oz (68.9 kg)   SpO2 98%   BMI 26.17 kg/m    Subjective:    Patient ID: Brittany Page, female    DOB: 07/05/1990, 32 y.o.   MRN: [id number]  HPI: Brittany Page is a 32 y.o. female  Chief Complaint  Patient presents with   Vaginal Pain    Still having symptoms from last visit   Mood    Patient would like to restart her medications   VAGINAL DISCHARGE Was treated for BV on 07/28/22 with Flagyl.  She reports ongoing pain with urination and discomfort to vaginal area.  Has had recent unprotected sex with partner, has one partner. Duration: months Discharge description: white and milky Pruritus:  sometimes Dysuria: yes Malodorous: no Urinary frequency: no Fevers: no Abdominal pain: no  Sexual activity: monogamous History of sexually transmitted diseases: chlamydia, gonorrhea, trich Recent antibiotic use: no Context: ongoing and no better  Treatments attempted: none   DEPRESSION/ANXIETY Last was on Sertraline -- she reports this helped her mood and helped her sleep.  Everyone says she needs to go back on this.  Is a single mother and lots of anxiety with this. Mood status: uncontrolled Satisfied with current treatment?: yes Symptom severity: moderate  Duration of current treatment : chronic Side effects: no Medication compliance: good compliance Psychotherapy/counseling: none Previous psychiatric medications: Sertraline and Wellbutrin Depressed mood: yes Anxious mood: yes Anhedonia: yes Significant weight loss or gain: no Insomnia: yes hard to fall asleep Fatigue: yes Feelings of worthlessness or guilt: yes, sometimes Impaired concentration/indecisiveness: no Suicidal ideations: no -- no thoughts or plans, just wonders why she is here sometimes Hopelessness: yes Crying spells: yes    08/15/2022    4:13 PM 08/20/2021   11:48 AM 07/04/2021    4:01 PM 05/28/2021    1:39 PM  01/01/2021    4:21 PM  Depression screen PHQ 2/9  Decreased Interest 2 3 1  0 0  Down, Depressed, Hopeless 2 3 0 0 0  PHQ - 2 Score 4 6 1  0 0  Altered sleeping 2 3 0 0 0  Tired, decreased energy 2 3 0 0 0  Change in appetite 2 3 0 0 0  Feeling bad or failure about yourself  2 3 0 0 0  Trouble concentrating 1 0 0 0 0  Moving slowly or fidgety/restless 2 0 0 0 0  Suicidal thoughts 1 0 0 0 0  PHQ-9 Score 16 18 1  0 0  Difficult doing work/chores Somewhat difficult Somewhat difficult Not difficult at all Not difficult at all        08/15/2022    4:13 PM 08/20/2021   11:49 AM 07/04/2021    4:01 PM 05/28/2021    1:40 PM  GAD 7 : Generalized Anxiety Score  Nervous, Anxious, on Edge 3 0 1 0  Control/stop worrying 3 0 2 0  Worry too much - different things 3 0 2 0  Trouble relaxing 3 0 0 0  Restless 3 0 0 0  Easily annoyed or irritable 3 0 1 0  Afraid - awful might happen 3 0 0 0  Total GAD 7 Score 21 0 6 0  Anxiety Difficulty Somewhat difficult Not difficult at all Not difficult at all Not difficult at all     Relevant past medical, surgical, family and social history reviewed  and updated as indicated. Interim medical history since our last visit reviewed. Allergies and medications reviewed and updated.  Review of Systems  Constitutional:  Negative for activity change, appetite change, chills, fatigue and fever.  Respiratory:  Negative for cough, chest tightness, shortness of breath and wheezing.   Cardiovascular:  Negative for chest pain, palpitations and leg swelling.  Gastrointestinal:  Positive for abdominal pain. Negative for abdominal distention, nausea and vomiting.  Genitourinary:  Positive for dysuria, vaginal discharge and vaginal pain (some discomfort reported). Negative for decreased urine volume, frequency, hematuria, pelvic pain, urgency and vaginal bleeding.  Neurological: Negative.   Psychiatric/Behavioral: Negative.      Per HPI unless specifically indicated above      Objective:    BP 101/69   Pulse 98   Temp 97.9 F (36.6 C) (Oral)   Ht 5' 3.86" (1.622 m)   Wt 151 lb 12.8 oz (68.9 kg)   SpO2 98%   BMI 26.17 kg/m   Wt Readings from Last 3 Encounters:  08/15/22 151 lb 12.8 oz (68.9 kg)  07/28/22 156 lb 9.6 oz (71 kg)  02/07/22 157 lb 8 oz (71.4 kg)    Physical Exam Vitals and nursing note reviewed.  Constitutional:      General: She is awake. She is not in acute distress.    Appearance: She is well-developed and well-groomed. She is not ill-appearing or toxic-appearing.  HENT:     Head: Normocephalic.     Right Ear: Hearing and external ear normal.     Left Ear: Hearing and external ear normal.  Eyes:     General: Lids are normal.        Right eye: No discharge.        Left eye: No discharge.     Conjunctiva/sclera: Conjunctivae normal.     Pupils: Pupils are equal, round, and reactive to light.  Neck:     Vascular: No carotid bruit.  Cardiovascular:     Rate and Rhythm: Normal rate and regular rhythm.     Heart sounds: Normal heart sounds. No murmur heard.    No gallop.  Pulmonary:     Effort: Pulmonary effort is normal. No accessory muscle usage or respiratory distress.     Breath sounds: Normal breath sounds.  Abdominal:     General: Bowel sounds are normal. There is no distension.     Palpations: Abdomen is soft.     Tenderness: There is no abdominal tenderness. There is no right CVA tenderness or left CVA tenderness.  Musculoskeletal:     Cervical back: Normal range of motion and neck supple.     Right lower leg: No edema.     Left lower leg: No edema.  Skin:    General: Skin is warm and dry.  Neurological:     Mental Status: She is alert and oriented to person, place, and time.  Psychiatric:        Attention and Perception: Attention normal.        Mood and Affect: Mood normal.        Speech: Speech normal.        Behavior: Behavior normal. Behavior is cooperative.        Thought Content: Thought content normal.      Results for orders placed or performed in visit on 08/15/22  WET PREP FOR TRICH, YEAST, CLUE   Specimen: Sterile Swab   Sterile Swab  Result Value Ref Range   Trichomonas Exam Negative Negative  Yeast Exam Negative Negative   Clue Cell Exam Negative Negative      Assessment & Plan:   Problem List Items Addressed This Visit       Genitourinary   Bacterial vaginosis    Acute and improved. Wet prep negative today for clue cells.  Discussed at length with patient.      Relevant Orders   WET PREP FOR TRICH, YEAST, CLUE (Completed)     Other   Depression, recurrent (HCC) - Primary    Chronic with recent increased stressors, would like to restart medication.  Denies SI/HI.  Will restart Sertraline at 25 MG daily for one week and if tolerating can increase to 50 MG daily in one week.  Will continue to up titrate dependent on mood.  Unable to afford therapy, although would be beneficial. Plan on return in 4 weeks with her PCP.      Relevant Medications   sertraline (ZOLOFT) 25 MG tablet   Urinary pain    Ongoing urinary symptoms.  UA trace protein and blood today, no other abnormal.  Wet prep overall negative. Pregnancy testing negative.  Concern for STD, gonorrhea or chlamydia -- discussed with patient at length, will send urine for testing and if positive results treat as needed.  If negative then may benefit a visit with GYN for further assessment.      Relevant Orders   Urinalysis, Routine w reflex microscopic   Pregnancy, urine   GC/Chlamydia Probe Amp     Follow up plan: Return in about 4 weeks (around 09/12/2022) for Depression/anxiety with Dr. Shela Commons -- restarted Sertraline.

## 2022-08-15 NOTE — Assessment & Plan Note (Signed)
Acute and improved. Wet prep negative today for clue cells.  Discussed at length with patient.

## 2022-08-15 NOTE — Assessment & Plan Note (Signed)
Chronic with recent increased stressors, would like to restart medication.  Denies SI/HI.  Will restart Sertraline at 25 MG daily for one week and if tolerating can increase to 50 MG daily in one week.  Will continue to up titrate dependent on mood.  Unable to afford therapy, although would be beneficial. Plan on return in 4 weeks with her PCP.

## 2022-08-15 NOTE — Assessment & Plan Note (Signed)
Ongoing urinary symptoms.  UA trace protein and blood today, no other abnormal.  Wet prep overall negative. Pregnancy testing negative.  Concern for STD, gonorrhea or chlamydia -- discussed with patient at length, will send urine for testing and if positive results treat as needed.  If negative then may benefit a visit with GYN for further assessment.

## 2022-08-16 LAB — MICROSCOPIC EXAMINATION: Bacteria, UA: NONE SEEN

## 2022-08-16 LAB — URINALYSIS, ROUTINE W REFLEX MICROSCOPIC
Bilirubin, UA: NEGATIVE
Glucose, UA: NEGATIVE
Ketones, UA: NEGATIVE
Leukocytes,UA: NEGATIVE
Nitrite, UA: NEGATIVE
Specific Gravity, UA: >1.03 — ABNORMAL HIGH (ref 1.005–1.030)
Urobilinogen, Ur: 0.2 mg/dL (ref 0.2–1.0)
pH, UA: 5.5 (ref 5.0–7.5)

## 2022-08-16 LAB — PREGNANCY, URINE: Preg Test, Ur: NEGATIVE

## 2022-08-18 LAB — GC/CHLAMYDIA PROBE AMP
Chlamydia trachomatis, NAA: NEGATIVE
Neisseria Gonorrhoeae by PCR: NEGATIVE

## 2022-08-18 NOTE — Progress Notes (Signed)
Contacted via MyChart   Good news Brittany Page, your urine testing is negative.  If you continue to have symptoms I recommend we send you to GYN for further assessment and recommendations.

## 2022-09-06 ENCOUNTER — Other Ambulatory Visit: Payer: Self-pay | Admitting: Nurse Practitioner

## 2022-09-08 NOTE — Telephone Encounter (Signed)
Requested Prescriptions  Refused Prescriptions Disp Refills   sertraline (ZOLOFT) 25 MG tablet [Pharmacy Med Name: SERTRALINE HCL 25 MG TABLET] 180 tablet 1    Sig: START OUT TAKING ONE TABLET (25 MG) BY MOUTH ONCE DAILY FOR ONE WEEK AND THEN MAY INCREASE TO 50 MG (2 TABLETS) BY MOUTH DAILY IF TOLERATING.     Psychiatry:  Antidepressants - SSRI - sertraline Failed - 09/06/2022  2:31 PM      Failed - AST in normal range and within 360 days    AST  Date Value Ref Range Status  09/28/2020 18 0 - 40 IU/L Final         Failed - ALT in normal range and within 360 days    ALT  Date Value Ref Range Status  09/28/2020 16 0 - 32 IU/L Final         Passed - Completed PHQ-2 or PHQ-9 in the last 360 days      Passed - Valid encounter within last 6 months    Recent Outpatient Visits           3 weeks ago Depression, recurrent (HCC)   [place] [facility] [place], [place] T, NP   1 month ago Urinary symptom or sign   [place] [facility] [place], Corrie Dandy T, NP   7 months ago Vaginal discharge   [place] [facility] [place], Oswaldo Conroy, PA-C   1 year ago Bartholin's cyst   [place] [facility] [place], Corrie Dandy T, NP   1 year ago Depression, recurrent [facility])   [place] [facility] Dorcas Carrow, DO       Future Appointments             In 4 days Dorcas Carrow, DO [place] [facility]

## 2022-09-12 ENCOUNTER — Ambulatory Visit: Payer: Medicaid Other | Admitting: Family Medicine

## 2023-04-10 ENCOUNTER — Ambulatory Visit: Payer: Managed Care, Other (non HMO) | Admitting: Family Medicine

## 2023-04-16 ENCOUNTER — Ambulatory Visit: Payer: Medicaid Other | Admitting: Family Medicine

## 2023-04-21 ENCOUNTER — Ambulatory Visit: Payer: Managed Care, Other (non HMO) | Admitting: Family Medicine

## 2023-06-07 NOTE — Patient Instructions (Signed)
 Vaginal Infection (Bacterial Vaginosis): What to Know  Bacterial vaginosis is an infection of the vagina. It happens when the balance of normal germs (bacteria) in the vagina changes. If you don't get treated, it can make it easier for you to get other infections from sex. These are called sexually transmitted infections (STIs). If you're pregnant, you need to get treated right away. This infection can cause a baby to be born early or at a low birth weight. What are the causes? This infection happens when too many harmful germs grow in the vagina. You can't get this infection from toilet seats, bedsheets, swimming pools, or things that touch your vagina. What increases the risk? Having sex with a new person or more than one person. Having sex without protection. Douching. Having an intrauterine device (IUD). Smoking. Using drugs or drinking alcohol. These can lead you to do risky things. Taking certain antibiotics. Being pregnant. What are the signs or symptoms? Some females have no symptoms. Symptoms may include: A gray or white discharge from your vagina. It can be watery or foamy. A fishy smell. This can happen after sex or during your menstrual period. Itching in and around your vagina. Burning or pain when you pee. How is this treated? This infection is treated with antibiotics. These may be given to you as: A pill. A cream for your vagina. A medicine that you put into your vagina (suppository). If the infection comes back, you may need more antibiotics. Follow these instructions at home: Medicines Take your medicines as told. Take or use your antibiotics as told. Do not stop using them even if you start to feel better. General instructions If the person you have sex with is a female, tell her that you have this infection. She will need to follow up with her doctor. Female partners don't need to be treated. Do not have sex until you finish treatment. Drink more fluids as  told. Keep your vagina and butt clean. Wash these areas with warm water each day. Wipe from front to back after you poop. If you're breastfeeding a baby, talk to your doctor if you should keep doing so during treatment. How is this prevented? Self-care Do not douche. Do not use deodorant sprays on your vagina. Wear cotton underwear. Do not wear tight pants and pantyhose, especially in the summer. Safe sex Use condoms the correct way and every time you have sex. Use dental dams to protect yourself during oral sex. Limit how many people you have sex with. Get tested for STIs. The person you have sex with should also get tested. Drugs and alcohol Do not smoke, vape, or use nicotine or tobacco. Do not use drugs. Limit the amount of alcohol you drink because it can lead you to do risky things. Where to find more information To learn more: Go to TonerPromos.no. Click Health Topics A-Z. Type "bacterial vaginosis" in the search bar. American Sexual Health Association (ASHA): ashasexualhealth.org U.S. Department of Health and [employer], Office on Women's Health: TravelLesson.ca Contact a doctor if: Your symptoms don't get better, even after treatment. You have more discharge or pain when you pee. You have a fever or chills. You have pain in your belly or in the area between your hips. You have pain during sex. You bleed from your vagina between menstrual periods. This information is not intended to replace advice given to you by your health care provider. Make sure you discuss any questions you have with your health care provider. Document  Revised: 08/27/2022 Document Reviewed: 08/27/2022 Elsevier Patient Education  2024 [employer].

## 2023-06-09 ENCOUNTER — Encounter: Payer: Self-pay | Admitting: Nurse Practitioner

## 2023-06-09 ENCOUNTER — Ambulatory Visit: Admitting: Nurse Practitioner

## 2023-06-09 VITALS — BP 113/74 | HR 86 | Temp 98.1°F | Ht 63.8 in | Wt 181.0 lb

## 2023-06-09 DIAGNOSIS — N760 Acute vaginitis: Secondary | ICD-10-CM

## 2023-06-09 DIAGNOSIS — B9689 Other specified bacterial agents as the cause of diseases classified elsewhere: Secondary | ICD-10-CM | POA: Diagnosis not present

## 2023-06-09 DIAGNOSIS — R8281 Pyuria: Secondary | ICD-10-CM

## 2023-06-09 DIAGNOSIS — N898 Other specified noninflammatory disorders of vagina: Secondary | ICD-10-CM

## 2023-06-09 LAB — URINALYSIS, ROUTINE W REFLEX MICROSCOPIC
Bilirubin, UA: NEGATIVE
Glucose, UA: NEGATIVE
Ketones, UA: NEGATIVE
Leukocytes,UA: NEGATIVE
Nitrite, UA: NEGATIVE
Specific Gravity, UA: >1.03 — ABNORMAL HIGH (ref 1.005–1.030)
Urobilinogen, Ur: 0.2 mg/dL (ref 0.2–1.0)
pH, UA: 5.5 (ref 5.0–7.5)

## 2023-06-09 LAB — MICROSCOPIC EXAMINATION: RBC, Urine: >30 /HPF — ABNORMAL HIGH (ref 0–2)

## 2023-06-09 LAB — WET PREP FOR TRICH, YEAST, CLUE
Clue Cell Exam: POSITIVE — AB
Trichomonas Exam: NEGATIVE
Yeast Exam: NEGATIVE

## 2023-06-09 MED ORDER — METRONIDAZOLE 0.75 % VA GEL: 1.0000 | Freq: Two times a day (BID) | VAGINAL | 0 refills | Status: DC

## 2023-06-09 MED ORDER — METRONIDAZOLE 0.75 % VA GEL: 1.0000 | Freq: Every day | VAGINAL | 0 refills | Status: DC

## 2023-06-09 NOTE — Progress Notes (Signed)
 BP 113/74   Pulse 86   Temp 98.1 F (36.7 C) (Oral)   Ht 5' 3.8" (1.621 m)   Wt 181 lb (82.1 kg)   LMP 05/16/2023 (Approximate)   SpO2 98%   BMI 31.26 kg/m    Subjective:    Patient ID: Brittany Page, female    DOB: 05-Sep-1990, 33 y.o.   MRN: [id number]  HPI: Brittany Page is a 33 y.o. female  Chief Complaint  Patient presents with   Vaginal Odor    Patient states she has been noticing a vaginal odor for the last few weeks. States she also gets the odor before starting her cycle but wants to be sure it is not BV.    VAGINAL DISCHARGE Started to have odor three weeks ago.   Duration: days Discharge description: mucous  Pruritus: no Dysuria: no Malodorous: yes Urinary frequency: no Fevers: no Abdominal pain: no  Sexual activity: monogamous History of sexually transmitted diseases: no Recent antibiotic use: no Context:has had BV before  Treatments attempted: none   Relevant past medical, surgical, family and social history reviewed and updated as indicated. Interim medical history since our last visit reviewed. Allergies and medications reviewed and updated.  Review of Systems  Constitutional:  Negative for activity change, appetite change, diaphoresis, fatigue and unexpected weight change.  Respiratory:  Negative for cough, chest tightness, shortness of breath and wheezing.   Cardiovascular:  Negative for chest pain, palpitations and leg swelling.  Gastrointestinal: Negative.   Neurological: Negative.   Psychiatric/Behavioral: Negative.     Per HPI unless specifically indicated above     Objective:    BP 113/74   Pulse 86   Temp 98.1 F (36.7 C) (Oral)   Ht 5' 3.8" (1.621 m)   Wt 181 lb (82.1 kg)   LMP 05/16/2023 (Approximate)   SpO2 98%   BMI 31.26 kg/m   Wt Readings from Last 3 Encounters:  06/09/23 181 lb (82.1 kg)  08/15/22 151 lb 12.8 oz (68.9 kg)  07/28/22 156 lb 9.6 oz (71 kg)    Physical Exam Vitals and nursing note reviewed.   Constitutional:      General: She is awake. She is not in acute distress.    Appearance: She is well-developed and well-groomed. She is not ill-appearing or toxic-appearing.  HENT:     Head: Normocephalic.     Right Ear: Hearing and external ear normal.     Left Ear: Hearing and external ear normal.  Eyes:     General: Lids are normal.        Right eye: No discharge.        Left eye: No discharge.     Conjunctiva/sclera: Conjunctivae normal.     Pupils: Pupils are equal, round, and reactive to light.  Neck:     Thyroid: No thyromegaly.     Vascular: No carotid bruit.  Cardiovascular:     Rate and Rhythm: Normal rate and regular rhythm.     Heart sounds: Normal heart sounds. No murmur heard.    No gallop.  Pulmonary:     Effort: Pulmonary effort is normal. No accessory muscle usage or respiratory distress.     Breath sounds: Normal breath sounds.  Abdominal:     General: Bowel sounds are normal. There is no distension.     Palpations: Abdomen is soft.     Tenderness: There is no abdominal tenderness.  Musculoskeletal:     Cervical back: Normal range of motion and neck supple.  Right lower leg: No edema.     Left lower leg: No edema.  Lymphadenopathy:     Cervical: No cervical adenopathy.  Skin:    General: Skin is warm and dry.  Neurological:     Mental Status: She is alert and oriented to person, place, and time.     Deep Tendon Reflexes: Reflexes are normal and symmetric.     Reflex Scores:      Brachioradialis reflexes are 2+ on the right side and 2+ on the left side.      Patellar reflexes are 2+ on the right side and 2+ on the left side. Psychiatric:        Attention and Perception: Attention normal.        Mood and Affect: Mood normal.        Speech: Speech normal.        Behavior: Behavior normal. Behavior is cooperative.        Thought Content: Thought content normal.    Results for orders placed or performed in visit on 06/09/23  WET PREP FOR TRICH,  YEAST, CLUE   Collection Time: 06/09/23  4:12 PM   Specimen: Sterile Swab   Sterile Swab  Result Value Ref Range   Trichomonas Exam Negative Negative   Yeast Exam Negative Negative   Clue Cell Exam Positive (A) Negative  Microscopic Examination   Collection Time: 06/09/23  4:12 PM   Sterile Swab  Result Value Ref Range   WBC, UA 0-5 0 - 5 /hpf   RBC, Urine >30 (H) 0 - 2 /hpf   Epithelial Cells (non renal) 0-10 0 - 10 /hpf   Mucus, UA Present (A) Not Estab.   Bacteria, UA Moderate (A) None seen/Few  Urinalysis, Routine w reflex microscopic   Collection Time: 06/09/23  4:12 PM  Result Value Ref Range   Specific Gravity, UA >1.030 (H) 1.005 - 1.030   pH, UA 5.5 5.0 - 7.5   Color, UA Brown (A) Yellow   Appearance Ur Cloudy (A) Clear   Leukocytes,UA Negative Negative   Protein,UA 1+ (A) Negative/Trace   Glucose, UA Negative Negative   Ketones, UA Negative Negative   RBC, UA 3+ (A) Negative   Bilirubin, UA Negative Negative   Urobilinogen, Ur 0.2 0.2 - 1.0 mg/dL   Nitrite, UA Negative Negative   Microscopic Examination See below:       Assessment & Plan:   Problem List Items Addressed This Visit       Genitourinary   Bacterial vaginosis   Symptoms present for 3 weeks.  History of BV.  UA 3+, PRT 1+, >30, Mod bacteria.  Wet prep + clue cells, neg trich and yeast.  Will start Metrogel vaginal every night for 7 days, extend as needed.  Has had 6 months treatment in past due to recurrence.  Return to office if ongoing or worsening.      Other Visit Diagnoses       Vaginal discharge    -  Primary   Relevant Orders   WET PREP FOR TRICH, YEAST, CLUE (Completed)   Urinalysis, Routine w reflex microscopic (Completed)        Follow up plan: Return if symptoms worsen or fail to improve.

## 2023-06-09 NOTE — Assessment & Plan Note (Signed)
 Symptoms present for 3 weeks.  History of BV.  UA 3+, PRT 1+, >30, Mod bacteria.  Wet prep + clue cells, neg trich and yeast.  Will start Metrogel vaginal every night for 7 days, extend as needed.  Has had 6 months treatment in past due to recurrence.  Return to office if ongoing or worsening.

## 2023-06-10 NOTE — Addendum Note (Signed)
 Addended by: Aura Dials T on: 06/10/2023 09:10 AM   Modules accepted: Orders

## 2023-06-12 LAB — URINE CULTURE

## 2023-08-21 ENCOUNTER — Ambulatory Visit: Admitting: Family Medicine

## 2023-08-21 ENCOUNTER — Encounter: Payer: Self-pay | Admitting: Family Medicine

## 2023-08-21 ENCOUNTER — Ambulatory Visit: Payer: Self-pay

## 2023-08-21 VITALS — BP 126/91 | HR 76 | Ht 64.0 in | Wt 181.0 lb

## 2023-08-21 DIAGNOSIS — B9689 Other specified bacterial agents as the cause of diseases classified elsewhere: Secondary | ICD-10-CM

## 2023-08-21 DIAGNOSIS — N760 Acute vaginitis: Secondary | ICD-10-CM

## 2023-08-21 DIAGNOSIS — N898 Other specified noninflammatory disorders of vagina: Secondary | ICD-10-CM

## 2023-08-21 LAB — URINALYSIS, ROUTINE W REFLEX MICROSCOPIC
Bilirubin, UA: NEGATIVE
Glucose, UA: NEGATIVE
Ketones, UA: NEGATIVE
Nitrite, UA: NEGATIVE
Protein,UA: NEGATIVE
RBC, UA: NEGATIVE
Specific Gravity, UA: 1.025 (ref 1.005–1.030)
Urobilinogen, Ur: 0.2 mg/dL (ref 0.2–1.0)
pH, UA: 7 (ref 5.0–7.5)

## 2023-08-21 LAB — MICROSCOPIC EXAMINATION
Bacteria, UA: NONE SEEN
RBC, Urine: NONE SEEN /HPF (ref 0–2)

## 2023-08-21 LAB — WET PREP FOR TRICH, YEAST, CLUE
Clue Cell Exam: POSITIVE — AB
Trichomonas Exam: NEGATIVE
Yeast Exam: NEGATIVE

## 2023-08-21 MED ORDER — METRONIDAZOLE 0.75 % VA GEL: 1.0000 | Freq: Every day | VAGINAL | 0 refills | Status: AC

## 2023-08-21 NOTE — Assessment & Plan Note (Signed)
 Will treat with flagyl  gel. Call with any concerns. Continue to monitor

## 2023-08-21 NOTE — Telephone Encounter (Signed)
 Patient just left office was prescribed Metrogel , she is at [facility] and they script has not been received. This Clinical research associate called to [facility] they confirm receipt and is ready pick up. Patient is aware prescription is ready for pick up.   Reason for Disposition . [1] Prescription not at pharmacy AND [2] was prescribed by PCP recently  (Exception: [facility] has access to EMR and prescription is recorded there. Go to Home Care and confirm for pharmacy.)  Protocols used: Medication Refill and Renewal Call-A-AH

## 2023-08-21 NOTE — Progress Notes (Signed)
 BP (!) 126/91 (BP Location: Left Arm, Patient Position: Sitting)   Pulse 76   Ht 5\' 4"  (1.626 m)   Wt 181 lb (82.1 kg)   LMP 08/16/2023 (Approximate)   SpO2 96%   BMI 31.07 kg/m    Subjective:    Patient ID: Brittany Page, female    DOB: 1990/08/30, 33 y.o.   MRN: [id number]  HPI: Brittany Page is a 33 y.o. female  Chief Complaint  Patient presents with   Vaginitis    Discharge, odor, denies dysuria or pain   VAGINAL DISCHARGE Duration: few days Discharge description: white  Pruritus: yes Dysuria: no Malodorous: yes Urinary frequency: no Fevers: no Abdominal pain: yes  Sexual activity: practicing safe sex History of sexually transmitted diseases: no Recent antibiotic use: no Context: recurrent BV  Treatments attempted: probiotics, antifungal, and vagisil  Relevant past medical, surgical, family and social history reviewed and updated as indicated. Interim medical history since our last visit reviewed. Allergies and medications reviewed and updated.  Review of Systems  Constitutional: Negative.   Respiratory: Negative.    Cardiovascular: Negative.   Genitourinary:  Positive for vaginal discharge. Negative for decreased urine volume, difficulty urinating, dyspareunia, dysuria, enuresis, flank pain, frequency, genital sores, hematuria, menstrual problem, pelvic pain, urgency, vaginal bleeding and vaginal pain.  Musculoskeletal: Negative.   Neurological: Negative.   Psychiatric/Behavioral: Negative.      Per HPI unless specifically indicated above     Objective:     BP (!) 126/91 (BP Location: Left Arm, Patient Position: Sitting)   Pulse 76   Ht 5\' 4"  (1.626 m)   Wt 181 lb (82.1 kg)   LMP 08/16/2023 (Approximate)   SpO2 96%   BMI 31.07 kg/m   Wt Readings from Last 3 Encounters:  08/21/23 181 lb (82.1 kg)  06/09/23 181 lb (82.1 kg)  08/15/22 151 lb 12.8 oz (68.9 kg)    Physical Exam Vitals and nursing note reviewed.  Constitutional:      General: She  is not in acute distress.    Appearance: Normal appearance. She is not ill-appearing, toxic-appearing or diaphoretic.  HENT:     Head: Normocephalic and atraumatic.     Right Ear: External ear normal.     Left Ear: External ear normal.     Nose: Nose normal.     Mouth/Throat:     Mouth: Mucous membranes are moist.     Pharynx: Oropharynx is clear.  Eyes:     General: No scleral icterus.       Right eye: No discharge.        Left eye: No discharge.     Extraocular Movements: Extraocular movements intact.     Conjunctiva/sclera: Conjunctivae normal.     Pupils: Pupils are equal, round, and reactive to light.  Cardiovascular:     Rate and Rhythm: Normal rate and regular rhythm.     Pulses: Normal pulses.     Heart sounds: Normal heart sounds. No murmur heard.    No friction rub. No gallop.  Pulmonary:     Effort: Pulmonary effort is normal. No respiratory distress.     Breath sounds: Normal breath sounds. No stridor. No wheezing, rhonchi or rales.  Chest:     Chest wall: No tenderness.  Musculoskeletal:        General: Normal range of motion.     Cervical back: Normal range of motion and neck supple.  Skin:    General: Skin is warm and dry.  Capillary Refill: Capillary refill takes less than 2 seconds.     Coloration: Skin is not jaundiced or pale.     Findings: No bruising, erythema, lesion or rash.  Neurological:     General: No focal deficit present.     Mental Status: She is alert and oriented to person, place, and time. Mental status is at baseline.  Psychiatric:        Mood and Affect: Mood normal.        Behavior: Behavior normal.        Thought Content: Thought content normal.        Judgment: Judgment normal.     Results for orders placed or performed in visit on 08/21/23  WET PREP FOR TRICH, YEAST, CLUE   Collection Time: 08/21/23  4:02 PM   Specimen: Urine   Urine  Result Value Ref Range   Trichomonas Exam Negative Negative   Yeast Exam Negative  Negative   Clue Cell Exam Positive (A) Negative  Microscopic Examination   Collection Time: 08/21/23  4:02 PM   Urine  Result Value Ref Range   WBC, UA 0-5 0 - 5 /hpf   RBC, Urine None seen 0 - 2 /hpf   Epithelial Cells (non renal) 0-10 0 - 10 /hpf   Bacteria, UA None seen None seen/Few  Urinalysis, Routine w reflex microscopic   Collection Time: 08/21/23  4:02 PM  Result Value Ref Range   Specific Gravity, UA 1.025 1.005 - 1.030   pH, UA 7.0 5.0 - 7.5   Color, UA Yellow Yellow   Appearance Ur Clear Clear   Leukocytes,UA Trace (A) Negative   Protein,UA Negative Negative/Trace   Glucose, UA Negative Negative   Ketones, UA Negative Negative   RBC, UA Negative Negative   Bilirubin, UA Negative Negative   Urobilinogen, Ur 0.2 0.2 - 1.0 mg/dL   Nitrite, UA Negative Negative   Microscopic Examination See below:       Assessment & Plan:   Problem List Items Addressed This Visit       Genitourinary   Bacterial vaginosis - Primary   Will treat with flagyl  gel. Call with any concerns. Continue to monitor       Other Visit Diagnoses       Vaginal discharge       + clue cells   Relevant Orders   WET PREP FOR TRICH, YEAST, CLUE (Completed)   Urinalysis, Routine w reflex microscopic (Completed)        Follow up plan: Return in about 4 weeks (around 09/18/2023), or 4-8 weeks, for physical.

## 2023-08-24 NOTE — Telephone Encounter (Signed)
 No action needed.

## 2023-10-06 ENCOUNTER — Encounter: Payer: Self-pay | Admitting: Family Medicine

## 2023-10-06 ENCOUNTER — Ambulatory Visit (INDEPENDENT_AMBULATORY_CARE_PROVIDER_SITE_OTHER): Admitting: Family Medicine

## 2023-10-06 VITALS — BP 121/82 | HR 86 | Temp 98.2°F | Ht 64.8 in | Wt 188.8 lb

## 2023-10-06 DIAGNOSIS — H6121 Impacted cerumen, right ear: Secondary | ICD-10-CM

## 2023-10-06 DIAGNOSIS — Z0000 Encounter for general adult medical examination without abnormal findings: Secondary | ICD-10-CM

## 2023-10-06 NOTE — Progress Notes (Signed)
 BP 121/82   Pulse 86   Temp 98.2 F (36.8 C) (Oral)   Ht 5' 4.8 (1.646 m)   Wt 188 lb 12.8 oz (85.6 kg)   LMP 09/08/2023 (Approximate)   SpO2 96%   BMI 31.61 kg/m    Subjective:    Patient ID: Brittany Page, female    DOB: 1990-09-23, 33 y.o.   MRN: [id number]  HPI: Brittany Page is a 33 y.o. female presenting on 10/06/2023 for comprehensive medical examination. Current medical complaints include:  Has been having issues with her back. She is having trouble lifting. Has been working with [employer], but wants a 2nd opinion.  She currently lives with: son Menopausal Symptoms: no  Depression Screen done today and results listed below:     10/06/2023    4:00 PM 08/21/2023    4:20 PM 08/15/2022    4:13 PM 08/20/2021   11:48 AM 07/04/2021    4:01 PM  Depression screen PHQ 2/9  Decreased Interest 0 0 2 3 1   Down, Depressed, Hopeless 0 0 2 3 0  PHQ - 2 Score 0 0 4 6 1   Altered sleeping 0 0 2 3 0  Tired, decreased energy 1 1 2 3  0  Change in appetite 1 1 2 3  0  Feeling bad or failure about yourself  0 0 2 3 0  Trouble concentrating 0 0 1 0 0  Moving slowly or fidgety/restless 0 0 2 0 0  Suicidal thoughts 0 0 1 0 0  PHQ-9 Score 2 2 16 18 1   Difficult doing work/chores Not difficult at all  Somewhat difficult Somewhat difficult Not difficult at all    Past Medical History:  Past Medical History:  Diagnosis Date   Anemia 01/15/2015   Low hemoglobin    Miscarriage 04/2014   lost at 8 month gestation (still born)    Surgical History:  Past Surgical History:  Procedure Laterality Date   WISDOM TOOTH EXTRACTION     tooth in nasal cavity    Medications:  No current outpatient medications on file prior to visit.   No current facility-administered medications on file prior to visit.    Allergies:  No Known Allergies  Social History:  Social History   Socioeconomic History   Marital status: Single    Spouse name: Not on file   Number of children: Not on file    Years of education: Not on file   Highest education level: Associate degree: occupational, Scientist, product/process development, or vocational program  Occupational History   Not on file  Tobacco Use   Smoking status: Never   Smokeless tobacco: Never  Vaping Use   Vaping status: Never Used  Substance and Sexual Activity   Alcohol use: Yes    Alcohol/week: 0.0 standard drinks of alcohol    Comment: occas   Drug use: No   Sexual activity: Yes    Partners: Male    Birth control/protection: None  Other Topics Concern   Not on file  Social History Narrative   Not on file   Social Drivers of Health   Financial Resource Strain: Medium Risk (10/06/2023)   Overall Financial Resource Strain (CARDIA)    Difficulty of Paying Living Expenses: Somewhat hard  Food Insecurity: Food Insecurity Present (10/06/2023)   Hunger Vital Sign    Worried About Running Out of Food in the Last Year: Sometimes true    Ran Out of Food in the Last Year: Sometimes true  Transportation Needs: No Transportation  Needs (10/06/2023)   PRAPARE - Administrator, Civil Service (Medical): No    Lack of Transportation (Non-Medical): No  Physical Activity: Inactive (10/06/2023)   Exercise Vital Sign    Days of Exercise per Week: 0 days    Minutes of Exercise per Session: 0 min  Stress: No Stress Concern Present (10/06/2023)   [employer] of Occupational Health - Occupational Stress Questionnaire    Feeling of Stress: Not at all  Social Connections: Moderately Isolated (10/06/2023)   Social Connection and Isolation Panel    Frequency of Communication with Friends and Family: More than three times a week    Frequency of Social Gatherings with Friends and Family: Once a week    Attends Religious Services: More than 4 times per year    Active Member of [employer] or Organizations: No    Attends Banker Meetings: Never    Marital Status: Never married  Intimate Partner Violence: Not At Risk (10/06/2023)   Humiliation,  Afraid, Rape, and Kick questionnaire    Fear of Current or Ex-Partner: No    Emotionally Abused: No    Physically Abused: No    Sexually Abused: No   Social History   Tobacco Use  Smoking Status Never  Smokeless Tobacco Never   Social History   Substance and Sexual Activity  Alcohol Use Yes   Alcohol/week: 0.0 standard drinks of alcohol   Comment: occas    Family History:  Family History  Problem Relation Age of Onset   Healthy Mother    Hypertension Father    Migraines Father    Sleep apnea Father        sinus problems   Hyperlipidemia Paternal Aunt    Hyperlipidemia Paternal Uncle    Cancer Maternal Grandmother        cervical cancer   Hepatitis B Maternal Grandfather    Pancreatic cancer Maternal Grandfather    Hyperlipidemia Paternal Grandmother    Hyperlipidemia Paternal Grandfather    Heart attack Paternal Grandfather     Past medical history, surgical history, medications, allergies, family history and social history reviewed with patient today and changes made to appropriate areas of the chart.   Review of Systems  Constitutional: Negative.   HENT: Negative.    Eyes: Negative.   Respiratory: Negative.    Cardiovascular:  Positive for chest pain (1x a work, short lived). Negative for palpitations, orthopnea, claudication, leg swelling and PND.  Gastrointestinal: Negative.   Genitourinary: Negative.   Musculoskeletal:  Positive for back pain. Negative for falls, joint pain, myalgias and neck pain.  Skin: Negative.   Neurological: Negative.   Endo/Heme/Allergies: Negative.   Psychiatric/Behavioral: Negative.     All other ROS negative except what is listed above and in the HPI.      Objective:    BP 121/82   Pulse 86   Temp 98.2 F (36.8 C) (Oral)   Ht 5' 4.8 (1.646 m)   Wt 188 lb 12.8 oz (85.6 kg)   LMP 09/08/2023 (Approximate)   SpO2 96%   BMI 31.61 kg/m   Wt Readings from Last 3 Encounters:  10/06/23 188 lb 12.8 oz (85.6 kg)  08/21/23  181 lb (82.1 kg)  06/09/23 181 lb (82.1 kg)    Physical Exam Vitals and nursing note reviewed.  Constitutional:      General: She is not in acute distress.    Appearance: Normal appearance. She is not ill-appearing, toxic-appearing or diaphoretic.  HENT:  Head: Normocephalic and atraumatic.     Right Ear: Ear canal and external ear normal. There is impacted cerumen.     Left Ear: Tympanic membrane, ear canal and external ear normal. There is no impacted cerumen.     Nose: Nose normal. No congestion or rhinorrhea.     Mouth/Throat:     Mouth: Mucous membranes are moist.     Pharynx: Oropharynx is clear. No oropharyngeal exudate or posterior oropharyngeal erythema.  Eyes:     General: No scleral icterus.       Right eye: No discharge.        Left eye: No discharge.     Extraocular Movements: Extraocular movements intact.     Conjunctiva/sclera: Conjunctivae normal.     Pupils: Pupils are equal, round, and reactive to light.  Neck:     Vascular: No carotid bruit.  Cardiovascular:     Rate and Rhythm: Normal rate and regular rhythm.     Pulses: Normal pulses.     Heart sounds: No murmur heard.    No friction rub. No gallop.  Pulmonary:     Effort: Pulmonary effort is normal. No respiratory distress.     Breath sounds: Normal breath sounds. No stridor. No wheezing, rhonchi or rales.  Chest:     Chest wall: No tenderness.  Abdominal:     General: Abdomen is flat. Bowel sounds are normal. There is no distension.     Palpations: Abdomen is soft. There is no mass.     Tenderness: There is no abdominal tenderness. There is no right CVA tenderness, left CVA tenderness, guarding or rebound.     Hernia: No hernia is present.  Genitourinary:    Comments: Breast and pelvic exams deferred with shared decision making Musculoskeletal:        General: No swelling, tenderness, deformity or signs of injury.     Cervical back: Normal range of motion and neck supple. No rigidity. No muscular  tenderness.     Right lower leg: No edema.     Left lower leg: No edema.  Lymphadenopathy:     Cervical: No cervical adenopathy.  Skin:    General: Skin is warm and dry.     Capillary Refill: Capillary refill takes less than 2 seconds.     Coloration: Skin is not jaundiced or pale.     Findings: No bruising, erythema, lesion or rash.  Neurological:     General: No focal deficit present.     Mental Status: She is alert and oriented to person, place, and time. Mental status is at baseline.     Cranial Nerves: No cranial nerve deficit.     Sensory: No sensory deficit.     Motor: No weakness.     Coordination: Coordination normal.     Gait: Gait normal.     Deep Tendon Reflexes: Reflexes normal.  Psychiatric:        Mood and Affect: Mood normal.        Behavior: Behavior normal.        Thought Content: Thought content normal.        Judgment: Judgment normal.     Results for orders placed or performed in visit on 08/21/23  WET PREP FOR TRICH, YEAST, CLUE   Collection Time: 08/21/23  4:02 PM   Specimen: Urine   Urine  Result Value Ref Range   Trichomonas Exam Negative Negative   Yeast Exam Negative Negative   Clue Cell Exam Positive (A) Negative  Microscopic Examination   Collection Time: 08/21/23  4:02 PM   Urine  Result Value Ref Range   WBC, UA 0-5 0 - 5 /hpf   RBC, Urine None seen 0 - 2 /hpf   Epithelial Cells (non renal) 0-10 0 - 10 /hpf   Bacteria, UA None seen None seen/Few  Urinalysis, Routine w reflex microscopic   Collection Time: 08/21/23  4:02 PM  Result Value Ref Range   Specific Gravity, UA 1.025 1.005 - 1.030   pH, UA 7.0 5.0 - 7.5   Color, UA Yellow Yellow   Appearance Ur Clear Clear   Leukocytes,UA Trace (A) Negative   Protein,UA Negative Negative/Trace   Glucose, UA Negative Negative   Ketones, UA Negative Negative   RBC, UA Negative Negative   Bilirubin, UA Negative Negative   Urobilinogen, Ur 0.2 0.2 - 1.0 mg/dL   Nitrite, UA Negative Negative    Microscopic Examination See below:       Assessment & Plan:   Problem List Items Addressed This Visit   None Visit Diagnoses       Routine general medical examination at a health care facility    -  Primary   Vaccines up to date. Screening labs checked today. Pap up to date. Continue diet and exercise. Call with any concerns.   Relevant Orders   CBC with Differential/Platelet   Comprehensive metabolic panel with GFR   Lipid Panel w/o Chol/HDL Ratio   TSH     Impacted cerumen of right ear       Flushed today with good results.        Follow up plan: Return in about 1 year (around 10/05/2024) for physical.   LABORATORY TESTING:  - Pap smear: up to date  IMMUNIZATIONS:   - Tdap: Tetanus vaccination status reviewed: last tetanus booster within 10 years. - Influenza: Postponed to flu season - Pneumovax: Not applicable - Prevnar: Not applicable - COVID: Refused - HPV: Up to date  PATIENT COUNSELING:   Advised to take 1 mg of folate supplement per day if capable of pregnancy.   Sexuality: Discussed sexually transmitted diseases, partner selection, use of condoms, avoidance of unintended pregnancy  and contraceptive alternatives.   Advised to avoid cigarette smoking.  I discussed with the patient that most people either abstain from alcohol or drink within safe limits (<=14/week and <=4 drinks/occasion for males, <=7/weeks and <= 3 drinks/occasion for females) and that the risk for alcohol disorders and other health effects rises proportionally with the number of drinks per week and how often a drinker exceeds daily limits.  Discussed cessation/primary prevention of drug use and availability of treatment for abuse.   Diet: Encouraged to adjust caloric intake to maintain  or achieve ideal body weight, to reduce intake of dietary saturated fat and total fat, to limit sodium intake by avoiding high sodium foods and not adding table salt, and to maintain adequate dietary  potassium and calcium preferably from fresh fruits, vegetables, and low-fat dairy products.    stressed the importance of regular exercise  Injury prevention: Discussed safety belts, safety helmets, smoke detector, smoking near bedding or upholstery.   Dental health: Discussed importance of regular tooth brushing, flossing, and dental visits.    NEXT PREVENTATIVE PHYSICAL DUE IN 1 YEAR. Return in about 1 year (around 10/05/2024) for physical.

## 2023-10-07 LAB — COMPREHENSIVE METABOLIC PANEL WITH GFR
ALT: 21 IU/L (ref 0–32)
AST: 20 IU/L (ref 0–40)
Albumin: 4.2 g/dL (ref 3.9–4.9)
Alkaline Phosphatase: 90 IU/L (ref 44–121)
BUN/Creatinine Ratio: 17 (ref 9–23)
BUN: 14 mg/dL (ref 6–20)
Bilirubin Total: 0.2 mg/dL (ref 0.0–1.2)
CO2: 17 mmol/L — ABNORMAL LOW (ref 20–29)
Calcium: 9.6 mg/dL (ref 8.7–10.2)
Chloride: 103 mmol/L (ref 96–106)
Creatinine, Ser: 0.83 mg/dL (ref 0.57–1.00)
Globulin, Total: 2.5 g/dL (ref 1.5–4.5)
Glucose: 109 mg/dL — ABNORMAL HIGH (ref 70–99)
Potassium: 3.7 mmol/L (ref 3.5–5.2)
Sodium: 139 mmol/L (ref 134–144)
Total Protein: 6.7 g/dL (ref 6.0–8.5)
eGFR: 95 mL/min/1.73 (ref >59)

## 2023-10-07 LAB — CBC WITH DIFFERENTIAL/PLATELET
Basophils Absolute: 0 x10E3/uL (ref 0.0–0.2)
Basos: 0 %
EOS (ABSOLUTE): 0.2 x10E3/uL (ref 0.0–0.4)
Eos: 2 %
Hematocrit: 36.4 % (ref 34.0–46.6)
Hemoglobin: 12.2 g/dL (ref 11.1–15.9)
Immature Grans (Abs): 0 x10E3/uL (ref 0.0–0.1)
Immature Granulocytes: 0 %
Lymphocytes Absolute: 2.8 x10E3/uL (ref 0.7–3.1)
Lymphs: 31 %
MCH: 31.4 pg (ref 26.6–33.0)
MCHC: 33.5 g/dL (ref 31.5–35.7)
MCV: 94 fL (ref 79–97)
Monocytes Absolute: 0.8 x10E3/uL (ref 0.1–0.9)
Monocytes: 9 %
Neutrophils Absolute: 5.2 x10E3/uL (ref 1.4–7.0)
Neutrophils: 58 %
Platelets: 256 x10E3/uL (ref 150–450)
RBC: 3.88 x10E6/uL (ref 3.77–5.28)
RDW: 12.7 % (ref 11.7–15.4)
WBC: 9 x10E3/uL (ref 3.4–10.8)

## 2023-10-07 LAB — TSH: TSH: 1.23 u[IU]/mL (ref 0.450–4.500)

## 2023-10-07 LAB — LIPID PANEL W/O CHOL/HDL RATIO
Cholesterol, Total: 167 mg/dL (ref 100–199)
HDL: 57 mg/dL (ref >39)
LDL Chol Calc (NIH): 92 mg/dL (ref 0–99)
Triglycerides: 99 mg/dL (ref 0–149)
VLDL Cholesterol Cal: 18 mg/dL (ref 5–40)

## 2023-10-14 ENCOUNTER — Ambulatory Visit: Payer: Self-pay | Admitting: Family Medicine

## 2023-10-14 ENCOUNTER — Ambulatory Visit

## 2023-10-14 ENCOUNTER — Ambulatory Visit: Admitting: Nurse Practitioner
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
## 2023-10-21 ENCOUNTER — Ambulatory Visit

## 2023-11-02 ENCOUNTER — Telehealth: Admitting: Physician Assistant

## 2023-11-02 ENCOUNTER — Ambulatory Visit: Admitting: Family Medicine

## 2023-11-02 ENCOUNTER — Telehealth: Payer: Self-pay

## 2023-11-02 DIAGNOSIS — N760 Acute vaginitis: Secondary | ICD-10-CM

## 2023-11-02 DIAGNOSIS — B9689 Other specified bacterial agents as the cause of diseases classified elsewhere: Secondary | ICD-10-CM

## 2023-11-02 MED ORDER — METRONIDAZOLE 0.75 % VA GEL: 1.0000 | Freq: Every day | VAGINAL | 0 refills | Status: AC

## 2023-11-02 NOTE — Telephone Encounter (Signed)
 Copied from CRM [id number]. Topic: Clinical - Medical Advice >> Nov 02, 2023  9:41 AM Mia F wrote: Reason for CRM: Pt states she believe she has BV again. She says she is having a lot of itching and burning and feels swollen. NT was advised but pt declined. Pt wants to be seen today after work. She is currently scheduled for 10:20 today (appt was previously made) but says she cannot keep appt because she has to work. Provider does not have anything else this evening. She said if she cannot see her doctor can the doctor just send her in the BV medication. She does not want to speak with NT because she already know what it is. Tried looking to see if another provider could see pt but she hung up. Please advise >> Nov 02, 2023  9:49 AM Tobias L wrote: Patient called back to check on previous message. Patient requesting callback as soon as possible.

## 2023-11-02 NOTE — Telephone Encounter (Signed)
 Patient was seeking an early or late afternoon appointment due to work schedule, no available appointments at any [facility]. Patient will attempt to schedule a E Visit through My Chart.

## 2023-11-02 NOTE — Telephone Encounter (Signed)
 Called patient to schedule appt. [facility] full. Will call patient next business day.

## 2023-11-02 NOTE — Progress Notes (Signed)
 E-Visit for Vaginal Symptoms  We are sorry that you are not feeling well. Here is how we plan to help! Based on what you shared with me it looks like you: May have a vaginosis due to bacteria  Vaginosis is an inflammation of the vagina that can result in discharge, itching and pain. The cause is usually a change in the normal balance of vaginal bacteria or an infection. Vaginosis can also result from reduced estrogen levels after menopause.  The most common causes of vaginosis are:   Bacterial vaginosis which results from an overgrowth of one on several organisms that are normally present in your vagina.   Yeast infections which are caused by a naturally occurring fungus called candida.   Vaginal atrophy (atrophic vaginosis) which results from the thinning of the vagina from reduced estrogen levels after menopause.   Trichomoniasis which is caused by a parasite and is commonly transmitted by sexual intercourse.  Factors that increase your risk of developing vaginosis include: Medications, such as antibiotics and steroids Uncontrolled diabetes Use of hygiene products such as bubble bath, vaginal spray or vaginal deodorant Douching Wearing damp or tight-fitting clothing Using an intrauterine device (IUD) for birth control Hormonal changes, such as those associated with pregnancy, birth control pills or menopause Sexual activity Having a sexually transmitted infection  Your treatment plan is Metronidazole  vaginal gel Insert one applicatortip ful nightly at bedtime for 7 days.  I have electronically sent this prescription into the pharmacy that you have chosen.  Be sure to take all of the medication as directed. Stop taking any medication if you develop a rash, tongue swelling or shortness of breath. Mothers who are breast feeding should consider pumping and discarding their breast milk while on these antibiotics. However, there is no consensus that infant exposure at these doses would be  harmful.  Remember that medication creams can weaken latex condoms. SABRA   HOME CARE:  Good hygiene may prevent some types of vaginosis from recurring and may relieve some symptoms:  Avoid baths, hot tubs and whirlpool spas. Rinse soap from your outer genital area after a shower, and dry the area well to prevent irritation. Don't use scented or harsh soaps, such as those with deodorant or antibacterial action. Avoid irritants. These include scented tampons and pads. Wipe from front to back after using the toilet. Doing so avoids spreading fecal bacteria to your vagina.  Other things that may help prevent vaginosis include:  Don't douche. Your vagina doesn't require cleansing other than normal bathing. Repetitive douching disrupts the normal organisms that reside in the vagina and can actually increase your risk of vaginal infection. Douching won't clear up a vaginal infection. Use a latex condom. Both female and female latex condoms may help you avoid infections spread by sexual contact. Wear cotton underwear. Also wear pantyhose with a cotton crotch. If you feel comfortable without it, skip wearing underwear to bed. Yeast thrives in [employer] Your symptoms should improve in the next day or two.  GET HELP RIGHT AWAY IF:  You have pain in your lower abdomen ( pelvic area or over your ovaries) You develop nausea or vomiting You develop a fever Your discharge changes or worsens You have persistent pain with intercourse You develop shortness of breath, a rapid pulse, or you faint.  These symptoms could be signs of problems or infections that need to be evaluated by a medical provider now.  MAKE SURE YOU   Understand these instructions. Will watch your condition. Will  get help right away if you are not doing well or get worse.  Thank you for choosing an e-visit.  Your e-visit answers were reviewed by a board certified advanced clinical practitioner to complete your personal care  plan. Depending upon the condition, your plan could have included both over the counter or prescription medications.  Please review your pharmacy choice. Make sure the pharmacy is open so you can pick up prescription now. If there is a problem, you may contact your provider through [employer] and have the prescription routed to another pharmacy.  Your safety is important to us . If you have drug allergies check your prescription carefully.   For the next 24 hours you can use MyChart to ask questions about today's visit, request a non-urgent call back, or ask for a work or school excuse. You will get an email in the next two days asking about your experience. I hope that your e-visit has been valuable and will speed your recovery.    I have spent 5 minutes in review of e-visit questionnaire, review and updating patient chart, medical decision making and response to patient.   Brittany Page Dickinson, PA-C

## 2023-11-03 NOTE — Telephone Encounter (Signed)
 Appt completed on 11-02-23

## 2023-11-13 ENCOUNTER — Encounter: Payer: Self-pay | Admitting: Nurse Practitioner

## 2023-11-13 ENCOUNTER — Ambulatory Visit: Admitting: Nurse Practitioner

## 2023-11-13 ENCOUNTER — Ambulatory Visit: Payer: Self-pay

## 2023-11-13 VITALS — BP 112/75 | HR 77 | Temp 98.4°F | Ht 64.7 in | Wt 195.0 lb

## 2023-11-13 DIAGNOSIS — G8929 Other chronic pain: Secondary | ICD-10-CM | POA: Insufficient documentation

## 2023-11-13 DIAGNOSIS — M545 Low back pain, unspecified: Secondary | ICD-10-CM | POA: Insufficient documentation

## 2023-11-13 DIAGNOSIS — M549 Dorsalgia, unspecified: Secondary | ICD-10-CM | POA: Insufficient documentation

## 2023-11-13 DIAGNOSIS — M5489 Other dorsalgia: Secondary | ICD-10-CM | POA: Diagnosis not present

## 2023-11-13 MED ORDER — PREDNISONE 10 MG PO TABS: ORAL_TABLET | ORAL | 0 refills | Status: DC
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
## 2023-11-13 NOTE — Patient Instructions (Signed)
 [facility] is where imaging is, this is across the street from [place] on [address] in [place]  Acute Back Pain, Adult Acute back pain is sudden and usually short-lived. It is often caused by an injury to the muscles and tissues in the back. The injury may result from: A muscle, tendon, or ligament getting overstretched or torn. Ligaments are tissues that connect bones to each other. Lifting something improperly can cause a back strain. Wear and tear (degeneration) of the spinal disks. Spinal disks are circular tissue that provide cushioning between the bones of the spine (vertebrae). Twisting motions, such as while playing sports or doing yard work. A hit to the back. Arthritis. You may have a physical exam, lab tests, and imaging tests to find the cause of your pain. Acute back pain usually goes away with rest and home care. Follow these instructions at home: Managing pain, stiffness, and swelling Take over-the-counter and prescription medicines only as told by your health care provider. Treatment may include medicines for pain and inflammation that are taken by mouth or applied to the skin, or muscle relaxants. Your health care provider may recommend applying ice during the first 24-48 hours after your pain starts. To do this: Put ice in a plastic bag. Place a towel between your skin and the bag. Leave the ice on for 20 minutes, 2-3 times a day. Remove the ice if your skin turns bright red. This is very important. If you cannot feel pain, heat, or cold, you have a greater risk of damage to the area. If directed, apply heat to the affected area as often as told by your health care provider. Use the heat source that your health care provider recommends, such as a moist heat pack or a heating pad. Place a towel between your skin and the heat source. Leave the heat on for 20-30 minutes. Remove the heat if your skin turns bright red. This is especially important if you are unable to feel  pain, heat, or cold. You have a greater risk of getting burned. Activity  Do not stay in bed. Staying in bed for more than 1-2 days can delay your recovery. Sit up and stand up straight. Avoid leaning forward when you sit or hunching over when you stand. If you work at a desk, sit close to it so you do not need to lean over. Keep your chin tucked in. Keep your neck drawn back, and keep your elbows bent at a 90-degree angle (right angle). Sit high and close to the steering wheel when you drive. Add lower back (lumbar) support to your car seat, if needed. Take short walks on even surfaces as soon as you are able. Try to increase the length of time you walk each day. Do not sit, drive, or stand in one place for more than 30 minutes at a time. Sitting or standing for long periods of time can put stress on your back. Do not drive or use heavy machinery while taking prescription pain medicine. Use proper lifting techniques. When you bend and lift, use positions that put less stress on your back: [place] your knees. Keep the load close to your body. Avoid twisting. Exercise regularly as told by your health care provider. Exercising helps your back heal faster and helps prevent back injuries by keeping muscles strong and flexible. Work with a physical therapist to make a safe exercise program, as recommended by your health care provider. Do any exercises as told by your physical  therapist. Lifestyle Maintain a healthy weight. Extra weight puts stress on your back and makes it difficult to have good posture. Avoid activities or situations that make you feel anxious or stressed. Stress and anxiety increase muscle tension and can make back pain worse. Learn ways to manage anxiety and stress, such as through exercise. General instructions Sleep on a firm mattress in a comfortable position. Try lying on your side with your knees slightly bent. If you lie on your back, put a pillow under your knees. Keep your  head and neck in a straight line with your spine (neutral position) when using electronic equipment like smartphones or pads. To do this: Raise your smartphone or pad to look at it instead of bending your head or neck to look down. Put the smartphone or pad at the level of your face while looking at the screen. Follow your treatment plan as told by your health care provider. This may include: Cognitive or behavioral therapy. Acupuncture or massage therapy. Meditation or yoga. Contact a health care provider if: You have pain that is not relieved with rest or medicine. You have increasing pain going down into your legs or buttocks. Your pain does not improve after 2 weeks. You have pain at night. You lose weight without trying. You have a fever or chills. You develop nausea or vomiting. You develop abdominal pain. Get help right away if: You develop new bowel or bladder control problems. You have unusual weakness or numbness in your arms or legs. You feel faint. These symptoms may represent a serious problem that is an emergency. Do not wait to see if the symptoms will go away. Get medical help right away. Call your local emergency services (911 in the [place] Do not drive yourself to the hospital. Summary Acute back pain is sudden and usually short-lived. Use proper lifting techniques. When you bend and lift, use positions that put less stress on your back. Take over-the-counter and prescription medicines only as told by your health care provider, and apply heat or ice as told. This information is not intended to replace advice given to you by your health care provider. Make sure you discuss any questions you have with your health care provider. Document Revised: 06/01/2020 Document Reviewed: 06/01/2020 Elsevier Patient Education  2024 [employer].
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
## 2023-11-16 ENCOUNTER — Ambulatory Visit
Admission: RE | Admit: 2023-11-16 | Discharge: 2023-11-16 | Disposition: A | Discharge status: Home / Self Care | Source: Ambulatory Visit | Attending: Nurse Practitioner | Admitting: Nurse Practitioner

## 2023-11-16 DIAGNOSIS — M5489 Other dorsalgia: Secondary | ICD-10-CM | POA: Diagnosis present

## 2023-11-24 ENCOUNTER — Ambulatory Visit: Payer: Self-pay | Admitting: Nurse Practitioner

## 2023-11-24 DIAGNOSIS — M542 Cervicalgia: Secondary | ICD-10-CM

## 2023-11-24 DIAGNOSIS — M545 Low back pain, unspecified: Secondary | ICD-10-CM

## 2023-11-24 NOTE — Progress Notes (Signed)
 Contacted via MyChart  Good afternoon Brittany Page, your imaging has returned and mid back and neck overall normal on imaging.  Lower back was showing some abnormality which may have been related to a muscle spasm at the time. I would recommend if pain continues we consider physical therapy.  Any questions?

## 2023-11-26 ENCOUNTER — Telehealth: Payer: Self-pay | Admitting: Family Medicine

## 2023-11-26 NOTE — Telephone Encounter (Signed)
 Pending patient to return the questionnaire/consent in order to proceed.

## 2023-11-26 NOTE — Telephone Encounter (Signed)
 Patient dropped off FMLA Forms to be filled out by provider. Patient is requesting a call back at [phone] within 2-5 days when completed. Document is located in providers folder.   FYI: Patient request completed form be faxed to number on form, than call to pick up.

## 2023-12-07 ENCOUNTER — Ambulatory Visit: Admitting: Family Medicine

## 2023-12-17 ENCOUNTER — Encounter: Payer: Self-pay | Admitting: Family Medicine

## 2023-12-17 ENCOUNTER — Ambulatory Visit: Admitting: Family Medicine

## 2023-12-17 VITALS — BP 129/79 | HR 81 | Temp 97.8°F | Ht 65.0 in | Wt 198.0 lb

## 2023-12-17 DIAGNOSIS — N760 Acute vaginitis: Secondary | ICD-10-CM

## 2023-12-17 DIAGNOSIS — B9689 Other specified bacterial agents as the cause of diseases classified elsewhere: Secondary | ICD-10-CM

## 2023-12-17 DIAGNOSIS — Z7251 High risk heterosexual behavior: Secondary | ICD-10-CM | POA: Diagnosis not present

## 2023-12-17 LAB — PREGNANCY, URINE: Preg Test, Ur: NEGATIVE

## 2023-12-17 LAB — WET PREP FOR TRICH, YEAST, CLUE
Clue Cell Exam: POSITIVE — AB
Trichomonas Exam: NEGATIVE
Yeast Exam: NEGATIVE

## 2023-12-17 MED ORDER — CLINDAMYCIN PHOSPHATE 2 % VA CREA: 1.0000 | TOPICAL_CREAM | Freq: Every day | VAGINAL | 0 refills | Status: DC
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
## 2023-12-18 ENCOUNTER — Ambulatory Visit: Payer: Self-pay | Admitting: Family Medicine

## 2023-12-18 LAB — ACUTE VIRAL HEPATITIS (HAV, HBV, HCV)
HCV Ab: NONREACTIVE
Hep A IgM: NEGATIVE
Hep B C IgM: NEGATIVE
Hepatitis B Surface Ag: NEGATIVE

## 2023-12-18 LAB — HIV ANTIBODY (ROUTINE TESTING W REFLEX): HIV Screen 4th Generation wRfx: NONREACTIVE

## 2023-12-18 LAB — HSV 1 AND 2 AB, IGG
HSV 1 Glycoprotein G Ab, IgG: NONREACTIVE
HSV 2 IgG, Type Spec: REACTIVE — AB

## 2023-12-18 LAB — BETA HCG QUANT (REF LAB): hCG Quant: <1 m[IU]/mL

## 2023-12-18 LAB — HCV INTERPRETATION

## 2023-12-18 LAB — RPR W/REFLEX TO TREPSURE

## 2023-12-20 LAB — GC/CHLAMYDIA PROBE AMP
Chlamydia trachomatis, NAA: NEGATIVE
Neisseria Gonorrhoeae by PCR: NEGATIVE

## 2023-12-20 LAB — TREPONEMAL ANTIBODIES, TPPA: Treponemal Antibodies, TPPA: NONREACTIVE

## 2023-12-20 LAB — RPR W/REFLEX TO TREPSURE: RPR: NONREACTIVE

## 2024-01-05 ENCOUNTER — Encounter: Payer: Self-pay | Admitting: Nurse Practitioner

## 2024-01-05 NOTE — Telephone Encounter (Signed)
 Please schedule patient with me.

## 2024-01-06 NOTE — Telephone Encounter (Signed)
 Scheduled

## 2024-01-11 ENCOUNTER — Encounter: Payer: Self-pay | Admitting: Family Medicine

## 2024-01-11 ENCOUNTER — Ambulatory Visit (INDEPENDENT_AMBULATORY_CARE_PROVIDER_SITE_OTHER): Admitting: Family Medicine

## 2024-01-11 VITALS — BP 119/84 | HR 72 | Temp 98.0°F | Ht 65.0 in | Wt 196.8 lb

## 2024-01-11 DIAGNOSIS — N39490 Overflow incontinence: Secondary | ICD-10-CM

## 2024-01-11 DIAGNOSIS — M545 Low back pain, unspecified: Secondary | ICD-10-CM

## 2024-01-11 DIAGNOSIS — G8929 Other chronic pain: Secondary | ICD-10-CM | POA: Diagnosis not present

## 2024-01-11 NOTE — Assessment & Plan Note (Signed)
 Will get her back into PT. Continue to monitor. Call with any concerns. Continue to monitor. Follow up in about a month.
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
## 2024-01-21 ENCOUNTER — Telehealth: Admitting: Physician Assistant

## 2024-01-21 DIAGNOSIS — B9689 Other specified bacterial agents as the cause of diseases classified elsewhere: Secondary | ICD-10-CM | POA: Diagnosis not present

## 2024-01-21 DIAGNOSIS — N760 Acute vaginitis: Secondary | ICD-10-CM | POA: Diagnosis not present

## 2024-01-21 MED ORDER — CLINDAMYCIN PHOSPHATE 2 % VA CREA: 1.0000 | TOPICAL_CREAM | Freq: Every day | VAGINAL | 0 refills | Status: DC

## 2024-01-21 NOTE — Progress Notes (Signed)
 We are sorry that you are not feeling well. Here is how we plan to help! Based on what you shared with me it looks like you: May have a vaginosis due to bacteria  Vaginosis is an inflammation of the vagina that can result in discharge, itching and pain. The cause is usually a change in the normal balance of vaginal bacteria or an infection. Vaginosis can also result from reduced estrogen levels after menopause.  The most common causes of vaginosis are:   Bacterial vaginosis which results from an overgrowth of one on several organisms that are normally present in your vagina.   Yeast infections which are caused by a naturally occurring fungus called candida.   Vaginal atrophy (atrophic vaginosis) which results from the thinning of the vagina from reduced estrogen levels after menopause.   Trichomoniasis which is caused by a parasite and is commonly transmitted by sexual intercourse.  Factors that increase your risk of developing vaginosis include: Medications, such as antibiotics and steroids Uncontrolled diabetes Use of hygiene products such as bubble bath, vaginal spray or vaginal deodorant Douching Wearing damp or tight-fitting clothing Using an intrauterine device (IUD) for birth control Hormonal changes, such as those associated with pregnancy, birth control pills or menopause Sexual activity Having a sexually transmitted infection  Your treatment plan is Clindamycin  vaginal cream 5 grams applied vaginally for 7 days.  I have electronically sent this prescription into the pharmacy that you have chosen.  Be sure to take all of the medication as directed. Stop taking any medication if you develop a rash, tongue swelling or shortness of breath. Mothers who are breast feeding should consider pumping and discarding their breast milk while on these antibiotics. However, there is no consensus that infant exposure at these doses would be harmful.  Remember that medication creams can weaken  latex condoms.   HOME CARE:  Good hygiene may prevent some types of vaginosis from recurring and may relieve some symptoms:  Avoid baths, hot tubs and whirlpool spas. Rinse soap from your outer genital area after a shower, and dry the area well to prevent irritation. Don't use scented or harsh soaps, such as those with deodorant or antibacterial action. Avoid irritants. These include scented tampons and pads. Wipe from front to back after using the toilet. Doing so avoids spreading fecal bacteria to your vagina.  Other things that may help prevent vaginosis include:  Don't douche. Your vagina doesn't require cleansing other than normal bathing. Repetitive douching disrupts the normal organisms that reside in the vagina and can actually increase your risk of vaginal infection. Douching won't clear up a vaginal infection. Use a latex condom. Both female and female latex condoms may help you avoid infections spread by sexual contact. Wear cotton underwear. Also wear pantyhose with a cotton crotch. If you feel comfortable without it, skip wearing underwear to bed. Yeast thrives in [employer] Your symptoms should improve in the next day or two.  GET HELP RIGHT AWAY IF:  You have pain in your lower abdomen ( pelvic area or over your ovaries) You develop nausea or vomiting You develop a fever Your discharge changes or worsens You have persistent pain with intercourse You develop shortness of breath, a rapid pulse, or you faint.  These symptoms could be signs of problems or infections that need to be evaluated by a medical provider now.  MAKE SURE YOU   Understand these instructions. Will watch your condition. Will get help right away if you are not doing  well or get worse.  Your e-visit answers were reviewed by a board certified advanced clinical practitioner to complete your personal care plan. Depending upon the condition, your plan could have included both over the counter or  prescription medications. Please review your pharmacy choice to make sure that you have choses a pharmacy that is open for you to pick up any needed prescription, Your safety is important to us . If you have drug allergies check your prescription carefully.   You can use MyChart to ask questions about today's visit, request a non-urgent call back, or ask for a work or school excuse for 24 hours related to this e-Visit. If it has been greater than 24 hours you will need to follow up with your provider, or enter a new e-Visit to address those concerns. You will get a MyChart message within the next two days asking about your experience. I hope that your e-visit has been valuable and will speed your recovery.  I have spent 5 minutes in review of e-visit questionnaire, review and updating patient chart, medical decision making and response to patient.   Elsie Velma Lunger, PA-C

## 2024-02-03 ENCOUNTER — Telehealth: Admitting: Physician Assistant

## 2024-02-03 DIAGNOSIS — N760 Acute vaginitis: Secondary | ICD-10-CM

## 2024-02-03 NOTE — Progress Notes (Signed)
  Because of recurrent vaginitis and need for prolonged treatments, I feel your condition warrants further evaluation and I recommend that you be seen in a face-to-face visit.   NOTE: There will be NO CHARGE for this E-Visit   If you are having a true medical emergency, please call 911.     For an urgent face to face visit, [facility] has multiple urgent care centers for your convenience.  Click the link below for the full list of locations and hours, walk-in wait times, appointment scheduling options and driving directions:  Urgent Care - [place], [place], [place], [place], [place], [place]  [place]     Your MyChart E-visit questionnaire answers were reviewed by a board certified advanced clinical practitioner to complete your personal care plan based on your specific symptoms.    Thank you for using e-Visits.

## 2024-02-04 ENCOUNTER — Other Ambulatory Visit: Payer: Self-pay | Admitting: Medical Genetics

## 2024-02-08 ENCOUNTER — Other Ambulatory Visit

## 2024-02-10 ENCOUNTER — Ambulatory Visit: Admitting: Family Medicine

## 2024-02-10 ENCOUNTER — Encounter: Payer: Self-pay | Admitting: Family Medicine

## 2024-02-10 VITALS — BP 124/85 | HR 98 | Temp 98.0°F | Ht 65.0 in | Wt 200.8 lb

## 2024-02-10 DIAGNOSIS — G8929 Other chronic pain: Secondary | ICD-10-CM | POA: Diagnosis not present

## 2024-02-10 DIAGNOSIS — B9689 Other specified bacterial agents as the cause of diseases classified elsewhere: Secondary | ICD-10-CM

## 2024-02-10 DIAGNOSIS — N760 Acute vaginitis: Secondary | ICD-10-CM

## 2024-02-10 DIAGNOSIS — M546 Pain in thoracic spine: Secondary | ICD-10-CM

## 2024-02-10 DIAGNOSIS — M545 Low back pain, unspecified: Secondary | ICD-10-CM

## 2024-02-10 LAB — WET PREP FOR TRICH, YEAST, CLUE
Clue Cell Exam: POSITIVE — AB
Trichomonas Exam: NEGATIVE
Yeast Exam: NEGATIVE

## 2024-02-10 MED ORDER — CLINDAMYCIN PHOSPHATE 2 % VA CREA
1.0000 | TOPICAL_CREAM | Freq: Every day | VAGINAL | 0 refills | Status: AC
Start: 2024-02-10 — End: ?

## 2024-02-10 NOTE — Assessment & Plan Note (Signed)
 Better, but not significantly with PT. Continue PT. Seems to be more muscular- will hold on MRI at this time and get her into sports med. Await their input.

## 2024-02-10 NOTE — Assessment & Plan Note (Signed)
 Chronic and recurrent. Will send to GYN and treat with clindamycin  gel. Call with any concerns.
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
## 2024-02-12 ENCOUNTER — Other Ambulatory Visit

## 2024-02-12 ENCOUNTER — Ambulatory Visit: Admitting: Family Medicine

## 2024-02-15 ENCOUNTER — Encounter: Payer: Self-pay | Admitting: Family Medicine

## 2024-02-15 ENCOUNTER — Ambulatory Visit: Admitting: Family Medicine

## 2024-02-15 VITALS — BP 110/60 | HR 83 | Ht 65.0 in | Wt 204.0 lb

## 2024-02-15 DIAGNOSIS — G8929 Other chronic pain: Secondary | ICD-10-CM | POA: Diagnosis not present

## 2024-02-15 DIAGNOSIS — M545 Low back pain, unspecified: Secondary | ICD-10-CM

## 2024-02-15 MED ORDER — DICLOFENAC SODIUM 75 MG PO TBEC: 75.0000 mg | DELAYED_RELEASE_TABLET | Freq: Two times a day (BID) | ORAL | 1 refills | Status: DC | PRN

## 2024-02-15 MED ORDER — CYCLOBENZAPRINE HCL 5 MG PO TABS: 5.0000 mg | ORAL_TABLET | Freq: Three times a day (TID) | ORAL | 0 refills | Status: DC | PRN

## 2024-02-17 ENCOUNTER — Telehealth: Payer: Self-pay | Admitting: Family Medicine
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
## 2024-02-17 NOTE — Telephone Encounter (Signed)
 Copied from CRM [id number]. Topic: General - Other >> Feb 17, 2024 10:33 AM Avram MATSU wrote: Reason for CRM: [facility], would like to thank MD Donnice for prescribing her medication and it has been helping her a lot with PT and it was a great supplement to her care. Please advise [phone] option 1   cyclobenzaprine  (FLEXERIL ) 5 MG tablet [[id number]] diclofenac  (VOLTAREN ) 75 MG EC tablet [[id number]]

## 2024-02-17 NOTE — Telephone Encounter (Signed)
 Please review.  KP
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
## 2024-02-26 ENCOUNTER — Telehealth: Admitting: Family Medicine

## 2024-02-26 DIAGNOSIS — B3731 Acute candidiasis of vulva and vagina: Secondary | ICD-10-CM

## 2024-02-26 MED ORDER — FLUCONAZOLE 150 MG PO TABS: 150.0000 mg | ORAL_TABLET | Freq: Every day | ORAL | 0 refills | Status: AC

## 2024-02-26 NOTE — Progress Notes (Signed)

## 2024-03-10 ENCOUNTER — Other Ambulatory Visit: Payer: Self-pay

## 2024-03-10 ENCOUNTER — Telehealth: Payer: Self-pay

## 2024-03-10 DIAGNOSIS — M545 Low back pain, unspecified: Secondary | ICD-10-CM

## 2024-03-10 NOTE — Telephone Encounter (Signed)
 PT order placed.

## 2024-03-10 NOTE — Telephone Encounter (Unsigned)
 Copied from CRM #[id number]. Topic: Referral - Request for Referral >> Mar 10, 2024  1:38 PM Olam RAMAN wrote: Did the patient discuss referral with their provider in the last year? Yes (If No - schedule appointment) (If Yes - send message)  Appointment offered? No  Type of order/referral and detailed reason for visit: PT  Preference of office, provider, location: n/a  If referral order, have you been seen by this specialty before? No (If Yes, this issue or another issue? When? Where?  Can we respond through MyChart? Yes

## 2024-03-10 NOTE — Progress Notes (Signed)
 Physical Therapy order placed for lumbar spine.  JM

## 2024-04-04 ENCOUNTER — Telehealth: Payer: Self-pay | Admitting: Family Medicine

## 2024-04-04 NOTE — Telephone Encounter (Signed)
 On this date 04/04/2024  paperwork has been received on patient's behalf. The paperwork has been received via fax to our office.. The paperwork that has been received is FMLA.   Paperwork to be returned via Fax.   Paperwork to be placed in providers incoming folder to be worked by their team. Jud is expected to be completed and returned 5-7 business days.

## 2024-04-11 NOTE — Telephone Encounter (Signed)
 Have you seen these forms for the patient?

## 2024-04-12 ENCOUNTER — Ambulatory Visit: Admitting: Family Medicine

## 2024-04-13 NOTE — Telephone Encounter (Signed)
 Placed in Dj's folder.

## 2024-04-18 ENCOUNTER — Ambulatory Visit: Admitting: Family Medicine

## 2024-04-19 NOTE — Telephone Encounter (Signed)
 Faxed back to [employer]. Mychart message sent to patient with update. Copies made. One placed in scan bin and patient pick up.

## 2024-04-21 ENCOUNTER — Encounter: Payer: Self-pay | Admitting: Family Medicine

## 2024-04-21 ENCOUNTER — Ambulatory Visit: Admitting: Family Medicine

## 2024-04-21 VITALS — BP 106/60 | HR 89 | Ht 65.0 in | Wt 201.0 lb

## 2024-04-21 DIAGNOSIS — G8929 Other chronic pain: Secondary | ICD-10-CM | POA: Diagnosis not present

## 2024-04-21 DIAGNOSIS — M542 Cervicalgia: Secondary | ICD-10-CM | POA: Insufficient documentation

## 2024-04-21 DIAGNOSIS — M9901 Segmental and somatic dysfunction of cervical region: Secondary | ICD-10-CM | POA: Insufficient documentation

## 2024-04-21 DIAGNOSIS — M545 Low back pain, unspecified: Secondary | ICD-10-CM | POA: Diagnosis not present

## 2024-04-21 MED ORDER — CYCLOBENZAPRINE HCL 5 MG PO TABS: 5.0000 mg | ORAL_TABLET | Freq: Three times a day (TID) | ORAL | 0 refills | Status: AC | PRN

## 2024-04-21 MED ORDER — DICLOFENAC SODIUM 75 MG PO TBEC: 75.0000 mg | DELAYED_RELEASE_TABLET | Freq: Two times a day (BID) | ORAL | 1 refills | Status: AC | PRN

## 2024-04-21 NOTE — Progress Notes (Signed)
 "    Primary Care / Sports Medicine Office Visit  Patient Information:  Patient ID: Brittany Page, female DOB: 1991/01/04 Age: 34 y.o. MRN: [id number]   Brittany Page is a pleasant 34 y.o. female presenting with the following:  Chief Complaint  Patient presents with   Back Pain    Low back pain has improved. Pt has helped along with the diclofenac  and flexeril . Would like refills on meds.    Vitals:   04/21/24 1032  BP: 106/60  Pulse: 89  SpO2: 99%   Vitals:   04/21/24 1032  Weight: 201 lb (91.2 kg)  Height: 5' 5 (1.651 m)   Body mass index is 33.45 kg/m.  No results found.   Independent interpretation of notes and tests performed by another provider:   None  Procedures performed:   None  Pertinent History, Exam, Impression, and Recommendations:   Discussed the use of AI scribe software for clinical note transcription with the patient, who gave verbal consent to proceed.  History of Present Illness   Brittany Page is a 34 year old female with chronic spine pain who presents for follow-up of persistent axial pain after physical therapy and medication.  Axial Spine Pain: - Persistent pain involving cervical, thoracic, and lumbar spine since November 2025 - Pain quality is aching and occasionally sharp; aching pain persists after physical therapy, sharp pain occurs transiently - Most prominent in upper and mid back; low back pain less frequent unless provoked by therapy or exercise - Increased tightness in shoulders and upper back as lower back pain has improved - Pain generally well controlled when not engaging in therapy or exercise, but recurs with activity or after therapy sessions - No associated systemic symptoms  Physical Therapy and Activity Modification: - Participated in physical therapy with [facility] since August 2025; most recent progress documented December 2025 - Plans to pause formal physical therapy after last scheduled session on April 11, 2024  due to upcoming travel - Continues home exercise program approximately once per week - Avoids lifting heavy objects to prevent exacerbation of symptoms  Medication Use: - Takes diclofenac  once daily, despite being prescribed a higher dose, to limit medication use - Uses Flexeril  as needed, primarily at night or when not driving; effective for muscle relaxation and sleep  Anticipated Travel: - Planning a nine-hour car trip to [place]  in mid-February; interested in monitoring back symptoms during travel       Assessment and Plan    Chronic spine pain involving cervical, thoracic, and lumbar regions Chronic spine pain with partial improvement from therapy and medication, now affecting mid and upper back with muscle tightness. Concern for structural pathology like disc bulges or nerve impingement. - Ordered MRI of cervical, thoracic, and lumbar spine to evaluate for structural causes. - Increased diclofenac  to twice daily during increased activity or travel. - Advised Flexeril  as needed, especially at night or when not driving; discussed drowsiness risk. - Recommended pausing formal physical therapy until MRI results; continue home exercise program as tolerated. - If MRI shows structural pathology and pain persists, refer to interventional spine specialist for epidural steroid injection. - If MRI is normal, continue medical therapy and exercise, with further interventions based on response. - Instructed to avoid heavy lifting and activities exacerbating pain until further evaluation. - Advised to monitor for MRI scheduling and contact office for delays. - Will review MRI results via secure message and determine next steps, including possible referral to spine group.  Assessment & Plan Chronic lumbosacral pain  Orders:   cyclobenzaprine  (FLEXERIL ) 5 MG tablet; Take 1-2 tablets (5-10 mg total) by mouth 3 (three) times daily as needed.   diclofenac  (VOLTAREN ) 75 MG EC tablet; Take 1  tablet (75 mg total) by mouth 2 (two) times daily as needed.   MR Lumbar Spine Wo Contrast; Future  Cervicothoracic somatic dysfunction  Orders:   cyclobenzaprine  (FLEXERIL ) 5 MG tablet; Take 1-2 tablets (5-10 mg total) by mouth 3 (three) times daily as needed.   diclofenac  (VOLTAREN ) 75 MG EC tablet; Take 1 tablet (75 mg total) by mouth 2 (two) times daily as needed.   MR Thoracic Spine Wo Contrast; Future   MR Cervical Spine Wo Contrast; Future  Cervicalgia  Orders:   cyclobenzaprine  (FLEXERIL ) 5 MG tablet; Take 1-2 tablets (5-10 mg total) by mouth 3 (three) times daily as needed.   diclofenac  (VOLTAREN ) 75 MG EC tablet; Take 1 tablet (75 mg total) by mouth 2 (two) times daily as needed.   MR Cervical Spine Wo Contrast; Future   No follow-ups on file.     Selinda JINNY Ku, MD, [facility]   Primary Care Sports Medicine Primary Care and Sports Medicine at [facility]   "

## 2024-04-21 NOTE — Patient Instructions (Signed)
" °  VISIT SUMMARY: Today we discussed your chronic spine pain and its management. We reviewed your progress with physical therapy and medication, and planned further evaluation with an MRI.  YOUR PLAN: CHRONIC SPINE PAIN: Persistent pain in your cervical, thoracic, and lumbar spine, with muscle tightness in the upper and mid back. -We have ordered an MRI of your cervical, thoracic, and lumbar spine to check for any structural issues. -Increase diclofenac  to twice daily during periods of increased activity or travel. -Continue using Flexeril  as needed, especially at night or when not driving, but be aware of the risk of drowsiness. -Pause formal physical therapy until we get the MRI results, but continue your home exercise program as tolerated. -Avoid heavy lifting and activities that worsen your pain until further evaluation. -Monitor for MRI scheduling and contact our office if there are any delays. -We will review your MRI results via secure message and decide on the next steps, which may include a referral to a spine specialist if necessary.    Contains text generated by [employer].   "

## 2024-04-21 NOTE — Assessment & Plan Note (Signed)
" °  Orders:   cyclobenzaprine  (FLEXERIL ) 5 MG tablet; Take 1-2 tablets (5-10 mg total) by mouth 3 (three) times daily as needed.   diclofenac  (VOLTAREN ) 75 MG EC tablet; Take 1 tablet (75 mg total) by mouth 2 (two) times daily as needed.   MR Thoracic Spine Wo Contrast; Future   MR Cervical Spine Wo Contrast; Future  "

## 2024-04-21 NOTE — Assessment & Plan Note (Signed)
" °  Orders:   cyclobenzaprine  (FLEXERIL ) 5 MG tablet; Take 1-2 tablets (5-10 mg total) by mouth 3 (three) times daily as needed.   diclofenac  (VOLTAREN ) 75 MG EC tablet; Take 1 tablet (75 mg total) by mouth 2 (two) times daily as needed.   MR Cervical Spine Wo Contrast; Future  "

## 2024-04-21 NOTE — Assessment & Plan Note (Signed)
" °  Orders:   cyclobenzaprine  (FLEXERIL ) 5 MG tablet; Take 1-2 tablets (5-10 mg total) by mouth 3 (three) times daily as needed.   diclofenac  (VOLTAREN ) 75 MG EC tablet; Take 1 tablet (75 mg total) by mouth 2 (two) times daily as needed.   MR Lumbar Spine Wo Contrast; Future  "

## 2024-05-02 ENCOUNTER — Ambulatory Visit: Admitting: Family Medicine

## 2024-05-18 ENCOUNTER — Ambulatory Visit

## 2024-05-19 ENCOUNTER — Encounter: Admitting: Certified Nurse Midwife

## 2024-10-07 ENCOUNTER — Encounter: Admitting: Family Medicine
